# Patient Record
Sex: Female | Born: 1975 | Race: Black or African American | Hispanic: No | Marital: Single | Smoking: Never smoker
Health system: Southern US, Community
[De-identification: ages and names within clinical notes are randomized; demographics above are authoritative.]

## PROBLEM LIST (undated history)

## (undated) DIAGNOSIS — E785 Hyperlipidemia, unspecified: Secondary | ICD-10-CM

## (undated) DIAGNOSIS — F32A Depression, unspecified: Secondary | ICD-10-CM

## (undated) DIAGNOSIS — F329 Major depressive disorder, single episode, unspecified: Secondary | ICD-10-CM

## (undated) DIAGNOSIS — F419 Anxiety disorder, unspecified: Secondary | ICD-10-CM

## (undated) DIAGNOSIS — I10 Essential (primary) hypertension: Secondary | ICD-10-CM

## (undated) DIAGNOSIS — E119 Type 2 diabetes mellitus without complications: Secondary | ICD-10-CM

## (undated) HISTORY — DX: Essential (primary) hypertension: I10

## (undated) HISTORY — DX: Hyperlipidemia, unspecified: E78.5

## (undated) HISTORY — DX: Type 2 diabetes mellitus without complications: E11.9

## (undated) HISTORY — PX: OTHER SURGICAL HISTORY: SHX169

## (undated) HISTORY — DX: Major depressive disorder, single episode, unspecified: F32.9

## (undated) HISTORY — PX: TUBAL LIGATION: SHX77

## (undated) HISTORY — DX: Depression, unspecified: F32.A

## (undated) HISTORY — DX: Anxiety disorder, unspecified: F41.9

---

## 2009-04-21 ENCOUNTER — Emergency Department (HOSPITAL_COMMUNITY): Admission: EM | Admit: 2009-04-21 | Discharge: 2009-04-21 | Payer: Self-pay | Admitting: Emergency Medicine

## 2013-12-25 ENCOUNTER — Ambulatory Visit: Payer: Self-pay | Admitting: Family Medicine

## 2014-01-01 ENCOUNTER — Encounter: Payer: Self-pay | Admitting: Family Medicine

## 2014-01-01 ENCOUNTER — Ambulatory Visit (INDEPENDENT_AMBULATORY_CARE_PROVIDER_SITE_OTHER): Payer: 59 | Admitting: Family Medicine

## 2014-01-01 VITALS — BP 178/82 | HR 79 | Temp 98.2°F | Ht 63.0 in | Wt 222.5 lb

## 2014-01-01 DIAGNOSIS — E785 Hyperlipidemia, unspecified: Secondary | ICD-10-CM

## 2014-01-01 DIAGNOSIS — R945 Abnormal results of liver function studies: Secondary | ICD-10-CM

## 2014-01-01 DIAGNOSIS — Z136 Encounter for screening for cardiovascular disorders: Secondary | ICD-10-CM

## 2014-01-01 DIAGNOSIS — E119 Type 2 diabetes mellitus without complications: Secondary | ICD-10-CM

## 2014-01-01 DIAGNOSIS — I10 Essential (primary) hypertension: Secondary | ICD-10-CM

## 2014-01-01 DIAGNOSIS — R81 Glycosuria: Secondary | ICD-10-CM

## 2014-01-01 DIAGNOSIS — R7989 Other specified abnormal findings of blood chemistry: Secondary | ICD-10-CM

## 2014-01-01 LAB — COMPREHENSIVE METABOLIC PANEL
ALT: 104 U/L — AB (ref 0–35)
AST: 50 U/L — ABNORMAL HIGH (ref 0–37)
Albumin: 3.7 g/dL (ref 3.5–5.2)
Alkaline Phosphatase: 78 U/L (ref 39–117)
BILIRUBIN TOTAL: 0.4 mg/dL (ref 0.3–1.2)
BUN: 9 mg/dL (ref 6–23)
CALCIUM: 9.2 mg/dL (ref 8.4–10.5)
CO2: 26 mEq/L (ref 19–32)
Chloride: 100 mEq/L (ref 96–112)
Creatinine, Ser: 0.6 mg/dL (ref 0.4–1.2)
GFR: 141.03 mL/min (ref >60.00)
Glucose, Bld: 265 mg/dL — ABNORMAL HIGH (ref 70–99)
POTASSIUM: 3.9 meq/L (ref 3.5–5.1)
SODIUM: 136 meq/L (ref 135–145)
Total Protein: 7.6 g/dL (ref 6.0–8.3)

## 2014-01-01 LAB — LIPID PANEL
Cholesterol: 220 mg/dL — ABNORMAL HIGH (ref 0–200)
HDL: 57 mg/dL (ref >39.00)
LDL Cholesterol: 136 mg/dL — ABNORMAL HIGH (ref 0–99)
Total CHOL/HDL Ratio: 4
Triglycerides: 135 mg/dL (ref 0.0–149.0)
VLDL: 27 mg/dL (ref 0.0–40.0)

## 2014-01-01 LAB — HEMOGLOBIN A1C: HEMOGLOBIN A1C: 12.9 % — AB (ref 4.6–6.5)

## 2014-01-01 MED ORDER — LISINOPRIL-HYDROCHLOROTHIAZIDE 20-12.5 MG PO TABS: 1.0000 | ORAL_TABLET | Freq: Every day | ORAL | Status: DC

## 2014-01-01 NOTE — Assessment & Plan Note (Signed)
New.  Probable diabetic.  Go ahead and refer to diabetic nutritionist. Check a1c today before starting treatment as we may need to start with insulin if very high. The patient indicates understanding of these issues and agrees with the plan.

## 2014-01-01 NOTE — Progress Notes (Signed)
   Subjective:   Patient ID: Brittany [name], female    DOB: 03/17/1976, 38 y.o.   MRN: [id number]  Brittany [name] is a pleasant 38 y.o. year old female who presents to clinic today with Establish Care, Hypertension and glucose in urine  on 01/01/2014  HPI:  Went to UC on [address] 3 weeks for HA and dizziness.  BP was elevated and had glucosuria.  They told her to establish care with a PCP.  Strong FH of DM and HTN.  Today she does not have a HA.  She is not dizzy.  Overall feels ok today. Has had some increased urination but states that she drinks a lot of water.  Trying to work on diet- has already lost 8 pounds in 3 weeks.  Patient Active Problem List   Diagnosis Date Noted  . Glucosuria 01/01/2014  . HTN (hypertension) 01/01/2014   Past Medical History  Diagnosis Date  . Depression   . Hypertension UNSURE   Past Surgical History  Procedure Laterality Date  . Abdominal hysterectomy      2002   History  Substance Use Topics  . Smoking status: Never Smoker   . Smokeless tobacco: Never Used  . Alcohol Use: No   Family History  Problem Relation Age of Onset  . Diabetes Mother   . Hyperlipidemia Mother   . Diabetes Father   . Asthma Son   . Diabetes Paternal Grandmother   . Diabetes Paternal Grandfather   . Kidney disease Paternal Grandfather    No Known Allergies No current outpatient prescriptions on file prior to visit.   No current facility-administered medications on file prior to visit.   The PMH, PSH, Social History, Family History, Medications, and allergies have been reviewed in [facility], and have been updated if relevant.   Review of Systems See HPI No CP  No SOB No visual changes    Objective:    BP 178/82  Pulse 79  Temp(Src) 98.2 F (36.8 C) (Oral)  Ht 5\' 3"  (1.6 m)  Wt 222 lb 8 oz (100.925 kg)  BMI 39.42 kg/m2  SpO2 96%  LMP 12/07/2013   Physical Exam  Gen:  Obese, pleasant, NAD HEENT: MMM Resp:  CTA bilaterally CVS:   RRR Ext:  No edema      Assessment & Plan:   Glucosuria - Plan: Hemoglobin A1c  HTN (hypertension) - Plan: Comprehensive metabolic panel  Screening for ischemic heart disease - Plan: Lipid panel  Diabetes - Plan: Ambulatory referral to diabetic education Return in about 1 week (around 01/08/2014) for blood pressure recheck., medication follow up..Marland Kitchen

## 2014-01-01 NOTE — Assessment & Plan Note (Addendum)
Initially, BP was 201/100.  I rechecked it prior to giving a dose of clonidine and it had come down to 178/82.  Clonidine 0.1 mg given to her.  BP improved to 148/82.  Start lisinopril 20- HCTZ 12.5 mg daily. Check BMET. Follow up in 1 weeks.

## 2014-01-01 NOTE — Progress Notes (Signed)
Pre visit review using our clinic review tool, if applicable. No additional management support is needed unless otherwise documented below in the visit note. 

## 2014-01-01 NOTE — Patient Instructions (Signed)
Great to meet you. We are starting lisinopril- HCTZ- 1 tablet daily.   Come see in 1 week.  I will call you with your lab results.

## 2014-01-02 ENCOUNTER — Telehealth: Payer: Self-pay | Admitting: Family Medicine

## 2014-01-02 ENCOUNTER — Encounter: Payer: Self-pay | Admitting: Family Medicine

## 2014-01-02 DIAGNOSIS — R7989 Other specified abnormal findings of blood chemistry: Secondary | ICD-10-CM | POA: Insufficient documentation

## 2014-01-02 DIAGNOSIS — R945 Abnormal results of liver function studies: Secondary | ICD-10-CM

## 2014-01-02 DIAGNOSIS — E785 Hyperlipidemia, unspecified: Secondary | ICD-10-CM

## 2014-01-02 HISTORY — DX: Hyperlipidemia, unspecified: E78.5

## 2014-01-02 MED ORDER — ONETOUCH BASIC SYSTEM W/DEVICE KIT
PACK | Status: DC
Start: 2014-01-02 — End: 2014-01-05

## 2014-01-02 MED ORDER — METFORMIN HCL 500 MG PO TABS: 500.0000 mg | ORAL_TABLET | Freq: Two times a day (BID) | ORAL | Status: DC

## 2014-01-02 MED ORDER — SIMVASTATIN 10 MG PO TABS: 10.0000 mg | ORAL_TABLET | Freq: Every day | ORAL | Status: DC

## 2014-01-02 NOTE — Addendum Note (Signed)
Addended by: Dianne DunARON, Yilia Sacca M on: 01/02/2014 07:01 AM   Modules accepted: Orders

## 2014-01-02 NOTE — Telephone Encounter (Signed)
Relevant patient education assigned to patient using Emmi. ° °

## 2014-01-05 ENCOUNTER — Ambulatory Visit (INDEPENDENT_AMBULATORY_CARE_PROVIDER_SITE_OTHER): Payer: 59 | Admitting: Family Medicine

## 2014-01-05 ENCOUNTER — Encounter: Payer: Self-pay | Admitting: Family Medicine

## 2014-01-05 VITALS — BP 136/72 | HR 105 | Temp 97.4°F | Wt 218.5 lb

## 2014-01-05 DIAGNOSIS — R197 Diarrhea, unspecified: Secondary | ICD-10-CM

## 2014-01-05 DIAGNOSIS — R112 Nausea with vomiting, unspecified: Secondary | ICD-10-CM | POA: Insufficient documentation

## 2014-01-05 MED ORDER — ONDANSETRON HCL 4 MG PO TABS: 4.0000 mg | ORAL_TABLET | Freq: Three times a day (TID) | ORAL | Status: DC | PRN

## 2014-01-05 MED ORDER — ONETOUCH BASIC SYSTEM W/DEVICE KIT: PACK | Status: DC

## 2014-01-05 NOTE — Patient Instructions (Addendum)
Viral Gastroenteritis °Viral gastroenteritis is also known as stomach flu. This condition affects the stomach and intestinal tract. It can cause sudden diarrhea and vomiting. The illness typically lasts 3 to 8 days. Most people develop an immune response that eventually gets rid of the virus. While this natural response develops, the virus can make you quite ill. °CAUSES  °Many different viruses can cause gastroenteritis, such as rotavirus or noroviruses. You can catch one of these viruses by consuming contaminated food or water. You may also catch a virus by sharing utensils or other personal items with an infected person or by touching a contaminated surface. °SYMPTOMS  °The most common symptoms are diarrhea and vomiting. These problems can cause a severe loss of body fluids (dehydration) and a body salt (electrolyte) imbalance. Other symptoms may include: °· Fever. °· Headache. °· Fatigue. °· Abdominal pain. °DIAGNOSIS  °Your caregiver can usually diagnose viral gastroenteritis based on your symptoms and a physical exam. A stool sample may also be taken to test for the presence of viruses or other infections. °TREATMENT  °This illness typically goes away on its own. Treatments are aimed at rehydration. The most serious cases of viral gastroenteritis involve vomiting so severely that you are not able to keep fluids down. In these cases, fluids must be given through an intravenous line (IV). °HOME CARE INSTRUCTIONS  °· Drink enough fluids to keep your urine clear or pale yellow. Drink small amounts of fluids frequently and increase the amounts as tolerated. °· Ask your caregiver for specific rehydration instructions. °· Avoid: °¨ Foods high in sugar. °¨ Alcohol. °¨ Carbonated drinks. °¨ Tobacco. °¨ Juice. °¨ Caffeine drinks. °¨ Extremely hot or cold fluids. °¨ Fatty, greasy foods. °¨ Too much intake of anything at one time. °¨ Dairy products until 24 to 48 hours after diarrhea stops. °· You may consume probiotics.  Probiotics are active cultures of beneficial bacteria. They may lessen the amount and number of diarrheal stools in adults. Probiotics can be found in yogurt with active cultures and in supplements. °· Wash your hands well to avoid spreading the virus. °· Only take over-the-counter or prescription medicines for pain, discomfort, or fever as directed by your caregiver. Do not give aspirin to children. Antidiarrheal medicines are not recommended. °· Ask your caregiver if you should continue to take your regular prescribed and over-the-counter medicines. °· Keep all follow-up appointments as directed by your caregiver. °SEEK IMMEDIATE MEDICAL CARE IF:  °· You are unable to keep fluids down. °· You do not urinate at least once every 6 to 8 hours. °· You develop shortness of breath. °· You notice blood in your stool or vomit. This may look like coffee grounds. °· You have abdominal pain that increases or is concentrated in one small area (localized). °· You have persistent vomiting or diarrhea. °· You have a fever. °· The patient is a child younger than 3 months, and he or she has a fever. °· The patient is a child older than 3 months, and he or she has a fever and persistent symptoms. °· The patient is a child older than 3 months, and he or she has a fever and symptoms suddenly get worse. °· The patient is a baby, and he or she has no tears when crying. °MAKE SURE YOU:  °· Understand these instructions. °· Will watch your condition. °· Will get help right away if you are not doing well or get worse. °Document Released: 09/07/2005 Document Revised: 11/30/2011 Document Reviewed: 06/24/2011 °  ExitCare Patient Information 2014 [employer] [place].  Please stop taking your Metformin this weekend. Follow up with me on Wednesday, sooner if you feel worse.

## 2014-01-05 NOTE — Assessment & Plan Note (Signed)
Seems more likely infectious gastroenteritis. Advised hydration.  Go ahead and continue to hold Metformin since she is dehydrated.  She has follow up with me on Wednesday.  Ok to restart Metformin on Monday if symptoms resolved. The patient indicates understanding of these issues and agrees with the plan.

## 2014-01-05 NOTE — Progress Notes (Signed)
   Subjective:   Patient ID: Brittany [name], female    DOB: 10/30/1975, 38 y.o.   MRN: [id number]  Brittany [name] is a pleasant 38 y.o. year old female who presents to clinic today with Nausea and Diarrhea  on 01/05/2014  HPI: Started Metformin 500 mg twice daily yesterday.   Started feeling nauseated, no vomiting yet, and diarrhea yesterday afternoon.  Did not take Metformin today.   Feels worse today.  Has had 5 loose BMs today. Coworker has similar symptoms.   No blood or mucous in stool.  Has not checked FSBS yet.  Patient Active Problem List   Diagnosis Date Noted  . Nausea, vomiting, and diarrhea 01/05/2014  . Diabetes mellitus, new onset 01/02/2014  . HLD (hyperlipidemia) 01/02/2014  . Elevated liver function tests 01/02/2014  . Glucosuria 01/01/2014  . HTN (hypertension) 01/01/2014   Past Medical History  Diagnosis Date  . Depression   . Hypertension UNSURE  . HLD (hyperlipidemia) 01/02/2014   Past Surgical History  Procedure Laterality Date  . Abdominal hysterectomy      2002   History  Substance Use Topics  . Smoking status: Never Smoker   . Smokeless tobacco: Never Used  . Alcohol Use: No   Family History  Problem Relation Age of Onset  . Diabetes Mother   . Hyperlipidemia Mother   . Diabetes Father   . Asthma Son   . Diabetes Paternal Grandmother   . Diabetes Paternal Grandfather   . Kidney disease Paternal Grandfather    No Known Allergies Current Outpatient Prescriptions on File Prior to Visit  Medication Sig Dispense Refill  . lisinopril-hydrochlorothiazide (ZESTORETIC) 20-12.5 MG per tablet Take 1 tablet by mouth daily.  30 tablet  0  . metFORMIN (GLUCOPHAGE) 500 MG tablet Take 1 tablet (500 mg total) by mouth 2 (two) times daily with a meal.  180 tablet  3  . simvastatin (ZOCOR) 10 MG tablet Take 1 tablet (10 mg total) by mouth at bedtime.  90 tablet  3   No current facility-administered medications on file prior to visit.   The PMH, PSH,  Social History, Family History, Medications, and allergies have been reviewed in [facility], and have been updated if relevant.  Review of Systems     Objective:    BP 136/72  Pulse 105  Temp(Src) 97.4 F (36.3 C) (Oral)  Wt 218 lb 8 oz (99.111 kg)  SpO2 96%  LMP 12/07/2013   Physical Exam Gen:  Alert, pleasant, NAD  Abd:  Soft, NT, pos BS     Assessment & Plan:   Nausea, vomiting, and diarrhea No Follow-up on file.

## 2014-01-05 NOTE — Progress Notes (Signed)
Pre visit review using our clinic review tool, if applicable. No additional management support is needed unless otherwise documented below in the visit note. 

## 2014-01-08 ENCOUNTER — Telehealth: Payer: Self-pay | Admitting: Family Medicine

## 2014-01-08 DIAGNOSIS — Z0279 Encounter for issue of other medical certificate: Secondary | ICD-10-CM

## 2014-01-08 MED ORDER — GLUCOSE BLOOD VI STRP: ORAL_STRIP | Status: DC

## 2014-01-08 MED ORDER — ONETOUCH ULTRASOFT LANCETS MISC: Status: DC

## 2014-01-08 NOTE — Telephone Encounter (Signed)
Pt dropped off FMLA forms for Dr. Dayton MartesAron to complete. Papers are in orange folder. Please return to [place] when complete.

## 2014-01-08 NOTE — Telephone Encounter (Signed)
Spoke to pt and informed her Rx has been sent to requested pharmacy 

## 2014-01-08 NOTE — Telephone Encounter (Signed)
Form completed and signed; [facility]

## 2014-01-08 NOTE — Telephone Encounter (Signed)
Ok to send as requested- check FSBS three times weekly.

## 2014-01-08 NOTE — Telephone Encounter (Signed)
Pt notified that forms were faxed and a copy is ready for her up front.

## 2014-01-08 NOTE — Telephone Encounter (Signed)
Pt left v/m; pt was seen 01/05/14, pt is feeling better from stomach bug.pt also request diabetic test strips and lancets to [employer] [facility] [place]. Pt received one touch basic meter on 01/05/14.

## 2014-01-10 ENCOUNTER — Telehealth: Payer: Self-pay | Admitting: Family Medicine

## 2014-01-10 ENCOUNTER — Ambulatory Visit (INDEPENDENT_AMBULATORY_CARE_PROVIDER_SITE_OTHER): Payer: 59 | Admitting: Family Medicine

## 2014-01-10 ENCOUNTER — Encounter: Payer: Self-pay | Admitting: Family Medicine

## 2014-01-10 VITALS — BP 136/88 | HR 58 | Temp 97.7°F | Wt 218.8 lb

## 2014-01-10 DIAGNOSIS — E119 Type 2 diabetes mellitus without complications: Secondary | ICD-10-CM

## 2014-01-10 DIAGNOSIS — R112 Nausea with vomiting, unspecified: Secondary | ICD-10-CM

## 2014-01-10 DIAGNOSIS — R197 Diarrhea, unspecified: Secondary | ICD-10-CM

## 2014-01-10 LAB — BASIC METABOLIC PANEL
BUN: 14 mg/dL (ref 6–23)
CO2: 30 mEq/L (ref 19–32)
Calcium: 8.9 mg/dL (ref 8.4–10.5)
Chloride: 98 mEq/L (ref 96–112)
Creatinine, Ser: 0.7 mg/dL (ref 0.4–1.2)
GFR: 120.3 mL/min (ref >60.00)
GLUCOSE: 294 mg/dL — AB (ref 70–99)
POTASSIUM: 3.8 meq/L (ref 3.5–5.1)
Sodium: 135 mEq/L (ref 135–145)

## 2014-01-10 LAB — MICROALBUMIN / CREATININE URINE RATIO
Creatinine,U: 147.9 mg/dL
Microalb Creat Ratio: 10.5 mg/g (ref 0.0–30.0)
Microalb, Ur: 15.6 mg/dL — ABNORMAL HIGH (ref 0.0–1.9)

## 2014-01-10 NOTE — Progress Notes (Signed)
Pre visit review using our clinic review tool, if applicable. No additional management support is needed unless otherwise documented below in the visit note. 

## 2014-01-10 NOTE — Telephone Encounter (Signed)
Patient Information:  Caller Name: Brittany [name]  Phone: [phone]  Patient: Brittany [name], Brittany [name]  Gender: Female  DOB: 12/02/1975  Age: 38 Years  PCP: Brittany [name], [facility](Family Practice)  Pregnant: No  Office Follow Up:  Does the office need to follow up with this patient?: No  Instructions For The Office: N/A  RN Note:  Patient states she was seen in the office 01/10/14 for a follow up visit from 01/05/14 for diarrhea. Patient states she has not had diarrhea since 01/06/14. Patient states she felt lightheaded, dizzy and shakey at approx. [id number] 01/10/14. Patient states she has Diabetes and had not eaten. Patient states she drank orange juice and ate a snack at approx. [id number] 01/10/14 without improvement of sx. Patient states she checked her blood glucose at 0930 01/10/14 and blood sugar was 380. Patient also states her blood glucose was 370 at 0730 01/10/14. Patient denies nausea or vomiting. RN reviewed Epic Electronic Health Record -Urine Microalbumin pending.  RN spoke with Brittany [name], in office, for above information to be given to      Dr. Dayton MartesAron. Brittany [name] states, per order of Dr. Dayton MartesAron: Advise patient to take an extra dose of Metformin today (01/10/14). Advise patient to return call if she is not feeling better. Patient to be seen in office 01/11/14 if no improvement. Patient informed of above. Patient advised to return call if no improvement or if sx increase. Patient advised not to drive. Patient advised to increase water intake. Call back parameters reviewed. Patient verbalizes understanding.  Symptoms  Reason For Call & Symptoms: Lightheaded, shakey  Reviewed Health History In EMR: Yes  Reviewed Medications In EMR: Yes  Reviewed Allergies In EMR: Yes  Reviewed Surgeries / Procedures: Yes  Date of Onset of Symptoms: 01/10/2014  Treatments Tried: Orange juice and snack  Treatments Tried Worked: No OB / GYN:  LMP: 01/08/2014  Guideline(s) Used:  Diabetes - High Blood Sugar  Disposition Per  Guideline:   Discuss with PCP and Callback by Nurse within 1 Hour  Reason For Disposition Reached:   Blood glucose > 300 mg/dl (65.716.5 mmol/l) AND two or more times in a row  Advice Given:  Treatment - Liquids  Drink at least one glass (8 oz or 240 ml) of water per hour for the next 4 hours. (Reason: adequate hydration will reduce hyperglycemia).  Generally, you should try to drink 6-8 glasses of water each day.  Call Back If:  Blood glucose more than 300 mg/dL (84.616.5 mmol/l), 2 or more times in a row.  Urine ketones become moderate or large  Vomiting lasting more than 4 hours or unable to drink any liquids.  Rapid breathing occurs  You become worse.  Patient Will Follow Care Advice:  YES

## 2014-01-10 NOTE — Progress Notes (Signed)
Subjective:   Patient ID: Brittany [name], female    DOB: Jan 29, 1976, 38 y.o.   MRN: [id number]  Brittany [name] is a pleasant 38 y.o. year old female who presents to clinic today with Follow-up and Diabetes  on 01/10/2014  HPI: New onset DM.  Lab Results  Component Value Date   HGBA1C 12.9* 01/01/2014    Started Metformin 500 mg twice daily 01/04/2014.  Started feeling nauseated, no vomiting yet, and diarrhea the next day.   Saw her on 4/17 for these symptoms.  ? Gastroenteritis (co worker had similar symptoms) vs side effects from metformin.  Advised to hold her Metformin for a couple of days and restart it Monday.  Feels better today. Nausea and diarrhea have resolved.  FSBS remain elevated- was 280 after dinner last night.   Lowest it has been is 215.  Patient Active Problem List   Diagnosis Date Noted  . Nausea, vomiting, and diarrhea 01/05/2014  . Diabetes mellitus, new onset 01/02/2014  . HLD (hyperlipidemia) 01/02/2014  . Elevated liver function tests 01/02/2014  . Glucosuria 01/01/2014  . HTN (hypertension) 01/01/2014   Past Medical History  Diagnosis Date  . Depression   . Hypertension UNSURE  . HLD (hyperlipidemia) 01/02/2014   Past Surgical History  Procedure Laterality Date  . Abdominal hysterectomy      2002   History  Substance Use Topics  . Smoking status: Never Smoker   . Smokeless tobacco: Never Used  . Alcohol Use: No   Family History  Problem Relation Age of Onset  . Diabetes Mother   . Hyperlipidemia Mother   . Diabetes Father   . Asthma Son   . Diabetes Paternal Grandmother   . Diabetes Paternal Grandfather   . Kidney disease Paternal Grandfather    No Known Allergies Current Outpatient Prescriptions on File Prior to Visit  Medication Sig Dispense Refill  . Blood Glucose Monitoring Suppl ([place]) W/DEVICE KIT Use as directed by diabetic nutritionist and MD  1 each  0  . glucose blood (ONE TOUCH ULTRA TEST) test strip  Use as instructed  100 each  12  . Lancets (ONETOUCH ULTRASOFT) lancets Use as instructed  100 each  12  . lisinopril-hydrochlorothiazide (ZESTORETIC) 20-12.5 MG per tablet Take 1 tablet by mouth daily.  30 tablet  0  . metFORMIN (GLUCOPHAGE) 500 MG tablet Take 1 tablet (500 mg total) by mouth 2 (two) times daily with a meal.  180 tablet  3  . ondansetron (ZOFRAN) 4 MG tablet Take 1 tablet (4 mg total) by mouth every 8 (eight) hours as needed for nausea or vomiting.  20 tablet  0  . simvastatin (ZOCOR) 10 MG tablet Take 1 tablet (10 mg total) by mouth at bedtime.  90 tablet  3   No current facility-administered medications on file prior to visit.   The PMH, PSH, Social History, Family History, Medications, and allergies have been reviewed in [facility], and have been updated if relevant.  Review of Systems    See HPI No abdominal pain Denies any episodes of hypoglycemia No tingling in extremities Objective:    BP 136/88  Pulse 58  Temp(Src) 97.7 F (36.5 C) (Oral)  Wt 218 lb 12 oz (99.224 kg)  SpO2 98%  LMP 01/08/2014   Physical Exam Gen:  Alert, pleasant, NAD Abd:  Soft, NT, pos BS Ext:  Diabetic foot exam: Normal inspection No skin breakdown No calluses  Normal DP pulses Normal sensation to light touch  and monofilament Nails normal      Assessment & Plan:   Diabetes mellitus, new onset - Plan: [facility] DIABETES FOOT EXAM, Basic metabolic panel, Microalbumin / creatinine urine ratio  Nausea, vomiting, and diarrhea No Follow-up on file.   

## 2014-01-10 NOTE — Assessment & Plan Note (Signed)
Resolved. ?viral gastroenteritis.

## 2014-01-10 NOTE — Assessment & Plan Note (Signed)
FSBS remain elevated but just restarted Metformin 2 days ago. Has not yet started diabetic teaching. Continue current dose of Metformin for at least another week. Continue checking FSBS 2-3 times daily and she will call me with her glucose readings next week.  We will likely increase dose of Metformin at that time. The patient indicates understanding of these issues and agrees with the plan. Orders Placed This Encounter  Procedures  . Basic metabolic panel  . Microalbumin / creatinine urine ratio  . [facility] DIABETES FOOT EXAM

## 2014-01-10 NOTE — Telephone Encounter (Signed)
Advised to increase metformin dosage today, per Dr Dayton MartesAron

## 2014-01-10 NOTE — Patient Instructions (Signed)
Great to see you. Please keep checking your blood sugars. Call me next week with your readings.  Let's continue current dose of metformin for now.

## 2014-01-15 ENCOUNTER — Telehealth: Payer: Self-pay

## 2014-01-15 NOTE — Telephone Encounter (Signed)
Relevant patient education assigned to patient using Emmi. ° °

## 2014-01-16 ENCOUNTER — Telehealth: Payer: Self-pay

## 2014-01-16 NOTE — Telephone Encounter (Signed)
Pt called back; Metformin started nausea and stomach cramps since 01/10/14. Pt stopped med after 01/10/14 visit as instructed. Pt restarted Metformin and nausea and stomach cramps came back. On 01/15/14 pt had N&V and stomach cramps ; no fever, diarrhea and no UTI symptoms. Pt said after taking Metformin the N&V and stomach cramps get worse. Pt last vomited 1 pm. Pt said only med pt takes for diabetes is metformin; pt takes metformin 500 mg twice a day at meal time. Pts average FBS runs in 200s. Dr Dayton MartesAron out of office and pt request cb. Pt will call back or go to UC if symptoms worsen prior to cb.

## 2014-01-16 NOTE — Telephone Encounter (Signed)
Metformin can cause these symptoms when started. Would suggest one last trial of metformin with very slow taper up - start at 1/2 tablet nightly for 1 week then increase to 1 tablet nightly for 1 week and call us with update. If doesn't desire to do this, will need to try another diabetes med and I will send this note to PCP as well.

## 2014-01-16 NOTE — Telephone Encounter (Signed)
Pt left v/m; Metformin causing pt to be extremely nauseated and stomach cramps. Also pt has questions about form recently filled out. Left v/m for pt to cb.

## 2014-01-16 NOTE — Telephone Encounter (Signed)
Message left advising patient. Advised to call with update or if she doesn't want to continue Metformin and wants a different med.

## 2014-01-16 NOTE — Telephone Encounter (Signed)
Pt picked up FMLA; when pt spoke with HR dept; pt is covered for 5 appts, FMLA did not cover pt having to leave work on 01/12/14 and 01/15/14 due to ? Reaction to metformin with N&V and stomach cramping. Pt wants to know if FMLA can be amended so pt will have more coverage for being out of work? Pt request cb.

## 2014-01-17 ENCOUNTER — Encounter: Payer: Self-pay | Admitting: *Deleted

## 2014-01-17 NOTE — Telephone Encounter (Signed)
Yes ok to change with dates she is requesting.

## 2014-01-17 NOTE — Telephone Encounter (Signed)
Spoke to pt and advised her to bring paperwork back to office for review

## 2014-01-18 NOTE — Telephone Encounter (Signed)
Pt left v/m; pt said the last page of form needs to be checked yes; pts employer does not accept writtten excuse. Pt request cb.

## 2014-01-19 NOTE — Telephone Encounter (Signed)
Spoke to pt and informed her to bring new paperwork to the office BLANK. She should not fill out information on the form that is to be completed by the dr office as she previously did.

## 2014-01-30 ENCOUNTER — Other Ambulatory Visit: Payer: Self-pay | Admitting: *Deleted

## 2014-01-30 MED ORDER — LISINOPRIL-HYDROCHLOROTHIAZIDE 20-12.5 MG PO TABS: 1.0000 | ORAL_TABLET | Freq: Every day | ORAL | Status: DC

## 2014-03-06 ENCOUNTER — Encounter: Payer: 59 | Attending: Family Medicine

## 2014-03-06 VITALS — Ht 62.0 in | Wt 218.6 lb

## 2014-03-06 DIAGNOSIS — Z713 Dietary counseling and surveillance: Secondary | ICD-10-CM | POA: Insufficient documentation

## 2014-03-06 DIAGNOSIS — E119 Type 2 diabetes mellitus without complications: Secondary | ICD-10-CM | POA: Diagnosis not present

## 2014-03-13 DIAGNOSIS — E119 Type 2 diabetes mellitus without complications: Secondary | ICD-10-CM

## 2014-03-14 NOTE — Progress Notes (Signed)
Patient was seen on 03/06/14 for the first of a series of three diabetes self-management courses at [facility].  Current HbA1c: 12.9%  The following learning objectives were met by the patient during this class:  Describe diabetes  State some common risk factors for diabetes  Defines the role of glucose and insulin  Identifies type of diabetes and pathophysiology  Describe the relationship between diabetes and cardiovascular risk  State the members of the Healthcare Team  States the rationale for glucose monitoring  State when to test glucose  State their individual Target Range  State the importance of logging glucose readings  Describe how to interpret glucose readings  Identifies A1C target  Explain the correlation between A1c and eAG values  State symptoms and treatment of high blood glucose  State symptoms and treatment of low blood glucose  Explain proper technique for glucose testing  Identifies proper sharps disposal  Handouts given during class include:  Living Well with Diabetes book  Carb Counting and Meal Planning book  Meal Plan Card  Carbohydrate guide  Meal planning worksheet  Low Sodium Flavoring Tips  The diabetes portion plate  V8A to eAG Conversion Chart  Diabetes Medications  Diabetes Recommended Care Schedule  Support Group  Diabetes Success Plan  Core Class Satisfaction Survey  Follow-Up Plan:  Attend core 2

## 2014-03-19 NOTE — Progress Notes (Signed)

## 2014-03-20 ENCOUNTER — Ambulatory Visit: Payer: 59

## 2014-04-30 ENCOUNTER — Ambulatory Visit: Payer: 59 | Admitting: Family Medicine

## 2014-05-01 ENCOUNTER — Ambulatory Visit (INDEPENDENT_AMBULATORY_CARE_PROVIDER_SITE_OTHER): Payer: 59 | Admitting: Family Medicine

## 2014-05-01 ENCOUNTER — Encounter: Payer: Self-pay | Admitting: Family Medicine

## 2014-05-01 VITALS — BP 129/80 | HR 84 | Ht 63.0 in | Wt 216.0 lb

## 2014-05-01 DIAGNOSIS — E1165 Type 2 diabetes mellitus with hyperglycemia: Secondary | ICD-10-CM | POA: Insufficient documentation

## 2014-05-01 DIAGNOSIS — R945 Abnormal results of liver function studies: Secondary | ICD-10-CM

## 2014-05-01 DIAGNOSIS — B373 Candidiasis of vulva and vagina: Secondary | ICD-10-CM

## 2014-05-01 DIAGNOSIS — E118 Type 2 diabetes mellitus with unspecified complications: Secondary | ICD-10-CM

## 2014-05-01 DIAGNOSIS — I10 Essential (primary) hypertension: Secondary | ICD-10-CM

## 2014-05-01 DIAGNOSIS — IMO0002 Reserved for concepts with insufficient information to code with codable children: Secondary | ICD-10-CM | POA: Insufficient documentation

## 2014-05-01 DIAGNOSIS — B3731 Acute candidiasis of vulva and vagina: Secondary | ICD-10-CM

## 2014-05-01 DIAGNOSIS — E119 Type 2 diabetes mellitus without complications: Secondary | ICD-10-CM

## 2014-05-01 DIAGNOSIS — R7989 Other specified abnormal findings of blood chemistry: Secondary | ICD-10-CM

## 2014-05-01 DIAGNOSIS — E785 Hyperlipidemia, unspecified: Secondary | ICD-10-CM

## 2014-05-01 MED ORDER — SITAGLIP PHOS-METFORMIN HCL ER 50-1000 MG PO TB24: 1.0000 | ORAL_TABLET | Freq: Two times a day (BID) | ORAL | Status: DC

## 2014-05-01 MED ORDER — FLUCONAZOLE 150 MG PO TABS: ORAL_TABLET | ORAL | Status: AC

## 2014-05-01 NOTE — Progress Notes (Signed)
CC: Brittany [name] is a 38 y.o. female is here for Establish Care   Very pleasant 38year-old here to establish care  Reports a history of type 2 diabetes that was diagnosed earlier this year. She has tried metformin at various dosing schedules however all regimens have caused intolerable diarrhea and abdominal pain. This resolved after stopping metformin. She has not been on any antihyperglycemic medication for matter of months now. Review of systems positive for polyuria polyphagia and polydipsia. Additionally positive for thin vaginal secretion which is itchy smells like bread which improves with over-the-counter Monistat only for matter of weeks and then returns. No outside blood sugars to report.  Earlier this year she was also diagnosed with hyperlipidemia and started on Zocor. She continues to take this on a daily basis without side effects. Denies myalgias or right upper quadrant pain. Cholesterol has not been checked since this was initially started.  On blood work back in the spring she was found to have moderately elevated liver enzymes she tells me that she's never been aware of this in the past prior to this blood draw. She denies recent or remote jaundice or scleral icterus  She was also diagnosed with hypertension back in the spring has been taking lisinopril/hydrochlorothiazide on a daily basis with no known intolerance of side effects. She takes her blood pressure a few times a week and blood pressures have consistently been below 140/90  Review of Systems - General ROS: negative for - chills, fever, night sweats, weight gain or weight loss Ophthalmic ROS: negative for - decreased vision Psychological ROS: negative for - anxiety or depression ENT ROS: negative for - hearing change, nasal congestion, tinnitus or allergies Hematological and Lymphatic ROS: negative for - bleeding problems, bruising or swollen lymph nodes Breast ROS: negative Respiratory ROS: no cough, shortness of  breath, or wheezing Cardiovascular ROS: no chest pain or dyspnea on exertion Gastrointestinal ROS: no abdominal pain, change in bowel habits, or black or bloody stools Genito-Urinary ROS: negative for -genital ulcers, incontinence or abnormal bleeding from genitals Musculoskeletal ROS: negative for - joint pain or muscle pain Neurological ROS: negative for -  memory loss Dermatological ROS: negative for lumps, mole changes, rash and skin lesion changes  Past Medical History  Diagnosis Date  . Depression   . Hypertension UNSURE  . HLD (hyperlipidemia) 01/02/2014  . Diabetes mellitus without complication     Past Surgical History  Procedure Laterality Date  . Tubal ligation     Family History  Problem Relation Age of Onset  . Diabetes Mother   . Hyperlipidemia Mother   . Diabetes Father   . Asthma Son   . Diabetes Paternal Grandmother   . Diabetes Paternal Grandfather   . Kidney disease Paternal Grandfather     History   Social History  . Marital Status: Single    Spouse Name: N/A    Number of Children: N/A  . Years of Education: N/A   Occupational History  . Not on file.   Social History Main Topics  . Smoking status: Never Smoker   . Smokeless tobacco: Never Used  . Alcohol Use: No  . Drug Use: No  . Sexual Activity: Yes    Birth Control/ Protection: None, Surgical   Other Topics Concern  . Not on file   Social History Narrative  . No narrative on file     Objective: BP 129/80  Pulse 84  Ht 5\' 3"  (1.6 m)  Wt 216 lb (97.977 kg)  BMI 38.27 kg/m2  General: Alert and Oriented, No Acute Distress HEENT: Pupils equal, round, reactive to light. Conjunctivae clear.  Moist membranes pharynx unremarkable Lungs: Clear to auscultation bilaterally, no wheezing/ronchi/rales.  Comfortable work of breathing. Good air movement. Cardiac: Regular rate and rhythm. Normal S1/S2.  No murmurs, rubs, nor gallops.   Abdomen: Obese and soft Extremities: No peripheral edema.   Strong peripheral pulses.  Mental Status: No depression, anxiety, nor agitation. Skin: Warm and dry.  Assessment & Plan: Brittany [name] was seen today for establish care.  Diagnoses and associated orders for this visit:  Type 2 diabetes mellitus without complication - Lipid panel - COMPLETE METABOLIC PANEL WITH GFR - Hemoglobin A1c - SitaGLIPtin-MetFORMIN HCl (JANUMET XR) 50-1000 MG TB24; Take 1 tablet by mouth 2 (two) times daily with a meal. For blood sugar control.  HLD (hyperlipidemia) - Lipid panel  Essential hypertension - COMPLETE METABOLIC PANEL WITH GFR  Elevated liver function tests - COMPLETE METABOLIC PANEL WITH GFR - US Abdomen Complete; Future  Vagina, candidiasis - fluconazole (DIFLUCAN) 150 MG tablet; Take one tab, may take second tab if no improvement after 72 hours.    Type 2 diabetes: Clinically uncontrolled checking baseline A1c and starting Janumet XR. She was provided with 14 days worth of samples and have asked her to call me if tolerated for a formal prescription.  Hyperlipidemia: Due for recheck of lipid panel, discussed with her that if we can get her blood sugar under better control we could consider stopping simvastatin based on current ACC guidelines of her not being 38 years old as yet as long as her 10 year risk remains below 7.5%. For now continue simvastatin, checking liver enzymes Elevated liver function tests: Ultrasound abdomen ordered Start Diflucan for vaginal candidiasis that should improve with better blood sugar control Essential hypertension: Controlled continue lisinopril and hydrochlorothiazide   Return in about 4 weeks (around 05/29/2014) for Diabetic F/U.

## 2014-05-01 NOTE — Patient Instructions (Addendum)
Call in one to two weeks if you're tolerating Janumet samples so I can call in a formal Rx, hold on the the savings card I gave you today. Someone will call you in the next few days about scheduling a ultrasound of the abdomen.

## 2014-05-14 ENCOUNTER — Telehealth: Payer: Self-pay

## 2014-05-14 NOTE — Telephone Encounter (Signed)
Brittany [name] [facility] left v/m requesting refill Lisinopril HCTZ; spoke with Brittany [name] and advised PCP changed to [name] DO [phone]. Brittany [name] will contact for refill request.

## 2014-05-16 ENCOUNTER — Encounter: Payer: Self-pay | Admitting: Family Medicine

## 2014-05-16 ENCOUNTER — Other Ambulatory Visit: Payer: Self-pay | Admitting: *Deleted

## 2014-05-16 ENCOUNTER — Ambulatory Visit (INDEPENDENT_AMBULATORY_CARE_PROVIDER_SITE_OTHER): Payer: 59

## 2014-05-16 DIAGNOSIS — R7989 Other specified abnormal findings of blood chemistry: Secondary | ICD-10-CM

## 2014-05-16 DIAGNOSIS — R945 Abnormal results of liver function studies: Principal | ICD-10-CM

## 2014-05-16 DIAGNOSIS — K760 Fatty (change of) liver, not elsewhere classified: Secondary | ICD-10-CM | POA: Insufficient documentation

## 2014-05-16 MED ORDER — LISINOPRIL-HYDROCHLOROTHIAZIDE 20-12.5 MG PO TABS: 1.0000 | ORAL_TABLET | Freq: Every day | ORAL | Status: DC

## 2014-05-16 MED ORDER — SIMVASTATIN 10 MG PO TABS: 10.0000 mg | ORAL_TABLET | Freq: Every day | ORAL | Status: DC

## 2014-05-16 NOTE — Telephone Encounter (Signed)
Pt requests a refill on lisinopril-HCTZ and simvastatin. I see in the note that you wanted to her to continue simvastatin but I'm fowarding this this refill request to you since you have not filled these medications for her yet.

## 2014-05-16 NOTE — Telephone Encounter (Signed)
Both Rxs approved and sent.

## 2014-05-17 ENCOUNTER — Telehealth: Payer: Self-pay | Admitting: Family Medicine

## 2014-05-17 DIAGNOSIS — E119 Type 2 diabetes mellitus without complications: Secondary | ICD-10-CM

## 2014-05-17 LAB — COMPLETE METABOLIC PANEL WITH GFR
ALK PHOS: 64 U/L (ref 39–117)
ALT: 98 U/L — ABNORMAL HIGH (ref 0–35)
AST: 52 U/L — ABNORMAL HIGH (ref 0–37)
Albumin: 4 g/dL (ref 3.5–5.2)
BUN: 13 mg/dL (ref 6–23)
CHLORIDE: 99 meq/L (ref 96–112)
CO2: 31 mEq/L (ref 19–32)
CREATININE: 0.66 mg/dL (ref 0.50–1.10)
Calcium: 9.2 mg/dL (ref 8.4–10.5)
GFR, Est African American: >89 mL/min
GFR, Est Non African American: >89 mL/min
Glucose, Bld: 276 mg/dL — ABNORMAL HIGH (ref 70–99)
Potassium: 4 mEq/L (ref 3.5–5.3)
Sodium: 138 mEq/L (ref 135–145)
Total Bilirubin: 0.4 mg/dL (ref 0.2–1.2)
Total Protein: 6.8 g/dL (ref 6.0–8.3)

## 2014-05-17 LAB — HEMOGLOBIN A1C
HEMOGLOBIN A1C: 12.8 % — AB (ref <5.7)
MEAN PLASMA GLUCOSE: 321 mg/dL — AB (ref <117)

## 2014-05-17 LAB — LIPID PANEL
CHOLESTEROL: 186 mg/dL (ref 0–200)
HDL: 55 mg/dL (ref >39)
LDL Cholesterol: 109 mg/dL — ABNORMAL HIGH (ref 0–99)
TRIGLYCERIDES: 109 mg/dL (ref <150)
Total CHOL/HDL Ratio: 3.4 Ratio
VLDL: 22 mg/dL (ref 0–40)

## 2014-05-17 MED ORDER — SITAGLIP PHOS-METFORMIN HCL ER 50-1000 MG PO TB24: 1.0000 | ORAL_TABLET | Freq: Two times a day (BID) | ORAL | Status: DC

## 2014-05-17 NOTE — Telephone Encounter (Signed)
Brittany [name], Will you please let patient know that her A1c remains elevated at 12 and liver enzymes are elevated but unchanged from four months ago.  I'd recommend continuing on Janumet XR which [facility] to help with both of these findings.  Cholesterol has improved therefore continue simvastatin, if her blood sugar improves and her A1c gets below 7 theres a possibility simvastatin can be stopped.  F/U in September.

## 2014-05-17 NOTE — Telephone Encounter (Signed)
Message left on vm 

## 2014-05-29 ENCOUNTER — Ambulatory Visit: Payer: 59 | Admitting: Family Medicine

## 2014-06-11 ENCOUNTER — Ambulatory Visit: Payer: 59 | Admitting: Family Medicine

## 2014-06-11 DIAGNOSIS — Z0289 Encounter for other administrative examinations: Secondary | ICD-10-CM

## 2014-07-09 ENCOUNTER — Ambulatory Visit: Payer: Self-pay | Admitting: Family Medicine

## 2014-07-26 ENCOUNTER — Ambulatory Visit: Payer: Self-pay | Admitting: Family Medicine

## 2014-07-31 ENCOUNTER — Ambulatory Visit: Payer: Self-pay | Admitting: Family Medicine

## 2014-08-08 ENCOUNTER — Encounter: Payer: Self-pay | Admitting: Family Medicine

## 2014-08-08 ENCOUNTER — Ambulatory Visit (INDEPENDENT_AMBULATORY_CARE_PROVIDER_SITE_OTHER): Payer: 59 | Admitting: Family Medicine

## 2014-08-08 VITALS — BP 125/85 | HR 88 | Wt 213.0 lb

## 2014-08-08 DIAGNOSIS — E119 Type 2 diabetes mellitus without complications: Secondary | ICD-10-CM

## 2014-08-08 DIAGNOSIS — I10 Essential (primary) hypertension: Secondary | ICD-10-CM

## 2014-08-08 LAB — POCT GLYCOSYLATED HEMOGLOBIN (HGB A1C): Hemoglobin A1C: 12.2

## 2014-08-08 MED ORDER — EMPAGLIFLOZIN-LINAGLIPTIN 25-5 MG PO TABS: 1.0000 | ORAL_TABLET | Freq: Every day | ORAL | Status: DC

## 2014-08-08 NOTE — Progress Notes (Signed)
CC: Brittany BanningLakisha [name] is a 38 y.o. female is here for Diabetes and Hypertension   Subjective: HPI:  Her wedding is set for next October  Follow-up type 2 diabetes: For the first week taking Janumet XR she denies any side effects. After one week she began to have diarrhea on a daily basis. She stopped the medication and diarrhea resolved. About a week later she restarted the medication and diarrhea came on immediately. She no longer has diarrhea since she stopped this medication well over 2 months ago. Blood sugars at home have ranged from slightly below 100 -190. She has not seen any blood sugars above 200. She reports recurrent vaginal yeast infections that do respond temporarily to over-the-counter antifungals. There has been no polyuria polyphagia polydipsia nor vision loss. She is exercising by walking on a daily basis.  Follow-up essential hypertension: Continues on the lisinopril-hydrochlorothiazide. She checks her blood pressure frequently throughout the week all of which have been in the normotensive range. She denies chest pain shortness of breath orthopnea nor peripheral edema. Denies motor or sensory disturbances   Review Of Systems Outlined In HPI  Past Medical History  Diagnosis Date  . Depression   . Hypertension UNSURE  . HLD (hyperlipidemia) 01/02/2014  . Diabetes mellitus without complication     Past Surgical History  Procedure Laterality Date  . Tubal ligation     Family History  Problem Relation Age of Onset  . Diabetes Mother   . Hyperlipidemia Mother   . Diabetes Father   . Asthma Son   . Diabetes Paternal Grandmother   . Diabetes Paternal Grandfather   . Kidney disease Paternal Grandfather     History   Social History  . Marital Status: Single    Spouse Name: N/A    Number of Children: N/A  . Years of Education: N/A   Occupational History  . Not on file.   Social History Main Topics  . Smoking status: Never Smoker   . Smokeless tobacco: Never  Used  . Alcohol Use: No  . Drug Use: No  . Sexual Activity: Yes    Birth Control/ Protection: None, Surgical   Other Topics Concern  . Not on file   Social History Narrative     Objective: BP 125/85 mmHg  Pulse 88  Wt 213 lb (96.616 kg)  General: Alert and Oriented, No Acute Distress HEENT: Pupils equal, round, reactive to light. Conjunctivae clear.  Moist mucous membranes pharynx unremarkable Lungs:clearing comfortable work of breathing Cardiac: Regular rate and rhythm.  Extremities: No peripheral edema.  Strong peripheral pulses.  Mental Status: No depression, anxiety, nor agitation. Skin: Warm and dry.  Assessment & Plan: Brittany [name] was seen today for diabetes and hypertension.  Diagnoses and associated orders for this visit:  Type 2 diabetes mellitus without complication - POCT HgB A1C - Empagliflozin-Linagliptin (GLYXAMBI) 25-5 MG TABS; Take 1 tablet by mouth daily. Will use savings voucher.  Essential hypertension     type 2 diabetes: A1c a little over 12 today, uncontrolled starting Glyxambi and provided with a savings voucher. I prepared her that she may feel some nauseousness the first week that she begin this medication and that I predict it will resolve within 1 week. Essential hypertension: Controlled continue lisinopril-hydrochlorothiazide  Return in about 3 months (around 11/08/2014) for Diabetes.

## 2014-09-24 ENCOUNTER — Telehealth: Payer: Self-pay | Admitting: Family Medicine

## 2014-09-24 MED ORDER — FLUCONAZOLE 150 MG PO TABS: ORAL_TABLET | ORAL | Status: AC

## 2014-09-24 NOTE — Telephone Encounter (Signed)
Refill req 

## 2014-10-17 ENCOUNTER — Other Ambulatory Visit: Payer: Self-pay | Admitting: *Deleted

## 2014-10-17 DIAGNOSIS — E119 Type 2 diabetes mellitus without complications: Secondary | ICD-10-CM

## 2014-10-17 MED ORDER — EMPAGLIFLOZIN-LINAGLIPTIN 25-5 MG PO TABS: 1.0000 | ORAL_TABLET | Freq: Every day | ORAL | Status: DC

## 2014-11-09 ENCOUNTER — Encounter: Payer: Self-pay | Admitting: Family Medicine

## 2014-11-09 ENCOUNTER — Ambulatory Visit (INDEPENDENT_AMBULATORY_CARE_PROVIDER_SITE_OTHER): Payer: 59 | Admitting: Family Medicine

## 2014-11-09 VITALS — BP 121/74 | HR 69 | Ht 63.0 in | Wt 216.0 lb

## 2014-11-09 DIAGNOSIS — I889 Nonspecific lymphadenitis, unspecified: Secondary | ICD-10-CM

## 2014-11-09 DIAGNOSIS — I10 Essential (primary) hypertension: Secondary | ICD-10-CM

## 2014-11-09 DIAGNOSIS — E119 Type 2 diabetes mellitus without complications: Secondary | ICD-10-CM

## 2014-11-09 LAB — POCT GLYCOSYLATED HEMOGLOBIN (HGB A1C): Hemoglobin A1C: 10.9

## 2014-11-09 MED ORDER — CEPHALEXIN 500 MG PO CAPS: 500.0000 mg | ORAL_CAPSULE | Freq: Three times a day (TID) | ORAL | Status: DC

## 2014-11-09 MED ORDER — GLIPIZIDE 5 MG PO TABS: 5.0000 mg | ORAL_TABLET | Freq: Two times a day (BID) | ORAL | Status: DC

## 2014-11-09 MED ORDER — METOPROLOL SUCCINATE ER 50 MG PO TB24: 50.0000 mg | ORAL_TABLET | Freq: Every day | ORAL | Status: DC

## 2014-11-09 NOTE — Progress Notes (Signed)
CC: Brittany [name] is a 39 y.o. female is here for Follow-up   Subjective: HPI:  Follow essential hypertension: Continues on lisinopril-hydrochlorothiazide. No outside blood pressures to report. She tells me that she is actively trying to get pregnant. No chest pain shortness of breath orthopnea nor peripheral edema  Follow-up type 2 diabetes: Taking Glyxambi daily with occasional blood sugars that have been checked yesterday was as low as 90 but she has seen high as high as low 200s. No polyuria or polyphagia or polydipsia. No poorly healing wounds. Trying to watch what she eats but admits room for improvement no motor or sensory disturbances.  She complains of pain in the left neck just behind the ear. There is some mild swelling that she noticed on Wednesday. It's worse with looking only to the right. Also worse with touching this swelling. She feels overall in her regular state of health other than this discomfort. She denies ear pain, hearing loss, scalp concerns, sore throat or dental pain. No fevers or chills   Review Of Systems Outlined In HPI  Past Medical History  Diagnosis Date  . Depression   . Hypertension UNSURE  . HLD (hyperlipidemia) 01/02/2014  . Diabetes mellitus without complication     Past Surgical History  Procedure Laterality Date  . Tubal ligation     Family History  Problem Relation Age of Onset  . Diabetes Mother   . Hyperlipidemia Mother   . Diabetes Father   . Asthma Son   . Diabetes Paternal Grandmother   . Diabetes Paternal Grandfather   . Kidney disease Paternal Grandfather     History   Social History  . Marital Status: Single    Spouse Name: N/A  . Number of Children: N/A  . Years of Education: N/A   Occupational History  . Not on file.   Social History Main Topics  . Smoking status: Never Smoker   . Smokeless tobacco: Never Used  . Alcohol Use: No  . Drug Use: No  . Sexual Activity: Yes    Birth Control/ Protection: None,  Surgical   Other Topics Concern  . Not on file   Social History Narrative     Objective: BP 121/74 mmHg  Pulse 69  Ht  (1.6 m)  Wt 216 lb (97.977 kg)  BMI 38.27 kg/m2  General: Alert and Oriented, No Acute Distress HEENT: Pupils equal, round, reactive to light. Conjunctivae clear.  External ears unremarkable, canals clear with intact TMs with appropriate landmarks.  Middle ear appears open without effusion. Pink inferior turbinates.  Moist mucous membranes, pharynx without inflammation nor lesions. Quarter centimeter spongy mobile mass behind the left ear tender to palpation. No other palpable abnormalities in the neck. No overlying skin changes at this discomfort Lungs: Clear to auscultation bilaterally, no wheezing/ronchi/rales.  Comfortable work of breathing. Good air movement. Cardiac: Regular rate and rhythm. Normal S1/S2.  No murmurs, rubs, nor gallops.   Extremities: No peripheral edema.  Strong peripheral pulses.  Mental Status: No depression, anxiety, nor agitation. Skin: Warm and dry.  Assessment & Plan: Brittany [name] was seen today for follow-up.  Diagnoses and all orders for this visit:  Essential hypertension Orders: -     metoprolol succinate (TOPROL-XL) 50 MG 24 hr tablet; Take 1 tablet (50 mg total) by mouth daily. Take with or immediately following a meal.  Type 2 diabetes mellitus without complication Orders: -     POCT HgB A1C  Lymphadenitis Orders: -     cephALEXin (  KEFLEX) 500 MG capsule; Take 1 capsule (500 mg total) by mouth 3 (three) times daily.  Other orders -     glipiZIDE (GLUCOTROL) 5 MG tablet; Take 1 tablet (5 mg total) by mouth 2 (two) times daily before a meal.   Type 2 diabetes: A1c of 10.6 today, continue glyxambi adding glipizide. I've asked her to let me know as soon as possible if she becomes pregnant and will need to stop these medications and switch her to insulin. Lymphadenitis: Start Keflex. Signs and symptoms requring  emergent/urgent reevaluation were discussed with the patient. Essential hypertension: Controlled I've encouraged her to stop lisinopril immediately and switch this over to metoprolol, discussed the risk of renal complications with her fetus if she is taking lisinopril and pregnant. Stop simvastatin immediately since she is trying to get pregnant. Discussed that we can restart this if she either abandoned her pregnancy efforts or if she gets pregnant and then delivers.   Return in about 3 months (around 02/07/2015).

## 2014-11-09 NOTE — Patient Instructions (Signed)
Stop simvastain and linsopirl-hydrochlorothiazide, this will be replaced with metoprolol.  Stop the glyxambi if you find out you become pregnant.

## 2014-12-06 ENCOUNTER — Encounter: Payer: Self-pay | Admitting: Family Medicine

## 2014-12-06 ENCOUNTER — Ambulatory Visit (INDEPENDENT_AMBULATORY_CARE_PROVIDER_SITE_OTHER): Payer: 59 | Admitting: Family Medicine

## 2014-12-06 VITALS — BP 150/92 | HR 64 | Wt 219.0 lb

## 2014-12-06 DIAGNOSIS — F329 Major depressive disorder, single episode, unspecified: Secondary | ICD-10-CM

## 2014-12-06 DIAGNOSIS — F32A Depression, unspecified: Secondary | ICD-10-CM

## 2014-12-06 MED ORDER — SERTRALINE HCL 100 MG PO TABS: 100.0000 mg | ORAL_TABLET | Freq: Every day | ORAL | Status: DC

## 2014-12-06 NOTE — Progress Notes (Signed)
CC: Brittany BanningLakisha [name] is a 39 y.o. female is here for discuss mental health   Subjective: HPI:  Patient reports that since September 2015 she has had feelings of lack of interest in life, loss of interest in hobbies, has become more socially withdrawn, has felt more stressed out due to pressures of her fianc wanting to start a family and job responsibility is at work. Symptoms seem to be worse the more responsibility she is faced with at work. She was recently told that her productivity seems to be dwindling and that her physician may be in jeopardy due to this. She feels like this is what pushed her over the threshold for her to seek out care. She denies thoughts 1 harm herself or others but she does report a suicide attempt in 2009 due to similar symptoms above but which were much more severe. Currently her symptoms aren't present on daily basis moderate in severity and nothing particular seems to help.  She tells me she was hospitalized for a few days somewhere around [place] 2009 and started on Abilify and some other medication. The medications made her feel numb and disoriented so she stopped them by herself and never pursued follow-up for psychiatric care.  She tells me she was diagnosed with bipolar disorder however there has been no recent remote history of mania or any other mental disturbance beyond depressive symptoms.   Review Of Systems Outlined In HPI  Past Medical History  Diagnosis Date  . Depression   . Hypertension UNSURE  . HLD (hyperlipidemia) 01/02/2014  . Diabetes mellitus without complication     Past Surgical History  Procedure Laterality Date  . Tubal ligation     Family History  Problem Relation Age of Onset  . Diabetes Mother   . Hyperlipidemia Mother   . Diabetes Father   . Asthma Son   . Diabetes Paternal Grandmother   . Diabetes Paternal Grandfather   . Kidney disease Paternal Grandfather     History   Social History  . Marital Status: Single   Spouse Name: N/A  . Number of Children: N/A  . Years of Education: N/A   Occupational History  . Not on file.   Social History Main Topics  . Smoking status: Never Smoker   . Smokeless tobacco: Never Used  . Alcohol Use: No  . Drug Use: No  . Sexual Activity: Yes    Birth Control/ Protection: None, Surgical   Other Topics Concern  . Not on file   Social History Narrative     Objective: BP 150/92 mmHg  Pulse 64  Wt 219 lb (99.338 kg)  Vital signs reviewed. General: Alert and Oriented, No Acute Distress HEENT: Pupils equal, round, reactive to light. Conjunctivae clear.  External ears unremarkable.  Moist mucous membranes. Lungs: Clear and comfortable work of breathing, speaking in full sentences without accessory muscle use. Cardiac: Regular rate and rhythm.  Neuro: CN II-XII grossly intact, gait normal. Extremities: No peripheral edema.  Strong peripheral pulses.  Mental Status: Moderately depressed, no anxiety or agitation. Logical thought process. Tearful during the entire exam Skin: Warm and dry. Assessment & Plan: Brittany BealLakisha was seen today for discuss mental health.  Diagnoses and all orders for this visit:  Depression Orders: -     sertraline (ZOLOFT) 100 MG tablet; Take 1 tablet (100 mg total) by mouth daily.   Depression: Discussed that all psychiatric medications carry some risk of pregnancy/fetal complications. She still trying to actively get pregnant I discussed with  her that this is a personal decision but it doesn't sound like the optimal time to get pregnant with her worsening psychiatric condition. If she should get pregnant I asked her to let me know soon as possible, my literature review would suggest that Zoloft is the least likely to cause harm and should still provide some benefit for depression. She is going to look into counseling is offered through her job. She will take the next 2 weeks off to pursue counseling and also get adjusted to Zoloft before  going back to the stress of work.  40 minutes spent face-to-face during visit today of which at least 50% was counseling or coordinating care regarding: 1. Depression      Return in about 4 weeks (around 01/03/2015) for Mood.

## 2014-12-21 ENCOUNTER — Encounter: Payer: Self-pay | Admitting: Family Medicine

## 2014-12-21 ENCOUNTER — Ambulatory Visit (INDEPENDENT_AMBULATORY_CARE_PROVIDER_SITE_OTHER): Payer: 59 | Admitting: Family Medicine

## 2014-12-21 VITALS — BP 140/89 | HR 78 | Wt 221.0 lb

## 2014-12-21 DIAGNOSIS — F329 Major depressive disorder, single episode, unspecified: Secondary | ICD-10-CM | POA: Diagnosis not present

## 2014-12-21 DIAGNOSIS — F32A Depression, unspecified: Secondary | ICD-10-CM

## 2014-12-21 MED ORDER — VILAZODONE HCL 10 & 20 MG PO KIT: 1.0000 | PACK | Freq: Every day | ORAL | Status: DC

## 2014-12-21 NOTE — Progress Notes (Signed)
CC: Brittany [name] is a 39 y.o. female is here for Medication Management   Subjective: HPI:  2 days after she started taking Zoloft she began to have a intolerable headache and palpitations. She stopped taking his medications and symptoms resolved after a day or 2. She restarted Zoloft and had a return of her symptoms within 1 day. She then stopped the medication and symptoms have subsided and are absent ever since. She feels like her depression is getting worse. She believes that her children are now distancing themselves from her wanting to spend more time with her biological father because of her depression which makes her even have a poorer outlook on life. Nothing else seems to makes this better or worse. She reports a sensation of not wanting to wake up because of how depressed she feels in the morning, lack of motivation to get out of the bed, decreased appetite and increased need for sleep. Symptoms overall are severe in severity. She denies any thoughts of wanting to harm herself or others. She seen a counselor at least once a week however she feels like she is allowed to vent during these visits but she gets no feedback on how to manage her depression and feelings. She does not feel stable enough to go back to work. Denies any other mental disturbance such as paranoia and anxiety or paranoia.   Review Of Systems Outlined In HPI  Past Medical History  Diagnosis Date  . Depression   . Hypertension UNSURE  . HLD (hyperlipidemia) 01/02/2014  . Diabetes mellitus without complication     Past Surgical History  Procedure Laterality Date  . Tubal ligation     Family History  Problem Relation Age of Onset  . Diabetes Mother   . Hyperlipidemia Mother   . Diabetes Father   . Asthma Son   . Diabetes Paternal Grandmother   . Diabetes Paternal Grandfather   . Kidney disease Paternal Grandfather     History   Social History  . Marital Status: Single    Spouse Name: N/A  . Number of  Children: N/A  . Years of Education: N/A   Occupational History  . Not on file.   Social History Main Topics  . Smoking status: Never Smoker   . Smokeless tobacco: Never Used  . Alcohol Use: No  . Drug Use: No  . Sexual Activity: Yes    Birth Control/ Protection: None, Surgical   Other Topics Concern  . Not on file   Social History Narrative     Objective: BP 140/89 mmHg  Pulse 78  Wt 221 lb (100.245 kg)  SpO2 95%  Vital signs reviewed. General: Alert and Oriented, No Acute Distress HEENT: Pupils equal, round, reactive to light. Conjunctivae clear.  External ears unremarkable.  Moist mucous membranes. Lungs: Clear and comfortable work of breathing, speaking in full sentences without accessory muscle use. Cardiac: Regular rate and rhythm.  Neuro: CN II-XII grossly intact, gait normal. Extremities: No peripheral edema.  Strong peripheral pulses.  Mental Status: moderate to severe depression with no anxiety or agitation. Logical thought process. Tearful during the entire exam Skin: Warm and dry.  Assessment & Plan: Brittany [name] was seen today for medication management.  Diagnoses and all orders for this visit:  Depression Orders: -     Ambulatory referral to Psychiatry  Other orders -     Vilazodone HCl (VIIBRYD STARTER PACK) 10 & 20 MG KIT; Take 1 tablet by mouth daily.   Depression: Uncontrolled chronic  condition, discussed referral to psychiatry given severity of symptoms in the meantime start viibryd. If she has a drastic improvement with this medication she can cancel her psychiatry referral appointment otherwise follow-up with psychiatry and me within 2 weeks.  40 minutes spent face-to-face during visit today of which at least 50% was counseling or coordinating care regarding: 1. Depression      Return in about 2 weeks (around 01/04/2015) for mood.

## 2014-12-27 ENCOUNTER — Encounter: Payer: Self-pay | Admitting: Family Medicine

## 2014-12-27 DIAGNOSIS — F329 Major depressive disorder, single episode, unspecified: Secondary | ICD-10-CM | POA: Insufficient documentation

## 2014-12-27 DIAGNOSIS — F32A Depression, unspecified: Secondary | ICD-10-CM | POA: Insufficient documentation

## 2015-01-03 ENCOUNTER — Ambulatory Visit (HOSPITAL_COMMUNITY): Payer: 59 | Admitting: Psychiatry

## 2015-01-03 ENCOUNTER — Ambulatory Visit (INDEPENDENT_AMBULATORY_CARE_PROVIDER_SITE_OTHER): Payer: 59 | Admitting: Family Medicine

## 2015-01-03 ENCOUNTER — Encounter: Payer: Self-pay | Admitting: Family Medicine

## 2015-01-03 VITALS — BP 144/93 | HR 78 | Wt 219.0 lb

## 2015-01-03 DIAGNOSIS — F329 Major depressive disorder, single episode, unspecified: Secondary | ICD-10-CM

## 2015-01-03 DIAGNOSIS — F32A Depression, unspecified: Secondary | ICD-10-CM

## 2015-01-03 MED ORDER — VILAZODONE HCL 40 MG PO TABS: 40.0000 mg | ORAL_TABLET | Freq: Every day | ORAL | Status: DC

## 2015-01-03 MED ORDER — TRAZODONE HCL 100 MG PO TABS: 50.0000 mg | ORAL_TABLET | Freq: Every evening | ORAL | Status: DC | PRN

## 2015-01-03 NOTE — Progress Notes (Signed)
CC: Brittany BanningLakisha [name] is a 39 y.o. female is here for f/u mood   Subjective: HPI:   Follow-up depression:  Continues on viibryd  20 mg daily with no known side effects. No known side effects at 10 mg formulation.  Last week she was able to  Begin looking forward to  Something for the first time in the last few months. This was her son's birthday, unfortunately he did not want to spend it with her and she tells me that this devastated her and she feels like she is back to her  Normal depressed self. She has not left the house since I saw her last except for coming to this appointment. She tells me that she has difficulty falling and staying asleep despite feeling fatigued all day long.  She tells me she has no motivation to get out of the house  She spends most of her time in bed  Despite not being able to sleep. She tells me she doesn't have anything look forward to and all she thinks about is overall negativity.  Symptoms are worse when her fianc or children do not seem to understand why she is depressed and not getting better. Symptoms are severe in severity with no thoughts of wanting to harm herself or others. No  Anxiety nor auditory or visual hallucinations.   Review Of Systems Outlined In HPI  Past Medical History  Diagnosis Date  . Depression   . Hypertension UNSURE  . HLD (hyperlipidemia) 01/02/2014  . Diabetes mellitus without complication     Past Surgical History  Procedure Laterality Date  . Tubal ligation     Family History  Problem Relation Age of Onset  . Diabetes Mother   . Hyperlipidemia Mother   . Diabetes Father   . Asthma Son   . Diabetes Paternal Grandmother   . Diabetes Paternal Grandfather   . Kidney disease Paternal Grandfather     History   Social History  . Marital Status: Single    Spouse Name: N/A  . Number of Children: N/A  . Years of Education: N/A   Occupational History  . Not on file.   Social History Main Topics  . Smoking status: Never  Smoker   . Smokeless tobacco: Never Used  . Alcohol Use: No  . Drug Use: No  . Sexual Activity: Yes    Birth Control/ Protection: None, Surgical   Other Topics Concern  . Not on file   Social History Narrative     Objective: BP 144/93 mmHg  Pulse 78  Wt 219 lb (99.338 kg)  Vital signs reviewed. General: Alert and Oriented, No Acute Distress HEENT: Pupils equal, round, reactive to light. Conjunctivae clear.  External ears unremarkable.  Moist mucous membranes. Lungs: Clear and comfortable work of breathing, speaking in full sentences without accessory muscle use. Cardiac: Regular rate and rhythm.  Neuro: CN II-XII grossly intact, gait normal. Extremities: No peripheral edema.  Strong peripheral pulses.  Mental Status: moderate to severe depression without anxiety or agitation. Logical though process.crying throughout the encounter Skin: Warm and dry.   Assessment & Plan: Brittany [name] was seen today for f/u mood.  Diagnoses and all orders for this visit:  Depression  Other orders -     Vilazodone HCl (VIIBRYD) 40 MG TABS; Take 1 tablet (40 mg total) by mouth daily. -     traZODone (DESYREL) 100 MG tablet; Take 0.5-1 tablets (50-100 mg total) by mouth at bedtime as needed for sleep.   Depression:  Uncontrolled chronic condition increasing viibryd to maximum dose. She is requesting something to help with sleep, hopefully trazodone will provide both sleep and some depression benefits.discussed with the severity of her case warrants psychiatric care with a specialist she was not contacted by referral yet site asked her to go downstairs today to physically establish with our behavioral health colleagues but that I will do as much as possible between now and when she can have her first appointment to help with her depression.  40 minutes spent face-to-face during visit today of which at least 50% was counseling or coordinating care regarding: 1. Depression      Return in about 4  weeks (around 01/31/2015).

## 2015-01-04 ENCOUNTER — Encounter (HOSPITAL_COMMUNITY): Payer: Self-pay | Admitting: Licensed Clinical Social Worker

## 2015-01-04 ENCOUNTER — Ambulatory Visit (INDEPENDENT_AMBULATORY_CARE_PROVIDER_SITE_OTHER): Payer: PRIVATE HEALTH INSURANCE | Admitting: Licensed Clinical Social Worker

## 2015-01-04 DIAGNOSIS — F4310 Post-traumatic stress disorder, unspecified: Secondary | ICD-10-CM

## 2015-01-04 DIAGNOSIS — F3189 Other bipolar disorder: Secondary | ICD-10-CM | POA: Diagnosis not present

## 2015-01-04 DIAGNOSIS — F314 Bipolar disorder, current episode depressed, severe, without psychotic features: Secondary | ICD-10-CM
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
## 2015-01-08 ENCOUNTER — Encounter (HOSPITAL_COMMUNITY): Payer: Self-pay | Admitting: Psychiatry

## 2015-01-08 ENCOUNTER — Ambulatory Visit (INDEPENDENT_AMBULATORY_CARE_PROVIDER_SITE_OTHER): Payer: PRIVATE HEALTH INSURANCE | Admitting: Psychiatry

## 2015-01-08 ENCOUNTER — Telehealth (HOSPITAL_COMMUNITY): Payer: Self-pay | Admitting: *Deleted

## 2015-01-08 VITALS — BP 130/88 | HR 80 | Ht 63.0 in | Wt 219.0 lb

## 2015-01-08 DIAGNOSIS — F4310 Post-traumatic stress disorder, unspecified: Secondary | ICD-10-CM

## 2015-01-08 DIAGNOSIS — F332 Major depressive disorder, recurrent severe without psychotic features: Secondary | ICD-10-CM | POA: Diagnosis not present

## 2015-01-08 MED ORDER — LAMOTRIGINE 25 MG PO TABS: 25.0000 mg | ORAL_TABLET | Freq: Every day | ORAL | Status: DC

## 2015-01-08 MED ORDER — LAMOTRIGINE 25 MG PO TABS: ORAL_TABLET | ORAL | Status: DC

## 2015-01-08 NOTE — Telephone Encounter (Signed)
Generic lamictal written instead of brand name.
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
## 2015-01-08 NOTE — Telephone Encounter (Signed)
Pt states she can not afford Lamictal. Please call to advise for a new medication.

## 2015-01-10 ENCOUNTER — Ambulatory Visit (INDEPENDENT_AMBULATORY_CARE_PROVIDER_SITE_OTHER): Payer: PRIVATE HEALTH INSURANCE | Admitting: Licensed Clinical Social Worker

## 2015-01-10 ENCOUNTER — Telehealth: Payer: Self-pay | Admitting: Family Medicine

## 2015-01-10 DIAGNOSIS — F319 Bipolar disorder, unspecified: Secondary | ICD-10-CM | POA: Diagnosis not present

## 2015-01-10 DIAGNOSIS — F314 Bipolar disorder, current episode depressed, severe, without psychotic features: Secondary | ICD-10-CM

## 2015-01-10 DIAGNOSIS — F4310 Post-traumatic stress disorder, unspecified: Secondary | ICD-10-CM

## 2015-01-10 NOTE — Psych (Signed)
   THERAPIST PROGRESS NOTE  Session Time: 9:00am-10:00am  Participation Level: Active  Behavioral Response: Fairly GroomedDrowsyDepressed  Type of Therapy: Individual Therapy  Treatment Goals addressed: Coping  Interventions: Supportive, Treatment Planning  Suicidal/Homicidal: vague SI without intent or plan, denies HI  Therapist Interventions:  Gathered information about significant events and changes in mood and functioning since last seen.  Discussed how she might communicate what is going on with her and her needs at this time with her kids.  Informed patient about a Mental Health Intensive Outpatient Program (MHIOP) offered through [facility].  Recommended she participate.  Suggested it could be a good way to gain extra support.  Called to make a referral.      Summary: Learned that fiance has been untruthful about some things.  Indicated feeling as though he is not being particularly supportive.  Noted that children have been distancing themselves from her.  The lack of support has contributed to a worsening of symptoms.  Hasn't been eating or sleeping much.   Indicated she is open to the ideas for communicating with her children. Expressed interest in the MHIOP.  Learned that she can start next Tuesday.    Plan: Will resume therapy after MHIOP.  Diagnosis: Bipolar Disorder                         PTSD    Darrin LuisSolomon, Jaimie Redditt A, LCSW 01/10/2015

## 2015-01-10 NOTE — Telephone Encounter (Signed)
Sue LushAndrea, The behavioral health office is setting this patinet up with IOP.  Her leave of absence form has been addended and can you please re-fax it.

## 2015-01-10 NOTE — Telephone Encounter (Signed)
Form refaxed and will hold at my desk for a  Few days

## 2015-01-23 ENCOUNTER — Encounter (HOSPITAL_COMMUNITY): Payer: Self-pay | Admitting: Psychiatry

## 2015-01-23 ENCOUNTER — Other Ambulatory Visit (HOSPITAL_COMMUNITY): Payer: PRIVATE HEALTH INSURANCE | Attending: Psychiatry | Admitting: Psychiatry

## 2015-01-23 ENCOUNTER — Telehealth: Payer: Self-pay | Admitting: Family Medicine

## 2015-01-23 DIAGNOSIS — F332 Major depressive disorder, recurrent severe without psychotic features: Secondary | ICD-10-CM | POA: Diagnosis not present

## 2015-01-23 DIAGNOSIS — F419 Anxiety disorder, unspecified: Secondary | ICD-10-CM | POA: Diagnosis not present

## 2015-01-23 DIAGNOSIS — F4310 Post-traumatic stress disorder, unspecified: Secondary | ICD-10-CM

## 2015-01-23 DIAGNOSIS — I10 Essential (primary) hypertension: Secondary | ICD-10-CM | POA: Insufficient documentation

## 2015-01-23 DIAGNOSIS — Z62810 Personal history of physical and sexual abuse in childhood: Secondary | ICD-10-CM | POA: Insufficient documentation

## 2015-01-23 DIAGNOSIS — E119 Type 2 diabetes mellitus without complications: Secondary | ICD-10-CM | POA: Insufficient documentation

## 2015-01-23 DIAGNOSIS — F314 Bipolar disorder, current episode depressed, severe, without psychotic features: Secondary | ICD-10-CM

## 2015-01-23 NOTE — Addendum Note (Signed)
Addended by: Sheralyn BoatmanLARK, MARGUERITE P on: 01/23/2015 02:01 PM   Modules accepted: Medications
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
## 2015-01-23 NOTE — Telephone Encounter (Signed)
Pt was not seen in our office on 01/09/15. Faxed letter that Hommel provided but I did call and let them know that she was not seen in the office then and records request needs to be sent to their office. The rep agreed and agreed that records need to be sent there if they are managing her medications for her condition that is keeping her out of work

## 2015-01-23 NOTE — Progress Notes (Signed)
    Daily Group Progress Note  Program: IOP  Group Time: 9:00-10:30  Participation Level: Active  Behavioral Response: Appropriate  Type of Therapy:  Group Therapy  Summary of Progress: Pt. Met with case manager and psychiatrist.      Group Time: 10:30-12:00  Participation Level:  Active  Behavioral Response: Appropriate  Type of Therapy: Psycho-education Group  Summary of Progress: Pt. Participated in discussion about addictive behaviors and replacement with healthy coping behaviors.   Nancie Neas, LPC

## 2015-01-23 NOTE — Telephone Encounter (Signed)
Sue Lushndrea, Can you please send office notes from 01/09/15 until present along with a letter attached to the update request in your inbox?

## 2015-01-24 ENCOUNTER — Other Ambulatory Visit (HOSPITAL_COMMUNITY): Payer: PRIVATE HEALTH INSURANCE | Admitting: Psychiatry

## 2015-01-24 ENCOUNTER — Ambulatory Visit (HOSPITAL_COMMUNITY): Payer: Self-pay | Admitting: Psychiatry

## 2015-01-24 ENCOUNTER — Ambulatory Visit (HOSPITAL_COMMUNITY): Payer: 59 | Admitting: Licensed Clinical Social Worker

## 2015-01-24 DIAGNOSIS — F314 Bipolar disorder, current episode depressed, severe, without psychotic features: Secondary | ICD-10-CM

## 2015-01-24 DIAGNOSIS — F4310 Post-traumatic stress disorder, unspecified: Secondary | ICD-10-CM

## 2015-01-24 DIAGNOSIS — F332 Major depressive disorder, recurrent severe without psychotic features: Secondary | ICD-10-CM | POA: Diagnosis not present

## 2015-01-25 ENCOUNTER — Telehealth (HOSPITAL_COMMUNITY): Payer: Self-pay | Admitting: Psychiatry

## 2015-01-25 ENCOUNTER — Other Ambulatory Visit (HOSPITAL_COMMUNITY): Payer: PRIVATE HEALTH INSURANCE

## 2015-01-25 NOTE — Progress Notes (Signed)
    Daily Group Progress Note  Program: IOP  Group Time: 9:00-10:30  Participation Level: Active  Behavioral Response: Appropriate  Type of Therapy:  Group Therapy  Summary of Progress: Pt. Presented as talkative, makes appropriate eye contact. Pt. 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 Pt. Considering other employment options but feels stuck due to the pay and benefits and fear that she will not be able to afford to leave or find a comparable job in pay.     Group Time: 10:30-12:00  Participation Level:  Active  Behavioral Response: Appropriate  Type of Therapy: Psycho-education Group  Summary of Progress: Pt. Participated in discussion about use of meditation as emotion regulation and emotional processing tool.   Shaune PollackBrown, Xzavian Semmel B, LPC

## 2015-01-28 ENCOUNTER — Other Ambulatory Visit (HOSPITAL_COMMUNITY): Payer: PRIVATE HEALTH INSURANCE | Admitting: Psychiatry

## 2015-01-28 ENCOUNTER — Ambulatory Visit (HOSPITAL_COMMUNITY): Payer: Self-pay | Admitting: Psychiatry

## 2015-01-28 DIAGNOSIS — F4310 Post-traumatic stress disorder, unspecified: Secondary | ICD-10-CM

## 2015-01-28 DIAGNOSIS — F332 Major depressive disorder, recurrent severe without psychotic features: Secondary | ICD-10-CM | POA: Diagnosis not present

## 2015-01-28 DIAGNOSIS — F314 Bipolar disorder, current episode depressed, severe, without psychotic features: Secondary | ICD-10-CM

## 2015-01-28 NOTE — Progress Notes (Signed)
    Daily Group Progress Note  Program: IOP  Group Time: 9:00-10:30  Participation Level: Active  Behavioral Response: Appropriate  Type of Therapy:  Group Therapy  Summary of Progress: Pt. Presented as quiet, appeared pre-occupied by her cell phone. Pt. Discussed history of depression diagnosis and challenge of accepting her diagnosis. Pt. Discussed that relationships with her 4214 and 39 year old sons is challenging because of depression her sons are spending more time with their father and she misses them.     Group Time: 10:30-12:00  Participation Level:  Active  Behavioral Response: Appropriate  Type of Therapy: Psycho-education Group  Summary of Progress:  Pt. Participated in discussion about countering perfectionism with self-compassion (i.e., mindfulness, acknowledging common humanity, and words of kindness directed towards self).  Shaune PollackBrown, Jennifer B, LPC

## 2015-01-29 ENCOUNTER — Other Ambulatory Visit (HOSPITAL_COMMUNITY): Payer: PRIVATE HEALTH INSURANCE

## 2015-01-30 ENCOUNTER — Other Ambulatory Visit (HOSPITAL_COMMUNITY): Payer: PRIVATE HEALTH INSURANCE | Admitting: Psychiatry

## 2015-01-30 DIAGNOSIS — F314 Bipolar disorder, current episode depressed, severe, without psychotic features: Secondary | ICD-10-CM

## 2015-01-30 DIAGNOSIS — F4310 Post-traumatic stress disorder, unspecified: Secondary | ICD-10-CM

## 2015-01-30 DIAGNOSIS — F332 Major depressive disorder, recurrent severe without psychotic features: Secondary | ICD-10-CM | POA: Diagnosis not present

## 2015-01-30 NOTE — Progress Notes (Signed)
    Daily Group Progress Note  Program: IOP  Group Time: 9:00-10:30  Participation Level: Active  Behavioral Response: Appropriate  Type of Therapy:  Group Therapy  Summary of Progress: Pt. Presented as quiet, but alert and attentive. Pt. Discussed history of dysfunctional family, feeling abandoned by biological mother and taking on the role of parent as a young age. Pt. Reported continuing to cope with anger and resentment regarding lost childhood.      Group Time: 10:30-12:00  Participation Level:  Active  Behavioral Response: Appropriate  Type of Therapy: Psycho-education Group  Summary of Progress: Pt. Watched Principal FinancialBrene Marvella Jenning videos on blame, boundaries, and vulnerability. Pt. Participated in discussion about developing healthy relationship boundaries.   Shaune PollackBrown, Cassidie Veiga B, LPC

## 2015-01-31 ENCOUNTER — Ambulatory Visit (HOSPITAL_COMMUNITY): Payer: 59 | Admitting: Licensed Clinical Social Worker

## 2015-01-31 ENCOUNTER — Other Ambulatory Visit (HOSPITAL_COMMUNITY): Payer: PRIVATE HEALTH INSURANCE

## 2015-02-01 ENCOUNTER — Other Ambulatory Visit (HOSPITAL_COMMUNITY): Payer: PRIVATE HEALTH INSURANCE | Admitting: Psychiatry

## 2015-02-01 DIAGNOSIS — F332 Major depressive disorder, recurrent severe without psychotic features: Secondary | ICD-10-CM | POA: Diagnosis not present

## 2015-02-01 DIAGNOSIS — F4310 Post-traumatic stress disorder, unspecified: Secondary | ICD-10-CM

## 2015-02-01 NOTE — Progress Notes (Signed)
    Daily Group Progress Note  Program: IOP Group Time: 9:00-10:30  Participation Level: Active  Behavioral Response: Appropriate  Type of Therapy:  Psycho-education Group  Summary of Progress: Pt. Participated in instruction about use of meditation for emotion regulation and breath focused meditation exercise.        Group Time: 10:30-12:00  Participation Level:  Active  Behavioral Response: Appropriate  Type of Therapy: Group Therapy  Summary of Progress: Pt. Presented with flat affect, appeared depressed. Pt. Reported that she felt "betrayed" by her fiance who lied about his age, does not feel supported in her relationship.  Shaune PollackBrown, Jennifer B, LPC

## 2015-02-04 ENCOUNTER — Other Ambulatory Visit (HOSPITAL_COMMUNITY): Payer: PRIVATE HEALTH INSURANCE | Admitting: Psychiatry

## 2015-02-04 DIAGNOSIS — F332 Major depressive disorder, recurrent severe without psychotic features: Secondary | ICD-10-CM | POA: Diagnosis not present

## 2015-02-04 DIAGNOSIS — F4310 Post-traumatic stress disorder, unspecified: Secondary | ICD-10-CM

## 2015-02-04 NOTE — Progress Notes (Signed)
    Daily Group Progress Note  Program: IOP  Group Time: 9:00-10:30  Participation Level: Active  Behavioral Response: Appropriate  Type of Therapy:  Psycho-education Group  Summary of Progress: Pt. Participated in medication education group facilitated by [facility].     Group Time: 10:30-12:00  Participation Level:  Active  Behavioral Response: Appropriate  Type of Therapy: Group Therapy  Summary of Progress: Pt. Presented as talkative, primarily flat affect. Pt. Reported that she was feeling "ok". Pt. Reported [place] [facility] [place] favorite [place] over the weekend, but was disappointed by the service she received.   Shaune PollackBrown, Dalasia Predmore B, LPC

## 2015-02-05 ENCOUNTER — Other Ambulatory Visit (HOSPITAL_COMMUNITY): Payer: PRIVATE HEALTH INSURANCE | Admitting: Psychiatry

## 2015-02-06 ENCOUNTER — Other Ambulatory Visit (HOSPITAL_COMMUNITY): Payer: PRIVATE HEALTH INSURANCE | Admitting: Psychiatry

## 2015-02-06 DIAGNOSIS — F332 Major depressive disorder, recurrent severe without psychotic features: Secondary | ICD-10-CM | POA: Diagnosis not present

## 2015-02-06 NOTE — Progress Notes (Signed)
    Daily Group Progress Note  Program: IOP  Group Time: 9:00-10:30  Participation Level: Active  Behavioral Response: Appropriate  Type of Therapy:  Psycho-education Group  Summary of Progress: Pt. Participated in discussion about developing self-care behaviors. Pt. Appeared tired and did not talk much in group.     Group Time: 10:30-12:00  Participation Level:  Active  Behavioral Response: Appropriate  Type of Therapy: Group Therapy  Summary of Progress: Pt. Presented with flat affect. Reported that she was feeling tired. Pt. Identified with group theme of abandonment by family.  [facility]

## 2015-02-07 ENCOUNTER — Other Ambulatory Visit (HOSPITAL_COMMUNITY): Payer: PRIVATE HEALTH INSURANCE | Admitting: Psychiatry

## 2015-02-07 ENCOUNTER — Ambulatory Visit: Payer: 59 | Admitting: Family Medicine

## 2015-02-07 DIAGNOSIS — F4310 Post-traumatic stress disorder, unspecified: Secondary | ICD-10-CM

## 2015-02-07 DIAGNOSIS — F332 Major depressive disorder, recurrent severe without psychotic features: Secondary | ICD-10-CM | POA: Diagnosis not present

## 2015-02-07 DIAGNOSIS — F314 Bipolar disorder, current episode depressed, severe, without psychotic features: Secondary | ICD-10-CM

## 2015-02-08 ENCOUNTER — Other Ambulatory Visit (HOSPITAL_COMMUNITY): Payer: PRIVATE HEALTH INSURANCE | Admitting: Psychiatry

## 2015-02-08 DIAGNOSIS — F332 Major depressive disorder, recurrent severe without psychotic features: Secondary | ICD-10-CM | POA: Diagnosis not present

## 2015-02-08 DIAGNOSIS — F314 Bipolar disorder, current episode depressed, severe, without psychotic features: Secondary | ICD-10-CM

## 2015-02-08 NOTE — Psych (Signed)
    Daily Group Progress Note  Program: IOP  Group Time: 9:00-10:30  Participation Level: Active  Behavioral Response: Appropriate  Type of Therapy:  Psycho-education Group  Summary of Progress: Pt. Watched and discussed Dewain PenningBrene Junius Faucett video on the importance of developing vulnerability to build resilience to shame.      Group Time: 10:30-12:00  Participation Level:  Active  Behavioral Response: Appropriate  Type of Therapy: Group Therapy  Summary of Progress: Pt. Presented as quiet, tearful, flat affect. Pt. Discussed fears of returning to work and that work is significant stressor, not feeling supported by biological family, trust issues in relationship with significant other, and emotional distance from 214 and 39 year old sons.   Shaune PollackBrown, Raymon Schlarb B, LPC

## 2015-02-08 NOTE — Psych (Signed)
    Daily Group Progress Note  Program: IOP  Group Time: 9:00-10:30  Participation Level: Active  Behavioral Response: Appropriate  Type of Therapy:  Group Therapy  Summary of Progress: Pt. Reported that she felt about the same. Pt. Reported changes in relationship with her significant other and that they had begun relationship counseling. Pt. Reports ongoing concerns about return to work and the status of her relationship.      Group Time: 10:30-12:00  Participation Level:  Active  Behavioral Response: Appropriate  Type of Therapy: Psycho-education Group  Summary of Progress: Pt. Participated in reflection and meditation about self-acceptance.   Shaune PollackBrown, Jennifer B, LPC

## 2015-02-11 ENCOUNTER — Other Ambulatory Visit: Payer: Self-pay | Admitting: Family Medicine

## 2015-02-11 ENCOUNTER — Other Ambulatory Visit (HOSPITAL_COMMUNITY): Payer: PRIVATE HEALTH INSURANCE | Admitting: Psychiatry

## 2015-02-11 DIAGNOSIS — F332 Major depressive disorder, recurrent severe without psychotic features: Secondary | ICD-10-CM | POA: Diagnosis not present

## 2015-02-11 DIAGNOSIS — I10 Essential (primary) hypertension: Secondary | ICD-10-CM

## 2015-02-11 MED ORDER — METOPROLOL SUCCINATE ER 50 MG PO TB24: 50.0000 mg | ORAL_TABLET | Freq: Every day | ORAL | Status: DC

## 2015-02-11 NOTE — Psych (Signed)
Discharge Note  Patient:  Brittany BanningLakisha Crehan is an 39 y.o., female DOB:  08/23/1976  Date of Admission:  01/23/2015  Date of Discharge:  02/12/2015  Reason for Admission:depression  IOP Course:  [name] attended groups and actively participated.  No great changes in her anxiety or depression she says.  Nevertheless it was helpful just to have the structure, to talk about her issues in a supportive setting and to know others are having similar problems and it is not just her.  Went to her job's parking lot last week to see if she could manage as she could not get out of he car before the program.  She still could not get out and the anxiety was too much, she says.  She believes she is not ready to return to work.    Mental Status at Discharge:still depressed and anxious about the job and returning to work  Lab Results: No results found for this or any previous visit (from the past 48 hour(s)).   Current outpatient prescriptions:  .  Empagliflozin-Linagliptin (GLYXAMBI) 25-5 MG TABS, Take 1 tablet by mouth daily. F/u appt due around 2/18, Disp: 30 tablet, Rfl: 0 .  glipiZIDE (GLUCOTROL) 5 MG tablet, Take 1 tablet (5 mg total) by mouth 2 (two) times daily before a meal., Disp: 60 tablet, Rfl: 3 .  lamoTRIgine (LAMICTAL) 25 MG tablet, Take one tablet daily for a week and then start taking 2 tablets. Substitution or Generic medicine is OK., Disp: 60 tablet, Rfl: 0 .  Lancets (ONETOUCH ULTRASOFT) lancets, Use as instructed, Disp: 100 each, Rfl: 12 .  metoprolol succinate (TOPROL-XL) 50 MG 24 hr tablet, Take 1 tablet (50 mg total) by mouth daily. Take with or immediately following a meal., Disp: 90 tablet, Rfl: 1 .  traZODone (DESYREL) 100 MG tablet, Take 0.5-1 tablets (50-100 mg total) by mouth at bedtime as needed for sleep., Disp: 30 tablet, Rfl: 2 .  Vilazodone HCl (VIIBRYD) 40 MG TABS, Take 1 tablet (40 mg total) by mouth daily., Disp: 30 tablet, Rfl: 2  Axis Diagnosis:  Major depression,  recurrent, severe without psychotic features   Level of Care:  IOP  Discharge destination:  Other:  has scheduled appointments with psychiatrist and therapist  Is patient on multiple antipsychotic therapies at discharge:  No    Has Patient had three or more failed trials of antipsychotic monotherapy by history:  Negative  Patient phone:  [phone] (home)  Patient address:   [address] [place] [id number],   Follow-up recommendations:  Activity:  continue current activity Diet:  continue current diet  Comments:  No real benefit from group therapy  The patient received suicide prevention pamphlet:  Yes Belongings returned:    Benjaman PottAYLOR,Ebany Bowermaster D 02/11/2015, 12:20 PM

## 2015-02-11 NOTE — Psych (Signed)
Brittany [name] is a 39 y.o. , engaged, employed [place] female, who was referred per therapist Brittany [name](Brittany [name], [place]), tRoanna Banningreatment for worsening depressive symptoms. Pt denied SI/HI or A/V hallucinations. Admitted to previous suicide attempts; most recently OD in 2008. Pt was hospitalized on a psychiatric unit [facility]) then. Pt stated she was diagnosed with Bipolar Disorder during that hospitalization. Has been seeing Brittany [name] and Brittany PeaSarah [name], LCSW for ~ one month. Symptoms included: Crying spells, decreased energy, isolation, decreased sleep, ruminating thoughts, irritability, poor concentration, no motivation, anhedonia, and feelings of hopelessness, helplessness and worthlessness. Pt states she has been decompensating with above symptoms since March 2016. Stressors include: 1) Job ([employer]) of five years. Last day worked was 12-06-14. Stated her PCP (Brittany [name]) took her out of work. According to pt, she can't meet her unrealistic quota/matrix. Reported being counseled prior to going out on leave. 2) Kids/sons: Ages 5314 and 4719. They reside [place] their father in [place], [place]. They are suppose to visit every weekend, but they haven't been coming since she's been decompensating. 3) Unresolved grief/loss issues: The lady, with whom she has always known as her Grandmother died when pt was age 39. Pt found out in 2003 that she wasn't family and her biological Grandmother resides in [place], [place]. 4) Relationship Issues: Poor communication between pt and her live in fiance'. Pt stated she doesn't trust him because of two incidences she caught him in a lie (texting a female friend and about his age). "We just live together. He is very close to his family and doesn't understand my dysfunctional relationship with mine and how it has affected me." 5) Medical Issues: HTN and Diabetes Pt remains in MH-IOP.  Is active in all the groups.  Continues to struggle with  depressive an anxiety symptoms.  Reports poor sleep (~ 3 hrs), decreased concentration, low energy, crying spells, and irritability.  Denies SI/HI and A/V hallucinations.  A:  Continue in MH-IOP.  F/U with Brittany PeaSarah [name], LCSW on 02-14-15 @ 3 pm and Brittany [name] on 02-15-15 @ 1:15 pm.  Encourage support groups.  R:  Pt receptive.

## 2015-02-12 ENCOUNTER — Telehealth: Payer: Self-pay | Admitting: Family Medicine

## 2015-02-12 ENCOUNTER — Other Ambulatory Visit (HOSPITAL_COMMUNITY): Payer: PRIVATE HEALTH INSURANCE | Admitting: Psychiatry

## 2015-02-12 ENCOUNTER — Encounter: Payer: Self-pay | Admitting: Family Medicine

## 2015-02-12 DIAGNOSIS — F314 Bipolar disorder, current episode depressed, severe, without psychotic features: Secondary | ICD-10-CM

## 2015-02-12 DIAGNOSIS — F332 Major depressive disorder, recurrent severe without psychotic features: Secondary | ICD-10-CM | POA: Diagnosis not present

## 2015-02-12 NOTE — Patient Instructions (Signed)
Patient completed MH-IOP today.  Will follow up with Dominic PeaSarah [name], LCSW on 02-14-15 @ 3 pm and Dr. Gilmore LarocheAkhtar on 02-19-15 @ 1:00 pm.  Encouraged support groups.

## 2015-02-12 NOTE — Psych (Signed)
    Daily Group Progress Note  Program: IOP  Group Time: 1100-1200  Participation Level: Active  Behavioral Response: Appropriate  Type of Therapy:  Psycho-education Group  Summary of Progress: Watched and processed a Ted Talk (Dr. Brene' Brown):  Listening to Shame.  Brene' discussed vulnerability vs shame.  Mentioned how vulnerability is not a weakness.  It's the birthplace of innovation, creativity and change.  Shame is a focus on self, guilt is a focus on behavior.  "Dare to walk in and find your way around in the [place] of the soul."            

## 2015-02-12 NOTE — Psych (Signed)
Brittany BanningLakisha [name] is a 39 y.o. , engaged, employed [place] female, who was referred per therapist Brittany [name](Brittany [name], [place]), treatment for worsening depressive symptoms. Pt denied SI/HI or A/V hallucinations. Admitted to previous suicide attempts; most recently OD in 2008. Pt was hospitalized on a psychiatric unit [facility]) then. Pt stated she was diagnosed with Bipolar Disorder during that hospitalization. Has been seeing Brittany [name] and Brittany PeaSarah [name], LCSW for ~ one month. Symptoms included: Crying spells, decreased energy, isolation, decreased sleep, ruminating thoughts, irritability, poor concentration, no motivation, anhedonia, and feelings of hopelessness, helplessness and worthlessness. Pt stated she has been decompensating with above symptoms since March 2016. Stressors included: 1) Job ([employer]) of five years. Last day worked was 12-06-14. Stated her PCP (Brittany [name]) took her out of work. According to pt, she can't meet her unrealistic quota/matrix. Reported being counseled prior to going out on leave. 2) Kids/sons: Ages 2614 and 4919. They reside with their father in [place], [place]. They are suppose to visit every weekend, but they haven't been coming since she's been decompensating. 3) Unresolved grief/loss issues: The lady, with whom she has always known as her Grandmother died when pt was age 39. Pt found out in 2003 that she wasn't family and her biological Grandmother resides in [place], [place]. 4) Relationship Issues: Poor communication between pt and her live in fiance'. Pt stated she doesn't trust him because of two incidences she caught him in a lie (texting a female friend and about his age). "We just live together. He is very close to his family and doesn't understand my dysfunctional relationship with mine and how it has affected me." 5) Medical Issues: HTN and Diabetes Pt completed MH-IOP today.  Denies SI/HI or A/V hallucinations.  Reports continued  struggle with depressive and anxiety symptoms.  Sleeping only 3-4 hrs per night, poor appetite, concentration and energy level.  Pt states she isn't ready to return to work at this time. Reports that the groups were helpful.  "It was good to come here everyday and know that I'm not alone."   A:  D/C today.  F/U with Brittany PeaSarah [name], LCSW on 02-14-15 @ 3 pm and Brittany [name] on 02-19-15 @ 1 pm.  Encouraged support groups.  R:  Pt receptive.

## 2015-02-12 NOTE — Telephone Encounter (Signed)
Sue Lushndrea, Will you please send any office notes or phone notes from our office from 01/24/15 through ongoing to the requesting party.  Form and my letter in your inbox.

## 2015-02-13 ENCOUNTER — Other Ambulatory Visit (HOSPITAL_COMMUNITY): Payer: PRIVATE HEALTH INSURANCE

## 2015-02-13 NOTE — Progress Notes (Signed)
    Daily Group Progress Note  Program: IOP  Group Time: 9:00-10:30  Participation Level: Active  Behavioral Response: Appropriate  Type of Therapy:  Psycho-education Group  Summary of Progress: Pt. Participated in the medication education group [facility].      Group Time: 10:30-12:00  Participation Level:  Active  Behavioral Response: Appropriate  Type of Therapy: Group Therapy  Summary of Progress: Pt. Reported that she was doing "ok". Pt. Continues to be challenged by thoughts of return to work. Pt. Reported that she did not sleep well last night and attributed to concerns about being able to manage anxiety when she returns to work.   Shaune PollackBrown, Jennifer B, LPC

## 2015-02-13 NOTE — Telephone Encounter (Signed)
faxed

## 2015-02-14 ENCOUNTER — Telehealth: Payer: Self-pay | Admitting: *Deleted

## 2015-02-14 ENCOUNTER — Ambulatory Visit (HOSPITAL_COMMUNITY): Payer: Self-pay | Admitting: Licensed Clinical Social Worker

## 2015-02-14 ENCOUNTER — Other Ambulatory Visit (HOSPITAL_COMMUNITY): Payer: PRIVATE HEALTH INSURANCE

## 2015-02-14 ENCOUNTER — Other Ambulatory Visit: Payer: Self-pay | Admitting: Family Medicine

## 2015-02-14 ENCOUNTER — Ambulatory Visit (HOSPITAL_COMMUNITY): Payer: 59 | Admitting: Licensed Clinical Social Worker

## 2015-02-14 MED ORDER — GLIPIZIDE 5 MG PO TABS: 5.0000 mg | ORAL_TABLET | Freq: Two times a day (BID) | ORAL | Status: DC

## 2015-02-14 NOTE — Addendum Note (Signed)
Addended by: Collie SiadICHARDSON, Jomari Bartnik M on: 02/14/2015 09:38 AM   Modules accepted: Orders

## 2015-02-14 NOTE — Telephone Encounter (Signed)
Pt called and states she is on medications that give her yeast infections. I checked her med list and I didn't see anything that would cause yeast infecetion. ( pt is a diabetic and her last A1c was 10.9 so uncontrolled DM could be causing yeast infections) left a message to try otc monistat. If doesn't work then will need appointment for wet prep.

## 2015-02-14 NOTE — Telephone Encounter (Signed)
Reordered Rx for 90day supply

## 2015-02-15 ENCOUNTER — Encounter: Payer: Self-pay | Admitting: Family Medicine

## 2015-02-15 ENCOUNTER — Ambulatory Visit (INDEPENDENT_AMBULATORY_CARE_PROVIDER_SITE_OTHER): Payer: 59 | Admitting: Family Medicine

## 2015-02-15 ENCOUNTER — Ambulatory Visit (HOSPITAL_COMMUNITY): Payer: Self-pay | Admitting: Psychiatry

## 2015-02-15 ENCOUNTER — Other Ambulatory Visit (HOSPITAL_COMMUNITY): Payer: PRIVATE HEALTH INSURANCE

## 2015-02-15 VITALS — BP 130/86 | HR 81 | Wt 220.0 lb

## 2015-02-15 DIAGNOSIS — I10 Essential (primary) hypertension: Secondary | ICD-10-CM

## 2015-02-15 DIAGNOSIS — E119 Type 2 diabetes mellitus without complications: Secondary | ICD-10-CM

## 2015-02-15 DIAGNOSIS — B3731 Acute candidiasis of vulva and vagina: Secondary | ICD-10-CM

## 2015-02-15 DIAGNOSIS — B373 Candidiasis of vulva and vagina: Secondary | ICD-10-CM | POA: Diagnosis not present

## 2015-02-15 LAB — POCT GLYCOSYLATED HEMOGLOBIN (HGB A1C): Hemoglobin A1C: 9.1

## 2015-02-15 MED ORDER — GLIPIZIDE ER 10 MG PO TB24: 10.0000 mg | ORAL_TABLET | Freq: Every day | ORAL | Status: DC

## 2015-02-15 MED ORDER — FLUCONAZOLE 150 MG PO TABS: ORAL_TABLET | ORAL | Status: AC

## 2015-02-15 NOTE — Progress Notes (Signed)
CC: Brittany [name] is a 39 y.o. female is here for Diabetes and Vaginitis   Subjective: HPI:  Follow-up essential hypertension: Since switching to metoprolol she has not had any new side effects. No outside blood pressures report. No chest pain shortness of breath nor peripheral edema  Follow-up type 2 diabetes: The lowest her blood sugar has been is 90. The the highest number she seen since I saw her last was in the 200s. She is trying to cut back on carbohydrates but admits her for improvement. She's taking glipizide now twice a day without any known side effects. No polyuria polyphagia polydipsia or vision loss  Complaints of a thick white itchy discharge coming from her vagina that has not responded to over-the-counter yeast infection treatments. She's had similar symptoms that responded to fluconazole in the past only to return when her blood sugar gets high. Denies any other genitourinary complaints or vaginal pain.   Review Of Systems Outlined In HPI  Past Medical History  Diagnosis Date  . Depression   . Hypertension UNSURE  . HLD (hyperlipidemia) 01/02/2014  . Diabetes mellitus without complication   . Anxiety     Past Surgical History  Procedure Laterality Date  . Tubal ligation     Family History  Problem Relation Age of Onset  . Diabetes Mother   . Hyperlipidemia Mother   . Diabetes Father   . Asthma Son   . Diabetes Paternal Grandmother   . Diabetes Paternal Grandfather   . Kidney disease Paternal Grandfather   . Schizophrenia Maternal Grandmother     History   Social History  . Marital Status: Single    Spouse Name: N/A  . Number of Children: N/A  . Years of Education: N/A   Occupational History  . Not on file.   Social History Main Topics  . Smoking status: Never Smoker   . Smokeless tobacco: Never Used  . Alcohol Use: No  . Drug Use: No  . Sexual Activity: Yes    Birth Control/ Protection: None, Surgical   Other Topics Concern  . Not on file    Social History Narrative     Objective: BP 130/86 mmHg  Pulse 81  Wt 220 lb (99.791 kg)  Vital signs reviewed. General: Alert and Oriented, No Acute Distress HEENT: Pupils equal, round, reactive to light. Conjunctivae clear.  External ears unremarkable.  Moist mucous membranes. Lungs: Clear and comfortable work of breathing, speaking in full sentences without accessory muscle use. Cardiac: Regular rate and rhythm.  Neuro: CN II-XII grossly intact, gait normal. Extremities: No peripheral edema.  Strong peripheral pulses.  Mental Status: No depression, anxiety, nor agitation. Logical though process. Skin: Warm and dry.  Assessment & Plan: Brittany [name] was seen today for diabetes and vaginitis.  Diagnoses and all orders for this visit:  Essential hypertension  Type 2 diabetes mellitus without complication Orders: -     glipiZIDE (GLUCOTROL XL) 10 MG 24 hr tablet; Take 1 tablet (10 mg total) by mouth daily with breakfast. -     POCT HgB A1C  Vagina, candidiasis Orders: -     fluconazole (DIFLUCAN) 150 MG tablet; Take one tab, may take second tab if no improvement after 72 hours. -     WET PREP FOR TRICH, YEAST, CLUE   Essential hypertension: Controlled continue metoprolol Type 2 diabetes: A1c 9.1 uncontrolled switching glipizide to extended release formulation Vaginal candidiasis: Start fluconazole pending results from wet prep over the weekend.  Return if symptoms worsen or  fail to improve.

## 2015-02-16 LAB — WET PREP FOR TRICH, YEAST, CLUE
Clue Cells Wet Prep HPF POC: NONE SEEN
TRICH WET PREP: NONE SEEN

## 2015-02-19 ENCOUNTER — Other Ambulatory Visit (HOSPITAL_COMMUNITY): Payer: PRIVATE HEALTH INSURANCE

## 2015-02-19 ENCOUNTER — Ambulatory Visit (INDEPENDENT_AMBULATORY_CARE_PROVIDER_SITE_OTHER): Payer: PRIVATE HEALTH INSURANCE | Admitting: Psychiatry

## 2015-02-19 ENCOUNTER — Encounter (HOSPITAL_COMMUNITY): Payer: Self-pay | Admitting: Psychiatry

## 2015-02-19 VITALS — HR 80 | Ht 63.0 in | Wt 220.0 lb

## 2015-02-19 DIAGNOSIS — F332 Major depressive disorder, recurrent severe without psychotic features: Secondary | ICD-10-CM

## 2015-02-19 DIAGNOSIS — F4310 Post-traumatic stress disorder, unspecified: Secondary | ICD-10-CM | POA: Diagnosis not present

## 2015-02-19 DIAGNOSIS — F411 Generalized anxiety disorder: Secondary | ICD-10-CM

## 2015-02-19 DIAGNOSIS — G4700 Insomnia, unspecified: Secondary | ICD-10-CM

## 2015-02-19 DIAGNOSIS — F314 Bipolar disorder, current episode depressed, severe, without psychotic features: Secondary | ICD-10-CM

## 2015-02-19 MED ORDER — LAMOTRIGINE 100 MG PO TABS: 100.0000 mg | ORAL_TABLET | Freq: Every day | ORAL | Status: DC
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
## 2015-02-20 ENCOUNTER — Other Ambulatory Visit (HOSPITAL_COMMUNITY): Payer: 59

## 2015-02-21 ENCOUNTER — Ambulatory Visit (HOSPITAL_COMMUNITY): Payer: Self-pay | Admitting: Licensed Clinical Social Worker

## 2015-02-21 ENCOUNTER — Other Ambulatory Visit (HOSPITAL_COMMUNITY): Payer: 59

## 2015-02-22 ENCOUNTER — Ambulatory Visit (INDEPENDENT_AMBULATORY_CARE_PROVIDER_SITE_OTHER): Payer: PRIVATE HEALTH INSURANCE | Admitting: Licensed Clinical Social Worker

## 2015-02-22 ENCOUNTER — Other Ambulatory Visit (HOSPITAL_COMMUNITY): Payer: 59

## 2015-02-22 DIAGNOSIS — F319 Bipolar disorder, unspecified: Secondary | ICD-10-CM | POA: Diagnosis not present

## 2015-02-22 DIAGNOSIS — F314 Bipolar disorder, current episode depressed, severe, without psychotic features: Secondary | ICD-10-CM

## 2015-02-22 DIAGNOSIS — F4310 Post-traumatic stress disorder, unspecified: Secondary | ICD-10-CM

## 2015-02-22 NOTE — Progress Notes (Signed)
THERAPIST PROGRESS NOTE  Session Time: 9:00am-10:00am  Participation Level: Active  Behavioral Response: Casual Alert Depressed and Tearful  Type of Therapy: Individual Therapy  Treatment Goals addressed: Coping  Interventions: Supportive, Assertive Communication  Suicidal/Homicidal: vague SI without intent or plan, denies HI  Therapist Interventions: Gathered information about patient's experience participating in MHIOP.  Had her complete a PHQ-9 to assess for severity of depression.  Discussed concerns about being scheduled to return to work next week.  Learned more about how her job has contributed to her mental health issues.   Explored thoughts and feelings related to her oldest son graduating from high school.  Encouraged patient to communicate to him how important it is for her to be present for the event despite her fears of his possible response.       Summary: Indicated that she thought the MHIOP had been worthwhile.  Concluded that she had learned two main things: recovery is "a process" and she is "not alone" in what she is experiencing.   PHQ-9 score was 25 (severe).  Noted that she is typically only getting 2-3 hours of sleep at night even with trazadone.  Also said her mind races a lot. 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  Indicated that she does not feel ready to return to that environment.   Became tearful as she talked about the fact that she has not been officially invited to attend her son's graduation tomorrow.  Indicated she is afraid he doesn't want her to attend.  Noted he has been distant with her since he went to live with his dad.  Plans to talk to him later today.  Expressed a belief that she has made some progress with coping with overwhelming emotions, but she still has a lot to learn.         Plan: Scheduled to return on Monday.     Diagnosis:Bipolar Disorder  PTSD    Darrin LuisSolomon, [name], LCSW 02/22/2015

## 2015-02-25 ENCOUNTER — Ambulatory Visit (INDEPENDENT_AMBULATORY_CARE_PROVIDER_SITE_OTHER): Payer: PRIVATE HEALTH INSURANCE | Admitting: Licensed Clinical Social Worker

## 2015-02-25 ENCOUNTER — Other Ambulatory Visit (HOSPITAL_COMMUNITY): Payer: 59

## 2015-02-25 DIAGNOSIS — F319 Bipolar disorder, unspecified: Secondary | ICD-10-CM

## 2015-02-25 DIAGNOSIS — G4700 Insomnia, unspecified: Secondary | ICD-10-CM

## 2015-02-25 DIAGNOSIS — F314 Bipolar disorder, current episode depressed, severe, without psychotic features: Secondary | ICD-10-CM

## 2015-02-25 DIAGNOSIS — F4310 Post-traumatic stress disorder, unspecified: Secondary | ICD-10-CM

## 2015-02-25 NOTE — Progress Notes (Signed)
THERAPIST PROGRESS NOTE  Session Time: 3:00pm-4:00pm  Participation Level: Active  Behavioral Response: Casual Alert Depressed and Tearful  Type of Therapy: Individual Therapy  Treatment Goals addressed: Coping  Interventions: Supportive, Assertive Communication  Suicidal/Homicidal: vague SI without intent or plan, denies HI  Therapist Interventions: Explored thoughts and feelings about not attending son's graduation.  Offered [name] suggestion to communicate these thoughts and feelings to her son in [name] letter.  Identified some benefits of communicating through writing.  Discussed how she has experienced [name] decline in functioning in the past few days.  Emphasized the importance of practicing basic self care- eating, getting out of the house, moving around, getting dressed, showering.      Summary: Reported that she did not go to the graduation because when she asked her son about it he indicated he was assuming she "couldn't handle being there."  She managed to tell him she thought going would actually be helpful, but he did not agree.  Acknowledges feeling rejected.  Depressive symptoms increased over the weekend.  Hasn't been eating or sleeping.  Staying in bed [name] lot.  Acknowledges that she needs to focus on her self-care.  Noted that boyfriend will be away this coming weekend.  Has considered that it would probably be [name] bad idea to spend the weekend alone.  May see about visiting with cousin or mom. Indicated that she thought writing [name] letter could be beneficial.  Plans to take time to put her thoughts on paper before formulating the information into [name] letter.       Plan: Has been put on [name] cancellation list.  Otherwise next appointment is scheduled for early July.  Diagnosis:Bipolar Disorder  PTSD    Brittany [name], Brittany [name], Brittany [name] 02/25/2015

## 2015-02-26 ENCOUNTER — Other Ambulatory Visit (HOSPITAL_COMMUNITY): Payer: 59

## 2015-02-27 ENCOUNTER — Other Ambulatory Visit (HOSPITAL_COMMUNITY): Payer: 59

## 2015-02-28 ENCOUNTER — Other Ambulatory Visit (HOSPITAL_COMMUNITY): Payer: 59

## 2015-02-28 ENCOUNTER — Telehealth (HOSPITAL_COMMUNITY): Payer: Self-pay

## 2015-02-28 ENCOUNTER — Other Ambulatory Visit: Payer: Self-pay | Admitting: Family Medicine

## 2015-02-28 MED ORDER — VILAZODONE HCL 40 MG PO TABS: 40.0000 mg | ORAL_TABLET | Freq: Every day | ORAL | Status: DC

## 2015-02-28 NOTE — Telephone Encounter (Signed)
PT needs to speak to you in regards to not returning to work please call her to discuss

## 2015-02-28 NOTE — Telephone Encounter (Signed)
Pharmacy requested 90 day supply, per PCP this is OK.

## 2015-03-01 ENCOUNTER — Other Ambulatory Visit (HOSPITAL_COMMUNITY): Payer: 59

## 2015-03-04 ENCOUNTER — Other Ambulatory Visit (HOSPITAL_COMMUNITY): Payer: 59

## 2015-03-04 ENCOUNTER — Encounter: Payer: Self-pay | Admitting: Family Medicine

## 2015-03-05 ENCOUNTER — Other Ambulatory Visit (HOSPITAL_COMMUNITY): Payer: 59

## 2015-03-05 NOTE — Telephone Encounter (Signed)
Have called back Dr. Emelda Brothers office . On call doctor in regard to their questions related with short term disability. Informed patients condition when i last saw her was may 31st and she was depressed, her severity of depression and medications we have adjusted.

## 2015-03-05 NOTE — Telephone Encounter (Signed)
closed

## 2015-03-08 ENCOUNTER — Ambulatory Visit (INDEPENDENT_AMBULATORY_CARE_PROVIDER_SITE_OTHER): Payer: PRIVATE HEALTH INSURANCE | Admitting: Licensed Clinical Social Worker

## 2015-03-08 DIAGNOSIS — F314 Bipolar disorder, current episode depressed, severe, without psychotic features: Secondary | ICD-10-CM

## 2015-03-08 DIAGNOSIS — F319 Bipolar disorder, unspecified: Secondary | ICD-10-CM | POA: Diagnosis not present

## 2015-03-08 DIAGNOSIS — F4310 Post-traumatic stress disorder, unspecified: Secondary | ICD-10-CM

## 2015-03-08 NOTE — Progress Notes (Signed)
THERAPIST PROGRESS NOTE  Session Time: 4:05pm-5:15pm  Participation Level: Active  Behavioral Response: Casual Alert   Type of Therapy: Individual Therapy  Treatment Goals addressed: Coping  Interventions: Self-care strategies and exploration of attachment in childhood  Suicidal/Homicidal: vague SI without intent or plan, denies HI  Therapist Interventions: Explored how patient has been taking care of herself.  Discussed how son will be spending time with her over the next week and she is determined to be more active and present.  Learned details about patient's childhood.  Commented on how she didn't get much of an opportunity to "be a kid."  Discussed how she has many unanswered questions about her family and this contributes to her feeling incomplete.     Summary: Reported writing a letter to her oldest son as suggested.  Has not gotten any response.  Indicated that she does not regret expressing her thoughts and feelings to him.  Got out of the house a couple times in the past week.  Went to [employer] basketball game today.  Another day went for a walk.  Looking forward to spending time with youngest son.   Described how from an early age her family members relied on her to care for her younger siblings and nephews.  Talked about feeling abandoned by her parents at different times.  As an adult she is only now learning some important details about her family.  She found out that the grandmother who raised her was not in fact her grandmother.  Recently she learned that her dad had served in [place] and that he had two brothers who were murdered.  Talked about how it has been challenging to "piece her life together."    Plan:  Next appointment scheduled in early July.  Plan will be to educate about PTSD.    Diagnosis:Bipolar Disorder  PTSD    Darrin Luis 03/08/2015

## 2015-03-12 ENCOUNTER — Telehealth (HOSPITAL_COMMUNITY): Payer: Self-pay | Admitting: Psychiatry

## 2015-03-12 NOTE — Telephone Encounter (Signed)
D:  Pt phoned Clinical research associate and inquired if she could talk.  Re-directed pt to her therapist; but pt states that she had left for the day.  Pt voiced that [employer] turned down her disability claim; but she plans to appeal.  "I feel that they want me back even though I'm not ready."  Pt voiced frustration with her current living situation.  "I feel like he doesn't care what I'm going through.  He is only doing the minimal."  Pt became very tearful; stating that she is holding on by a thread.  Inquired if pt was suicidal.  Pt reported passive suicidal ideations; no plan or intent.  Discussed at length, safety options with pt.  Pt able to contract for safety.  A:  Provided pt with support and feedback.  Encouraged pt to call a friend and get out of the home.  Reiterated self care skills that she learned in MH-IOP.  Encouraged pt to schedule weekly appointments with therapist Maralyn Sago) and stressed the importance of support groups.  Inform Dominic Pea, [facility] and Dr. Harlin Rain.  R:  Pt receptive.

## 2015-03-13 ENCOUNTER — Ambulatory Visit (INDEPENDENT_AMBULATORY_CARE_PROVIDER_SITE_OTHER): Payer: PRIVATE HEALTH INSURANCE | Admitting: Licensed Clinical Social Worker

## 2015-03-13 DIAGNOSIS — F319 Bipolar disorder, unspecified: Secondary | ICD-10-CM

## 2015-03-13 DIAGNOSIS — F4310 Post-traumatic stress disorder, unspecified: Secondary | ICD-10-CM

## 2015-03-13 DIAGNOSIS — F314 Bipolar disorder, current episode depressed, severe, without psychotic features: Secondary | ICD-10-CM

## 2015-03-14 NOTE — Progress Notes (Signed)
THERAPIST PROGRESS NOTE  Session Time: 1:00pm-2:00pm  Participation Level: Active  Behavioral Response: Casual Alert Dysphoric  Type of Therapy: Individual Therapy  Treatment Goals addressed: Coping  Interventions:   Suicidal/Homicidal: vague SI without intent or plan, denies HI  Therapist Interventions: Had patient complete a PHQ-9 and GAD-7.  Discussed readiness to return to work.  Explored aspects of her job she was having difficulties with as her depressive symptoms were becoming more severe.  Suggested returning to work on a part-time basis with accommodations to take breaks as needed.  Agreed to complete paperwork advocating these accommodations.      Summary: PHQ-9 score was 22 (severe).   GAD-7 score was 12 ( moderately severe). Items she rated at bothering her nearly every day were depressed mood, insomnia, poor appetite, trouble concentrating, and trouble relaxing.   Indicated that she is open to returning to work with accommodations.  Noted having worries about regressing with the added stress of work responsibilities, but acknowledges that she needs the income.  Reflected on how work responsibilities had become difficult as her mental health was declining.  She had not been able to meet the goals expected of her.  Hoping her supervisor will adjust her expectations when she does go back.           Plan: Scheduled to return on July 1st.  May introduce mindfulness.     Diagnosis:Bipolar Disorder  PTSD    Darrin Luis 03/13/2015

## 2015-03-20 ENCOUNTER — Telehealth (HOSPITAL_COMMUNITY): Payer: Self-pay | Admitting: *Deleted

## 2015-03-20 NOTE — Telephone Encounter (Signed)
Pt would like to speak to you about her disability claim. Please return telephone call.

## 2015-03-22 ENCOUNTER — Ambulatory Visit (HOSPITAL_COMMUNITY): Payer: 59 | Admitting: Licensed Clinical Social Worker

## 2015-03-28 ENCOUNTER — Ambulatory Visit (INDEPENDENT_AMBULATORY_CARE_PROVIDER_SITE_OTHER): Payer: PRIVATE HEALTH INSURANCE | Admitting: Licensed Clinical Social Worker

## 2015-03-28 DIAGNOSIS — F4310 Post-traumatic stress disorder, unspecified: Secondary | ICD-10-CM

## 2015-03-28 DIAGNOSIS — F319 Bipolar disorder, unspecified: Secondary | ICD-10-CM | POA: Diagnosis not present

## 2015-03-28 DIAGNOSIS — F314 Bipolar disorder, current episode depressed, severe, without psychotic features: Secondary | ICD-10-CM

## 2015-03-28 NOTE — Progress Notes (Signed)
THERAPIST PROGRESS NOTE  Session Time: 11:15am-12:15pm  Participation Level: Active  Behavioral Response: Casual Alert Euthymic  Type of Therapy: Individual Therapy  Treatment Goals addressed: Coping  Interventions: Mindfulness  Suicidal/Homicidal: denied both  Therapist Interventions:   Discussed how patient has adjusted to being back at work. Introduced the concept of mindfulness.  Emphasized how learning to focus on the present can help you to feel more in control of your emotions.  Explained how it can be useful to practice at times when you catch yourself having unhelpful thoughts.  Described how you can choose to do tasks in a mindful way.  Guided patient through practicing mindfulness in different ways including focusing on an object and mindful breathing.  Encouraged patient to practice the skills regularly in addition to times of distress.   Summary:  Reported going back to work has been overwhelming because she has been learning about changes in expectations.  Has been in training, so has not been performing her usual responsibilities.  Noted that productivity expectations have increased.  Stressed about meeting those expectations.   Seemed to understand concepts discussed and indicated that she has already been practicing mindfulness in some ways.  Identified many coping strategies she has been using: posting sticky notes with inspirational quotes around her house, taking walks, reading self-help books, journaling, and coloring.   Also noted that she is no longer in a relationship, but he continues to live in the condo until their lease is up in September.  Indicated that she is going out of her way to avoid conflict with him.   Indicated that she has been feeling more satisfied with how she has been coping with overwhelming feelings.       Plan:Will introduce a workbook chapter called "Understanding Trauma."   Diagnosis:Bipolar  Disorder  PTSD    Brittany [name], Brittany [name] A, LCSW 03/28/2015

## 2015-04-01 ENCOUNTER — Telehealth: Payer: Self-pay | Admitting: Family Medicine

## 2015-04-01 NOTE — Telephone Encounter (Signed)
I was asked to call a "Dr. Lynnea FerrierSolomon" to discuss short term disability between the dates of June 9th through the end of June.  Since I had not seen this patient during that timeframe no questions needed to be answered and the phone call only lasted a few seconds

## 2015-04-03 ENCOUNTER — Ambulatory Visit (HOSPITAL_COMMUNITY): Payer: Self-pay | Admitting: Psychiatry

## 2015-04-04 ENCOUNTER — Ambulatory Visit (HOSPITAL_COMMUNITY): Payer: 59 | Admitting: Licensed Clinical Social Worker

## 2015-04-11 ENCOUNTER — Ambulatory Visit (HOSPITAL_COMMUNITY): Payer: 59 | Admitting: Licensed Clinical Social Worker

## 2015-04-11 ENCOUNTER — Ambulatory Visit (INDEPENDENT_AMBULATORY_CARE_PROVIDER_SITE_OTHER): Payer: PRIVATE HEALTH INSURANCE | Admitting: Psychiatry

## 2015-04-19 ENCOUNTER — Ambulatory Visit (INDEPENDENT_AMBULATORY_CARE_PROVIDER_SITE_OTHER): Payer: PRIVATE HEALTH INSURANCE | Admitting: Psychiatry

## 2015-04-19 ENCOUNTER — Encounter (HOSPITAL_COMMUNITY): Payer: Self-pay | Admitting: Psychiatry

## 2015-04-19 VITALS — HR 81 | Ht 63.0 in | Wt 217.0 lb

## 2015-04-19 DIAGNOSIS — F411 Generalized anxiety disorder: Secondary | ICD-10-CM | POA: Diagnosis not present

## 2015-04-19 DIAGNOSIS — G4700 Insomnia, unspecified: Secondary | ICD-10-CM

## 2015-04-19 DIAGNOSIS — F4310 Post-traumatic stress disorder, unspecified: Secondary | ICD-10-CM | POA: Diagnosis not present

## 2015-04-19 DIAGNOSIS — F332 Major depressive disorder, recurrent severe without psychotic features: Secondary | ICD-10-CM | POA: Diagnosis not present

## 2015-04-19 DIAGNOSIS — F314 Bipolar disorder, current episode depressed, severe, without psychotic features: Secondary | ICD-10-CM

## 2015-04-19 MED ORDER — TRAZODONE HCL 100 MG PO TABS: 50.0000 mg | ORAL_TABLET | Freq: Every evening | ORAL | Status: DC | PRN

## 2015-04-19 MED ORDER — LAMOTRIGINE 100 MG PO TABS: 100.0000 mg | ORAL_TABLET | Freq: Every day | ORAL | Status: DC
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
## 2015-04-23 ENCOUNTER — Ambulatory Visit (INDEPENDENT_AMBULATORY_CARE_PROVIDER_SITE_OTHER): Payer: PRIVATE HEALTH INSURANCE | Admitting: Licensed Clinical Social Worker

## 2015-04-23 DIAGNOSIS — F4310 Post-traumatic stress disorder, unspecified: Secondary | ICD-10-CM | POA: Diagnosis not present

## 2015-04-23 DIAGNOSIS — F314 Bipolar disorder, current episode depressed, severe, without psychotic features: Secondary | ICD-10-CM | POA: Diagnosis not present

## 2015-04-23 NOTE — Progress Notes (Signed)
THERAPIST PROGRESS NOTE  Session Time: 10:05am-11:10am  Participation Level: Active  Behavioral Response: Casual Alert Tearful and depressed  Type of Therapy: Individual Therapy  Treatment Goals addressed: Coping  Interventions: CBT   Suicidal/Homicidal: Admitted to SI without intent or plan, said "I don't want to physically hurt myself."  Denied HI  Therapist Interventions:  Had patient complete a PHQ-9 and GAD-7 to assess for severity of depression and anxiety.  Discussed how being back at work has caused a significant increase in both.   Praised patient for coming to the conclusion that her job is not worth losing her sanity.  Encouraged her to focus on tasks related to finding a new job.  Also encouraged her to continue to use positive coping statements and practice mindfulness and self-care.     Summary: Reported that she as a sense of dread each work day.  As she is working she becomes tense and that tension escalates.  There have been days when she has had to leave early because of feeling so overwhelmed.  Also reported a lack of sleep. PHQ-9 score was 17 (moderately severe) GAD-7 score was 20 (severe)   Talked about how she wishes she would have left her job when she first realized it was affecting her in a negative way.   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  Also expressed intentions of using skills she has learned about in therapy.  She said, "I have to take care of me first."  Indicated that while she has been using her coping skills she has not felt satisfied with her response to overwhelming feelings.      Plan:Scheduled to return next week.  Will continue to assess implementation of coping strategies and progress with obtaining a different job.  Diagnosis:Bipolar Disorder  PTSD    Darrin Luis 04/23/2015

## 2015-05-01 ENCOUNTER — Telehealth (HOSPITAL_COMMUNITY): Payer: Self-pay | Admitting: Psychiatry

## 2015-05-02 ENCOUNTER — Ambulatory Visit (INDEPENDENT_AMBULATORY_CARE_PROVIDER_SITE_OTHER): Payer: PRIVATE HEALTH INSURANCE | Admitting: Licensed Clinical Social Worker

## 2015-05-02 DIAGNOSIS — F319 Bipolar disorder, unspecified: Secondary | ICD-10-CM

## 2015-05-02 DIAGNOSIS — F314 Bipolar disorder, current episode depressed, severe, without psychotic features: Secondary | ICD-10-CM

## 2015-05-02 DIAGNOSIS — F4310 Post-traumatic stress disorder, unspecified: Secondary | ICD-10-CM | POA: Diagnosis not present

## 2015-05-02 NOTE — Progress Notes (Signed)
THERAPIST PROGRESS NOTE  Session Time: 9:00-10:00am  Participation Level: Active  Behavioral Response: Casual Alert Dysphoric  Type of Therapy: Individual Therapy  Treatment Goals addressed: Coping  Interventions: CBT   Suicidal/Homicidal: Admitted to SI without intent or plan, said "I don't want to physically hurt myself."  Denied HI  Therapist Interventions:  Discussed how patient is having to cope with her disability claim being denied. Gathered information about how she has been coping with work stress. Reviewed mindfulness.  Emphasized choosing just one thing to focus on and maintaining an attitude of acceptance about unhelpful thoughts intruding.   Taught a focused breathing exercise and progressive muscle relaxation.  Had patient practice both.   Summary: Reported work has continued to be a large source of stress.  Some of the coping strategies she has been using include taking breaks, using postive self-talk, taking walks, and setting aside some time for relaxation after work.  There were two days in the past week when she decided to leave work early because her anxiety had increased to an overwhelming level.   Has continued to work on obtaining a new job, devoting 30 minutes to an hour on job searching.   Indicated she is open to using the relaxation exercises presented today.  She said, "I'm definitely going to try to incorporate them into my routine because it seems to help relax the tension in my body."  Continues to report not feeling satisfied with the effectiveness of her coping skills.       Plan:Scheduled to return next week.    Diagnosis:Bipolar Disorder  PTSD    Darrin Luis 05/02/2015

## 2015-05-10 ENCOUNTER — Ambulatory Visit (HOSPITAL_COMMUNITY): Payer: Self-pay | Admitting: Licensed Clinical Social Worker

## 2015-05-13 ENCOUNTER — Other Ambulatory Visit: Payer: Self-pay | Admitting: Family Medicine

## 2015-05-16 ENCOUNTER — Ambulatory Visit (HOSPITAL_COMMUNITY): Payer: Self-pay | Admitting: Licensed Clinical Social Worker

## 2015-05-24 ENCOUNTER — Ambulatory Visit (HOSPITAL_COMMUNITY): Payer: Self-pay | Admitting: Licensed Clinical Social Worker

## 2015-06-05 ENCOUNTER — Ambulatory Visit (HOSPITAL_COMMUNITY): Payer: Self-pay | Admitting: Licensed Clinical Social Worker

## 2015-06-12 ENCOUNTER — Ambulatory Visit (HOSPITAL_COMMUNITY): Payer: Self-pay | Admitting: Licensed Clinical Social Worker

## 2015-06-18 ENCOUNTER — Telehealth (HOSPITAL_COMMUNITY): Payer: Self-pay

## 2015-06-18 NOTE — Telephone Encounter (Signed)
Therapist returned phone call but there was no answer.  Left a voicemail requesting patient call back.

## 2015-06-19 ENCOUNTER — Ambulatory Visit (HOSPITAL_COMMUNITY): Payer: Self-pay | Admitting: Licensed Clinical Social Worker

## 2015-06-20 ENCOUNTER — Ambulatory Visit (HOSPITAL_COMMUNITY): Payer: Self-pay | Admitting: Psychiatry

## 2015-07-03 ENCOUNTER — Telehealth (HOSPITAL_COMMUNITY): Payer: Self-pay | Admitting: *Deleted

## 2015-07-03 NOTE — Telephone Encounter (Signed)
Pt called to make an appt. Pt states that she would like for you to give her a call. Pt states that it is important that she talks with you.  Pt is at lunch and will be at lunch until 2pm. Thanks  Pt number is # [phone]

## 2015-07-17 ENCOUNTER — Ambulatory Visit (INDEPENDENT_AMBULATORY_CARE_PROVIDER_SITE_OTHER): Payer: PRIVATE HEALTH INSURANCE | Admitting: Licensed Clinical Social Worker

## 2015-07-17 DIAGNOSIS — F4310 Post-traumatic stress disorder, unspecified: Secondary | ICD-10-CM | POA: Diagnosis not present

## 2015-07-17 DIAGNOSIS — F314 Bipolar disorder, current episode depressed, severe, without psychotic features: Secondary | ICD-10-CM | POA: Diagnosis not present

## 2015-07-17 NOTE — Progress Notes (Signed)
THERAPIST PROGRESS NOTE  Session Time: 1:00pm-2:05pm  Participation Level: Active  Behavioral Response: Casual Alert Dysphoric  Type of Therapy: Individual Therapy  Treatment Goals addressed: Coping  Interventions: Assessment and solution focused  Suicidal/Homicidal: Admitted to SI without intent or plan, Denied HI  Therapist Interventions:  Had patient fill out a PHQ-9 and GAD-7 to assess for severity of depression and anxiety.  Gathered information about a worsening of her mood and functioning since last seen in mid-August.   Discussed the need for work accommodations.  Recommended working part time for the next two months.    Summary: PHQ-9 score was 26 (severe).  Endorsed all symptoms.  Reported that she hasn't been sleeping (only 2-3 hours at night) and only eating enough to endure taking her medications.  Noted that it is hard for her to get out of bed in the morning.  Described her body as tense.  Described having a lot of trouble concentrating and sometimes zoning out.   GAD-7 score was 21.  Endorsed all symptoms.  Indicated that worries are pretty constant and repeat often.   Has had to leave work early a few times because she "feels on the verge of a break down."  Has had to take breaks thoughtout the work day to "get herself together."   Agreed that part time would be more manageable.       Plan:  Recommended weekly therapy appointments.  Will need to update treatment plan next time.   Diagnosis:Bipolar Disorder, current episode depressed, severe  PTSD    Darrin LuisSolomon, Avion Kutzer A, LCSW 07/17/2015

## 2015-07-30 ENCOUNTER — Ambulatory Visit (INDEPENDENT_AMBULATORY_CARE_PROVIDER_SITE_OTHER): Payer: PRIVATE HEALTH INSURANCE | Admitting: Licensed Clinical Social Worker

## 2015-07-30 DIAGNOSIS — F314 Bipolar disorder, current episode depressed, severe, without psychotic features: Secondary | ICD-10-CM

## 2015-07-30 DIAGNOSIS — F4310 Post-traumatic stress disorder, unspecified: Secondary | ICD-10-CM
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
## 2015-08-07 ENCOUNTER — Ambulatory Visit (INDEPENDENT_AMBULATORY_CARE_PROVIDER_SITE_OTHER): Payer: PRIVATE HEALTH INSURANCE | Admitting: Licensed Clinical Social Worker

## 2015-08-07 DIAGNOSIS — F4310 Post-traumatic stress disorder, unspecified: Secondary | ICD-10-CM | POA: Diagnosis not present

## 2015-08-07 DIAGNOSIS — F314 Bipolar disorder, current episode depressed, severe, without psychotic features: Secondary | ICD-10-CM

## 2015-08-07 NOTE — Progress Notes (Signed)
THERAPIST PROGRESS NOTE  Session Time: 1:10pm-2:10pm  Participation Level: Active  Behavioral Response: Casual Alert Euthymic  Type of Therapy: Individual Therapy  Treatment Goals addressed: Coping  Interventions: Mindfulness  Suicidal/Homicidal: Denied both  Therapist Interventions:  Taught patient a focused breathing exercise.  Explained how it can help you to calm your body and your mind.  Had her listen to a recording so that she could practice the skill.  Encouraged her to set aside at least 10 each day to practice the exercise.  Emphasized that it is good to set aside time to practice and use the skill when you recognize that your body and mind are stressed.   Summary: Cooperative about practicing the breathing exercise.  Commented on how it did have a relaxing effect on her body.  Noted that she has been practicing different relaxation techniques such as listening to soothing sounds at bedtime and reviewing different meditation exercises she learned about in [facility].  Indicated she may look into getting some recordings that will help her to stay focused on the exercises.  Reported, "This week a think I feel a little better than I did at my last appointment."           Plan:   Scheduled to return in approximately 2 weeks.  Diagnosis:Bipolar Disorder, current episode depressed, severe  PTSD    Darrin LuisSolomon, [name], LCSW 08/07/2015

## 2015-08-12 ENCOUNTER — Ambulatory Visit (HOSPITAL_COMMUNITY): Payer: PRIVATE HEALTH INSURANCE | Admitting: Psychiatry

## 2015-08-19 ENCOUNTER — Ambulatory Visit (HOSPITAL_COMMUNITY): Payer: PRIVATE HEALTH INSURANCE | Admitting: Licensed Clinical Social Worker

## 2015-08-26 ENCOUNTER — Ambulatory Visit (HOSPITAL_COMMUNITY): Payer: Self-pay | Admitting: Licensed Clinical Social Worker

## 2015-09-06 ENCOUNTER — Ambulatory Visit (HOSPITAL_COMMUNITY): Payer: PRIVATE HEALTH INSURANCE | Admitting: Licensed Clinical Social Worker

## 2015-09-29 ENCOUNTER — Other Ambulatory Visit: Payer: Self-pay | Admitting: Family Medicine

## 2015-09-30 ENCOUNTER — Other Ambulatory Visit: Payer: Self-pay | Admitting: Family Medicine

## 2015-10-10 ENCOUNTER — Ambulatory Visit (INDEPENDENT_AMBULATORY_CARE_PROVIDER_SITE_OTHER): Payer: PRIVATE HEALTH INSURANCE | Admitting: Licensed Clinical Social Worker

## 2015-10-10 DIAGNOSIS — F3132 Bipolar disorder, current episode depressed, moderate: Secondary | ICD-10-CM

## 2015-10-10 DIAGNOSIS — F4310 Post-traumatic stress disorder, unspecified: Secondary | ICD-10-CM
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
## 2015-10-14 ENCOUNTER — Ambulatory Visit: Payer: Self-pay | Admitting: Family Medicine

## 2015-10-22 ENCOUNTER — Telehealth (HOSPITAL_COMMUNITY): Payer: Self-pay | Admitting: *Deleted

## 2015-10-22 NOTE — Telephone Encounter (Signed)
Pt would like to speak with you concerning paperwork completed at visit on 10/10/15. Pt states their are some adjustments that will need to be made at her next appt on 10/24/15.

## 2015-10-23 ENCOUNTER — Ambulatory Visit (HOSPITAL_COMMUNITY): Payer: PRIVATE HEALTH INSURANCE | Admitting: Licensed Clinical Social Worker

## 2015-10-24 ENCOUNTER — Ambulatory Visit (INDEPENDENT_AMBULATORY_CARE_PROVIDER_SITE_OTHER): Payer: PRIVATE HEALTH INSURANCE | Admitting: Licensed Clinical Social Worker

## 2015-10-24 DIAGNOSIS — F3132 Bipolar disorder, current episode depressed, moderate: Secondary | ICD-10-CM | POA: Diagnosis not present

## 2015-10-24 DIAGNOSIS — F4310 Post-traumatic stress disorder, unspecified: Secondary | ICD-10-CM | POA: Diagnosis not present

## 2015-10-24 NOTE — Progress Notes (Signed)
THERAPIST PROGRESS NOTE  Session Time: 4:05pm-5:10pm  Participation Level: Active  Behavioral Response: Casual Alert Euthymic  Type of Therapy: Individual Therapy  Treatment Goals addressed: Coping  Interventions: Treatment planning  Suicidal/Homicidal: Denied both  Therapist Interventions: Discussed how patient has been taking time to think about goals for the future and take steps towards achieving those goals. Intrduced patient to a treatment method developed specifically for individuals who have a history of childhood trauma and experience PTSD symptoms.  Described the two phases of treatment.  The first phase involves skill building and the second phase involves processing traumatic events.       Summary: She is looking for a new job.  Had one interview so far.  Said she thought it went well.  Probably won't hear anything til the end of the month.  Also considering going back to school to study nursing.  Would pick where she left off at [employer].  Reported recently taking time to create a Vision Board which included goals and hopes for the future.   Indicated she is receptive to participating in the treatment method described.  Acknowledged that she needs to learn coping skills handling distressing feelings.  Indicated she is open to processing events from her past.   Reported mood has been pretty good.  Still frustrated with her job, but has not had to leave early.  Has been able to cope with frustration when it shows up.     Plan: Scheduled to return in approximately 2 weeks.  Will focusing on building emotional awareness.     Diagnosis: PTSD                                        Bipolar Disorder    Darrin Luis 10/24/2015

## 2015-10-30 ENCOUNTER — Ambulatory Visit (INDEPENDENT_AMBULATORY_CARE_PROVIDER_SITE_OTHER): Payer: 59 | Admitting: Family Medicine

## 2015-10-30 ENCOUNTER — Encounter: Payer: Self-pay | Admitting: Family Medicine

## 2015-10-30 VITALS — BP 165/93 | HR 84 | Wt 223.0 lb

## 2015-10-30 DIAGNOSIS — Z0000 Encounter for general adult medical examination without abnormal findings: Secondary | ICD-10-CM

## 2015-10-30 DIAGNOSIS — Z23 Encounter for immunization: Secondary | ICD-10-CM

## 2015-10-30 DIAGNOSIS — E119 Type 2 diabetes mellitus without complications: Secondary | ICD-10-CM | POA: Diagnosis not present

## 2015-10-30 DIAGNOSIS — Z1239 Encounter for other screening for malignant neoplasm of breast: Secondary | ICD-10-CM | POA: Diagnosis not present

## 2015-10-30 DIAGNOSIS — I10 Essential (primary) hypertension: Secondary | ICD-10-CM

## 2015-10-30 MED ORDER — METOPROLOL SUCCINATE ER 50 MG PO TB24: 50.0000 mg | ORAL_TABLET | Freq: Every day | ORAL | Status: DC

## 2015-10-30 MED ORDER — PNEUMOCOCCAL VAC POLYVALENT 25 MCG/0.5ML IJ INJ
0.5000 mL | INJECTION | Freq: Once | INTRAMUSCULAR | Status: DC
  Administered 2015-10-30: 0.5 mL via INTRAMUSCULAR

## 2015-10-30 MED ORDER — PNEUMOCOCCAL VAC POLYVALENT 25 MCG/0.5ML IJ INJ: 0.5000 mL | INJECTION | Freq: Once | INTRAMUSCULAR | Status: DC

## 2015-10-30 NOTE — Addendum Note (Signed)
Addended by: Thom Chimes on: 10/30/2015 02:25 PM   Modules accepted: Orders

## 2015-10-30 NOTE — Progress Notes (Signed)
CC: Brittany [name] is a 40 y.o. female is here for Annual Exam and Vaginitis   Subjective: HPI:  Colonoscopy: no current indication Papsmear: unfortunately we did not have time today to perform this, she is overdue, she had a number of requests regarding her essential hypertension and uncontrolled blood sugar that From this procedure.medication Mammogram:  Now due, referral placed.  Influenza Vaccine:  Pneumovax: received P-23 today Td/Tdap: declined Zoster: (Start 40 yo)  Needs alternative to [facility] or new coupoun  Requesting complete physical exam, she's been seeing her blood sugars fluctuate from high 100s through high 200. When blood sugars are around 200 or higher she gets recurrent vaginal candidiasis. She is unable to afford monthly Glyxambi and is uncertain which glipizide to be taking.  She would also like a refill on metoprolol.  Compliance is questionable given that a 120 day supply was given back in May 2016. No outside blood pressures report.  Review of Systems - General ROS: negative for - chills, fever, night sweats, weight gain or weight loss Ophthalmic ROS: negative for - decreased vision Psychological ROS: negative for - anxiety or depression ENT ROS: negative for - hearing change, nasal congestion, tinnitus or allergies Hematological and Lymphatic ROS: negative for - bleeding problems, bruising or swollen lymph nodes Breast ROS: negative Respiratory ROS: no cough, shortness of breath, or wheezing Cardiovascular ROS: no chest pain or dyspnea on exertion Gastrointestinal ROS: no abdominal pain, change in bowel habits, or black or bloody stools Genito-Urinary ROS: negative for - , genital ulcers, incontinence or abnormal bleeding from genitals Musculoskeletal ROS: negative for - joint pain or muscle pain Neurological ROS: negative for - headaches or memory loss Dermatological ROS: negative for lumps, mole changes, rash and skin lesion changes  Past Medical  History  Diagnosis Date  . Depression   . Hypertension UNSURE  . HLD (hyperlipidemia) 01/02/2014  . Diabetes mellitus without complication (HCC)   . Anxiety     Past Surgical History  Procedure Laterality Date  . Tubal ligation     Family History  Problem Relation Age of Onset  . Diabetes Mother   . Hyperlipidemia Mother   . Diabetes Father   . Asthma Son   . Diabetes Paternal Grandmother   . Diabetes Paternal Grandfather   . Kidney disease Paternal Grandfather   . Schizophrenia Maternal Grandmother     Social History   Social History  . Marital Status: Single    Spouse Name: N/A  . Number of Children: N/A  . Years of Education: N/A   Occupational History  . Not on file.   Social History Main Topics  . Smoking status: Never Smoker   . Smokeless tobacco: Never Used  . Alcohol Use: No  . Drug Use: No  . Sexual Activity: Yes    Birth Control/ Protection: None, Surgical   Other Topics Concern  . Not on file   Social History Narrative     Objective: BP 165/93 mmHg  Pulse 84  Wt 223 lb (101.152 kg)  General: No Acute Distress HEENT: Atraumatic, normocephalic, conjunctivae normal without scleral icterus.  No nasal discharge, hearing grossly intact, TMs with good landmarks bilaterally with no middle ear abnormalities, posterior pharynx clear without oral lesions. Neck: Supple, trachea midline, no cervical nor supraclavicular adenopathy. Pulmonary: Clear to auscultation bilaterally without wheezing, rhonchi, nor rales. Cardiac: Regular rate and rhythm.  No murmurs, rubs, nor gallops. No peripheral edema.  2+ peripheral pulses bilaterally. Abdomen: Bowel sounds normal.  No masses.  Non-tender without rebound.  Negative Murphy's sign. MSK: Grossly intact, no signs of weakness.  Full strength throughout upper and lower extremities.  Full ROM in upper and lower extremities.  No midline spinal tenderness. Neuro: Gait unremarkable, CN II-XII grossly intact.  C5-C6 Reflex  2/4 Bilaterally, L4 Reflex 2/4 Bilaterally.  Cerebellar function intact. Skin: No rashes. Psych: Alert and oriented to person/place/time.  Thought process normal. No anxiety/depression.   Assessment & Plan: Brittany [name] was seen today for annual exam and vaginitis.  Diagnoses and all orders for this visit:  Annual physical exam -     Hemoglobin A1c -     COMPLETE METABOLIC PANEL WITH GFR -     CBC -     Lipid panel -     TSH  Screening for breast cancer -     MM DIGITAL SCREENING BILATERAL; Future  Essential hypertension -     metoprolol succinate (TOPROL-XL) 50 MG 24 hr tablet; Take 1 tablet (50 mg total) by mouth daily. Take with or immediately following a meal. Healthy lifestyle interventions including but not limited to regular exercise, a healthy low fat diet, moderation of salt intake, the dangers of tobacco/alcohol/recreational drug use, nutrition supplementation, and accident avoidance were discussed with the patient and a handout was provided for future reference.  Change in blood sugar medication will be determined based on current savings cards, degree of A1c elevation and kidney function.  Refills of metoprolol to help with compliance.  Return in about 3 months (around 01/27/2016) for Blood Sugar Follow Up.

## 2015-10-30 NOTE — Patient Instructions (Signed)
Dr. Dexton Zwilling's General Advice Following Your Complete Physical Exam  The Benefits of Regular Exercise: Unless you suffer from an uncontrolled cardiovascular condition, studies strongly suggest that regular exercise and physical activity will add to both the quality and length of your life.  The World Health Organization recommends 150 minutes of moderate intensity aerobic activity every week.  This is best split over 3-4 days a week, and can be as simple as a brisk walk for just over 35 minutes "most days of the week".  This type of exercise has been shown to lower LDL-Cholesterol, lower average blood sugars, lower blood pressure, lower cardiovascular disease risk, improve memory, and increase one's overall sense of wellbeing.  The addition of anaerobic (or "strength training") exercises offers additional benefits including but not limited to increased metabolism, prevention of osteoporosis, and improved overall cholesterol levels.  How Can I Strive For A Low-Fat Diet?: Current guidelines recommend that 25-35 percent of your daily energy (food) intake should come from fats.  One might ask how can this be achieved without having to dissect each meal on a daily basis?  Switch to skim or 1% milk instead of whole milk.  Focus on lean meats such as ground turkey, fresh fish, baked chicken, and lean cuts of beef as your source of dietary protein.  Limit saturated fat consumption to less than 10% of your daily caloric intake.  Limit trans fatty acid consumption primarily by limiting synthetic trans fats such as partially hydrogenated oils (Ex: fried fast foods).  Substitute olive or vegetable oil for solid fats where possible.  Moderation of Salt Intake: Provided you don't carry a diagnosis of congestive heart failure nor renal failure, I recommend a daily allowance of no more than 2300 mg of salt (sodium).  Keeping under this daily goal is associated with a decreased risk of cardiovascular events, creeping  above it can lead to elevated blood pressures and increases your risk of cardiovascular events.  Milligrams (mg) of salt is listed on all nutrition labels, and your daily intake can add up faster than you think.  Most canned and frozen dinners can pack in over half your daily salt allowance in one meal.    Lifestyle Health Risks: Certain lifestyle choices carry specific health risks.  As you may already know, tobacco use has been associated with increasing one's risk of cardiovascular disease, pulmonary disease, numerous cancers, among many other issues.  What you may not know is that there are medications and nicotine replacement strategies that can more than double your chances of successfully quitting.  I would be thrilled to help manage your quitting strategy if you currently use tobacco products.  When it comes to alcohol use, I've yet to find an "ideal" daily allowance.  Provided an individual does not have a medical condition that is exacerbated by alcohol consumption, general guidelines determine "safe drinking" as no more than two standard drinks for a man or no more than one standard drink for a female per day.  However, much debate still exists on whether any amount of alcohol consumption is technically "safe".  My general advice, keep alcohol consumption to a minimum for general health promotion.  If you or others believe that alcohol, tobacco, or recreational drug use is interfering with your life, I would be happy to provide confidential counseling regarding treatment options.  General "Over The Counter" Nutrition Advice: Postmenopausal women should aim for a daily calcium intake of 1200 mg, however a significant portion of this might already be   provided by diets including milk, yogurt, cheese, and other dairy products.  Vitamin D has been shown to help preserve bone density, prevent fatigue, and has even been shown to help reduce falls in the elderly.  Ensuring a daily intake of 800 Units of  Vitamin D is a good place to start to enjoy the above benefits, we can easily check your Vitamin D level to see if you'd potentially benefit from supplementation beyond 800 Units a day.  Folic Acid intake should be of particular concern to women of childbearing age.  Daily consumption of 400-800 mcg of Folic Acid is recommended to minimize the chance of spinal cord defects in a fetus should pregnancy occur.    For many adults, accidents still remain one of the most common culprits when it comes to cause of death.  Some of the simplest but most effective preventitive habits you can adopt include regular seatbelt use, proper helmet use, securing firearms, and regularly testing your smoke and carbon monoxide detectors.  Novi Calia B. Elsie Baynes DO [facility] [address], [address] [place], [place] [address] Phone: [phone]  

## 2015-11-07 ENCOUNTER — Ambulatory Visit (HOSPITAL_COMMUNITY): Payer: PRIVATE HEALTH INSURANCE | Admitting: Licensed Clinical Social Worker

## 2015-11-08 LAB — TSH: TSH: 1.5 m[IU]/L

## 2015-11-08 LAB — COMPLETE METABOLIC PANEL WITH GFR
ALT: 42 U/L — ABNORMAL HIGH (ref 6–29)
AST: 23 U/L (ref 10–30)
Albumin: 3.8 g/dL (ref 3.6–5.1)
Alkaline Phosphatase: 73 U/L (ref 33–115)
BILIRUBIN TOTAL: 0.2 mg/dL (ref 0.2–1.2)
BUN: 12 mg/dL (ref 7–25)
CO2: 25 mmol/L (ref 20–31)
Calcium: 8.8 mg/dL (ref 8.6–10.2)
Chloride: 99 mmol/L (ref 98–110)
Creat: 0.69 mg/dL (ref 0.50–1.10)
GFR, Est African American: >89 mL/min (ref >=60)
GLUCOSE: 324 mg/dL — AB (ref 65–99)
Potassium: 4.2 mmol/L (ref 3.5–5.3)
SODIUM: 135 mmol/L (ref 135–146)
TOTAL PROTEIN: 7 g/dL (ref 6.1–8.1)

## 2015-11-08 LAB — HEMOGLOBIN A1C
Hgb A1c MFr Bld: 11.9 % — ABNORMAL HIGH (ref <5.7)
MEAN PLASMA GLUCOSE: 295 mg/dL — AB (ref <117)

## 2015-11-08 LAB — CBC
HCT: 43 % (ref 36.0–46.0)
HEMOGLOBIN: 13.3 g/dL (ref 12.0–15.0)
MCH: 23.2 pg — AB (ref 26.0–34.0)
MCHC: 30.9 g/dL (ref 30.0–36.0)
MCV: 74.9 fL — AB (ref 78.0–100.0)
MPV: 11 fL (ref 8.6–12.4)
PLATELETS: 277 10*3/uL (ref 150–400)
RBC: 5.74 MIL/uL — AB (ref 3.87–5.11)
RDW: 15.1 % (ref 11.5–15.5)
WBC: 10.1 10*3/uL (ref 4.0–10.5)

## 2015-11-08 LAB — LIPID PANEL
CHOL/HDL RATIO: 4 ratio (ref <=5.0)
Cholesterol: 194 mg/dL (ref 125–200)
HDL: 49 mg/dL (ref >=46)
LDL Cholesterol: 100 mg/dL (ref <130)
Triglycerides: 226 mg/dL — ABNORMAL HIGH (ref <150)
VLDL: 45 mg/dL — AB (ref <30)

## 2015-11-11 ENCOUNTER — Telehealth: Payer: Self-pay | Admitting: Family Medicine

## 2015-11-11 MED ORDER — EMPAGLIFLOZIN 25 MG PO TABS: 25.0000 mg | ORAL_TABLET | Freq: Every day | ORAL | Status: DC

## 2015-11-11 NOTE — Telephone Encounter (Signed)
Will you please let patient know that her A1c was 11 with a goal of less than 7.  I'd recommend she start on a new sugar medication called Jardiance to replace the expensive Glyxambi.  I've [facility] but would recommend she pick up a savings card to help knock down the price.  (in your in box). Kidney function, thyroid function, liver function and blood cell counts were normal.  Also LDL cholesterol was normal. I"d recommend f/u in 3 months.

## 2015-11-12 NOTE — Telephone Encounter (Signed)
Awaiting call back.

## 2015-11-15 NOTE — Telephone Encounter (Signed)
Second attempt

## 2015-11-21 ENCOUNTER — Ambulatory Visit (HOSPITAL_COMMUNITY): Payer: Self-pay | Admitting: Licensed Clinical Social Worker

## 2015-11-26 ENCOUNTER — Ambulatory Visit (INDEPENDENT_AMBULATORY_CARE_PROVIDER_SITE_OTHER): Payer: PRIVATE HEALTH INSURANCE | Admitting: Licensed Clinical Social Worker

## 2015-11-26 DIAGNOSIS — F4310 Post-traumatic stress disorder, unspecified: Secondary | ICD-10-CM

## 2015-11-26 DIAGNOSIS — F3132 Bipolar disorder, current episode depressed, moderate: Secondary | ICD-10-CM | POA: Diagnosis not present

## 2015-11-26 NOTE — Progress Notes (Signed)
THERAPIST PROGRESS NOTE  Session Time: 11:05am-12:00pm  Participation Level: Active  Behavioral Response: Casual Alert Depressed and Tearful  Type of Therapy: Individual Therapy  Treatment Goals addressed: Coping  Interventions: solution focused  Suicidal/Homicidal: Denied both  Therapist Interventions: Explored potential reasons for an increase in depressive symptoms in the past month.  Shared a resource with patient called the [facility] in [place].  Described the variety of services they offer.  Discussed how pursuing extra support can help her keep moving in the right direction.        Summary:  Reported work has been "unbearable."  The expectations for goals has increased to an "impossible" level.  Noted several people have been fired for not meeting the goals in the past month.  Dreads going into work.  Has continued to take breaks and practice mindfulness.  This helps sometimes.  Continues to look for another job.  Has an interview this Thursday. Also reported running into her ex.  He suggested getting back together.  She expressed to him that she didn't want to work on their relationship.  Running into him has intensified feelings of loneliness.   Indicated she is receptive to looking into services with [facility].  Said, "I think it will definitely help me obtain my goals.  I need to refocus on finding another job and going back to school."  Also noted that she needs to pursue an outlet that is just for her and give her an opportunity to connect with others.   Mentioned several times how she "doesn't want to go backwards" in her recovery.  Indicated she feels she has already started to relapse.  Hasn't been sleeping well, has lost her appetite, and she has felt an increase in anxiety.         Plan:   Scheduled to return in approximately 2 weeks.  Will focus on building emotional awareness    Diagnosis: PTSD                                      Bipolar Disorder    Darrin LuisSolomon, Turner Baillie A, LCSW 11/26/2015

## 2015-12-04 ENCOUNTER — Ambulatory Visit (INDEPENDENT_AMBULATORY_CARE_PROVIDER_SITE_OTHER): Payer: 59

## 2015-12-04 ENCOUNTER — Ambulatory Visit: Payer: Self-pay

## 2015-12-04 DIAGNOSIS — Z1231 Encounter for screening mammogram for malignant neoplasm of breast: Secondary | ICD-10-CM | POA: Diagnosis not present

## 2015-12-04 DIAGNOSIS — Z1239 Encounter for other screening for malignant neoplasm of breast: Secondary | ICD-10-CM

## 2015-12-09 ENCOUNTER — Telehealth (HOSPITAL_COMMUNITY): Payer: Self-pay | Admitting: Licensed Clinical Social Worker

## 2015-12-09 NOTE — Telephone Encounter (Signed)
Therapist returned call.  Informed patient that paperwork was completed and faxed.  Patient is going to check to make sure they got the fax.

## 2015-12-10 ENCOUNTER — Telehealth (HOSPITAL_COMMUNITY): Payer: Self-pay | Admitting: *Deleted

## 2015-12-10 NOTE — Telephone Encounter (Signed)
Return telephone call to pt. Paperwork has been completed and faxed to [phone]. Confirmed received  12/09/15 @ 2:31pm.

## 2015-12-12 ENCOUNTER — Ambulatory Visit (HOSPITAL_COMMUNITY): Payer: Self-pay | Admitting: Licensed Clinical Social Worker

## 2015-12-16 ENCOUNTER — Ambulatory Visit (HOSPITAL_COMMUNITY): Payer: Self-pay | Admitting: Licensed Clinical Social Worker

## 2015-12-16 ENCOUNTER — Telehealth (HOSPITAL_COMMUNITY): Payer: Self-pay | Admitting: Licensed Clinical Social Worker

## 2015-12-17 NOTE — Telephone Encounter (Signed)
Therapist returned call but no answer.  Left a message requesting patient call back again.

## 2015-12-23 ENCOUNTER — Telehealth (HOSPITAL_COMMUNITY): Payer: Self-pay | Admitting: Licensed Clinical Social Worker

## 2015-12-23 ENCOUNTER — Ambulatory Visit (HOSPITAL_COMMUNITY): Payer: Self-pay | Admitting: Licensed Clinical Social Worker

## 2015-12-23 NOTE — Telephone Encounter (Signed)
Therapist called to find out why patient was not present for her therapy appointment today.  Reminded patient of appointment scheduled for 01/06/16.  Informed her that if she no shows for her next appointment she will not be allowed to scheduled any more therapy appointments with this therapist.  Asked patient to return call.

## 2016-01-06 ENCOUNTER — Ambulatory Visit (INDEPENDENT_AMBULATORY_CARE_PROVIDER_SITE_OTHER): Payer: PRIVATE HEALTH INSURANCE | Admitting: Licensed Clinical Social Worker

## 2016-01-06 DIAGNOSIS — F314 Bipolar disorder, current episode depressed, severe, without psychotic features: Secondary | ICD-10-CM | POA: Diagnosis not present

## 2016-01-06 DIAGNOSIS — F411 Generalized anxiety disorder: Secondary | ICD-10-CM | POA: Diagnosis not present

## 2016-01-06 NOTE — Progress Notes (Signed)
THERAPIST PROGRESS NOTE  Session Time: 11:00am-12:05pm  Participation Level: Active  Behavioral Response: Casual Alert Depressed, Anxious, and Tearful  Type of Therapy: Individual Therapy  Treatment Goals addressed: Coping  Interventions: solution focused  Suicidal/Homicidal: Admitted to vague SI without plan or intent, denied HI Admitted to having active suicidal thoughts about a month ago.    Therapist Interventions:  Explored a worsening of mood and functioning in the past month and how patient has been coping.  Had patient complete a PHQ-9 and GAD-7 to assess for severity of symptoms.  Agreed with patient's assessment that she cannot handle the stress associated with her work environment at this time.  Discussed option of applying for short term disability. Encouraged patient to reach out to supports (brother and niece) and be honest about how she is doing rather than isolate or claim to be doing fine when she is not.   Summary: Reported that she missed her last couple of appointments because she was out of town.  She spent two weeks with her younger brother from [place].  He insisted she visit when he learned that she was severely depressed and having suicidal ideation. Indicated she feels the primary source of stress continues to be her job.  Has been going to work despite an overwhelming sense of dread, but most days has not been able to tolerate the anxiety she experiences there and ends up leaving early.  Developed a habit of biting her fingernails and pulling out her hair.      Depression screen [facility] 2/9 01/06/2016 10/10/2015 07/17/2015 04/23/2015 03/13/2015  Decreased Interest 3 1 2 1 2   Down, Depressed, Hopeless 3 2 3 2 3   PHQ - 2 Score 6 3 5 3 5   Altered sleeping 2 2 3 3 3   Tired, decreased energy 2 2 3 1 2   Change in appetite 3 0 3 1 3   Feeling bad or failure about yourself  3 1 3 3 2   Trouble concentrating 3 2 3 2 3   Moving slowly or fidgety/restless 3 0 3 1 2   Suicidal  thoughts 1 0 3 3 2   PHQ-9 Score 23 10 26 17 22   Difficult doing work/chores Extremely dIfficult - Extremely dIfficult - -   GAD 7 : Generalized Anxiety Score 01/06/2016 10/10/2015  Nervous, Anxious, on Edge 3 2  Control/stop worrying 3 2  Worry too much - different things 3 1  Trouble relaxing 3 1  Restless 2 1  Easily annoyed or irritable 3 2  Afraid - awful might happen 2 1  Total GAD 7 Score 19 10  Anxiety Difficulty Extremely difficult Very difficult    Reports she is now eligible for FMLA.  Planning to take a leave of absence.  Hoping this will allow her to devote extra energy and effort towards finding a more suitable job.  Still interested in checking out services offered at the [facility] in [place].  May also look into a resource called [employer].        Plan:   Have recommended that patient schedule two more therapy appointments before this therapist goes out on maternity leave in early May.  Diagnosis:Bipolar Disorder, current episode depressed, severe                       GAD   Darrin LuisSolomon, Rees Santistevan A, LCSW 01/06/2016

## 2016-01-09 ENCOUNTER — Telehealth (HOSPITAL_COMMUNITY): Payer: Self-pay | Admitting: Licensed Clinical Social Worker

## 2016-01-13 NOTE — Telephone Encounter (Signed)
Wants to talk to sarah in regards to papers that Maralyn SagoSarah filled out

## 2016-01-16 ENCOUNTER — Ambulatory Visit (HOSPITAL_COMMUNITY): Payer: Self-pay | Admitting: Licensed Clinical Social Worker

## 2016-01-21 ENCOUNTER — Other Ambulatory Visit: Payer: Self-pay

## 2016-01-21 MED ORDER — EMPAGLIFLOZIN 25 MG PO TABS: 25.0000 mg | ORAL_TABLET | Freq: Every day | ORAL | Status: DC

## 2016-01-22 ENCOUNTER — Encounter (HOSPITAL_COMMUNITY): Payer: Self-pay | Admitting: Psychiatry

## 2016-01-22 ENCOUNTER — Other Ambulatory Visit (HOSPITAL_COMMUNITY): Payer: PRIVATE HEALTH INSURANCE | Attending: Psychiatry | Admitting: Psychiatry

## 2016-01-22 DIAGNOSIS — F332 Major depressive disorder, recurrent severe without psychotic features: Secondary | ICD-10-CM | POA: Insufficient documentation

## 2016-01-22 DIAGNOSIS — F331 Major depressive disorder, recurrent, moderate: Secondary | ICD-10-CM

## 2016-01-22 DIAGNOSIS — I10 Essential (primary) hypertension: Secondary | ICD-10-CM | POA: Diagnosis not present

## 2016-01-22 DIAGNOSIS — E119 Type 2 diabetes mellitus without complications: Secondary | ICD-10-CM | POA: Diagnosis not present

## 2016-01-22 MED ORDER — VILAZODONE HCL 20 MG PO TABS: 20.0000 mg | ORAL_TABLET | Freq: Two times a day (BID) | ORAL | Status: DC
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
## 2016-01-23 ENCOUNTER — Other Ambulatory Visit (HOSPITAL_COMMUNITY): Payer: PRIVATE HEALTH INSURANCE | Admitting: Psychiatry

## 2016-01-23 ENCOUNTER — Ambulatory Visit (HOSPITAL_COMMUNITY): Payer: Self-pay | Admitting: Licensed Clinical Social Worker

## 2016-01-23 DIAGNOSIS — F331 Major depressive disorder, recurrent, moderate: Secondary | ICD-10-CM

## 2016-01-23 DIAGNOSIS — F332 Major depressive disorder, recurrent severe without psychotic features: Secondary | ICD-10-CM | POA: Diagnosis not present

## 2016-01-23 NOTE — Progress Notes (Signed)
    Daily Group Progress Note  Program: IOP  Group Time: 9:00-10:30  Participation Level: Active  Behavioral Response: Appropriate  Type of Therapy:  Group Therapy  Summary of Progress: Pt. Met with psychiatrist and case manager.      Group Time: 10:30-12:00  Participation Level:  Active  Behavioral Response: Appropriate  Type of Therapy: Psycho-education Group  Summary of Progress: Pt. Introduced herself to the group, appeared with flat affect, depressed. Pt. Shared that she was managing work stress and that she felt shame for returning to IOP. Pt. Described herself as a "suppressor", that it is difficult for her to process her feelings until she reaches an emotional crises and becomes depressed.   Eloise Levels, Ph.D., [facility]

## 2016-01-24 ENCOUNTER — Other Ambulatory Visit (HOSPITAL_COMMUNITY): Payer: PRIVATE HEALTH INSURANCE | Admitting: Psychiatry

## 2016-01-24 DIAGNOSIS — F331 Major depressive disorder, recurrent, moderate: Secondary | ICD-10-CM

## 2016-01-24 DIAGNOSIS — F332 Major depressive disorder, recurrent severe without psychotic features: Secondary | ICD-10-CM | POA: Diagnosis not present

## 2016-01-27 ENCOUNTER — Other Ambulatory Visit (HOSPITAL_COMMUNITY): Payer: PRIVATE HEALTH INSURANCE | Admitting: Psychiatry

## 2016-01-27 ENCOUNTER — Encounter: Payer: Self-pay | Admitting: Family Medicine

## 2016-01-27 ENCOUNTER — Ambulatory Visit (INDEPENDENT_AMBULATORY_CARE_PROVIDER_SITE_OTHER): Payer: 59 | Admitting: Family Medicine

## 2016-01-27 ENCOUNTER — Telehealth (HOSPITAL_COMMUNITY): Payer: Self-pay | Admitting: Psychiatry

## 2016-01-27 VITALS — BP 133/85 | HR 85 | Wt 218.0 lb

## 2016-01-27 DIAGNOSIS — K111 Hypertrophy of salivary gland: Secondary | ICD-10-CM | POA: Diagnosis not present

## 2016-01-27 DIAGNOSIS — I10 Essential (primary) hypertension: Secondary | ICD-10-CM | POA: Diagnosis not present

## 2016-01-27 DIAGNOSIS — E119 Type 2 diabetes mellitus without complications: Secondary | ICD-10-CM | POA: Diagnosis not present

## 2016-01-27 DIAGNOSIS — R600 Localized edema: Secondary | ICD-10-CM

## 2016-01-27 DIAGNOSIS — R609 Edema, unspecified: Secondary | ICD-10-CM

## 2016-01-27 LAB — POCT GLYCOSYLATED HEMOGLOBIN (HGB A1C): HEMOGLOBIN A1C: 11.2

## 2016-01-27 MED ORDER — GLIPIZIDE ER 10 MG PO TB24: 10.0000 mg | ORAL_TABLET | Freq: Every day | ORAL | Status: DC

## 2016-01-27 MED ORDER — FLUCONAZOLE 150 MG PO TABS: ORAL_TABLET | ORAL | Status: AC

## 2016-01-27 MED ORDER — AMOXICILLIN-POT CLAVULANATE 500-125 MG PO TABS: ORAL_TABLET | ORAL | Status: AC

## 2016-01-27 NOTE — Progress Notes (Signed)
    Daily Group Progress Note  Program: IOP  Group Time: 9:00-12:00  Participation Level: Active  Behavioral Response: Appropriate  Type of Therapy:  Group Therapy/Psychoeducation  Summary of Progress:  Pt. Presented with primarily flat affect, but talkative and engaged in the group process. Pt. Discussed work stress in call center, not eating or sleeping well and general loss of routine that she attributes to depression. Watched and discussed Geneen Roth's "Food, God, Love" and discussed patterns of numbing with destructive behaviors, resisting tendency for shaming,and using moments of relapse to recognize deeper emotional needs such as sadness, anger and loneliness.       Shaune PollackBrown, Maebry Obrien B, LPC

## 2016-01-27 NOTE — Progress Notes (Signed)
CC: Brittany BanningLakisha [name] is a 40 y.o. female is here for Hyperglycemia   Subjective: HPI:  Follow-up essential hypertension: Taking metoprolol on a daily basis with no outside blood pressures report. Denies chest pain shortness of breath orthopnea nor peripheral edema.  Follow-up type 2 diabetes: She is taking jardiance a daily basis with no known side effects. No outside blood sugars to report. She's bothered by polyuria but without any other genitourinary complaints and no dysuria. She denies vision disturbance, polyphagia or polydipsia.  She's had some swelling in the left cheek for the past 2 days. It slightly red and tender to touch. Worse with opening and closing the jaw or when salivating. Denies fevers or chills or ear pain   Review Of Systems Outlined In HPI  Past Medical History  Diagnosis Date  . Depression   . Hypertension UNSURE  . HLD (hyperlipidemia) 01/02/2014  . Diabetes mellitus without complication (HCC)   . Anxiety     Past Surgical History  Procedure Laterality Date  . Tubal ligation     Family History  Problem Relation Age of Onset  . Diabetes Mother   . Hyperlipidemia Mother   . Diabetes Father   . Asthma Son   . Diabetes Paternal Grandmother   . Diabetes Paternal Grandfather   . Kidney disease Paternal Grandfather   . Schizophrenia Maternal Grandmother     Social History   Social History  . Marital Status: Single    Spouse Name: N/A  . Number of Children: N/A  . Years of Education: N/A   Occupational History  . Not on file.   Social History Main Topics  . Smoking status: Never Smoker   . Smokeless tobacco: Never Used  . Alcohol Use: No  . Drug Use: No  . Sexual Activity: Not Currently    Birth Control/ Protection: None, Surgical   Other Topics Concern  . Not on file   Social History Narrative     Objective: BP 133/85 mmHg  Pulse 85  Wt 218 lb (98.884 kg)  General: Alert and Oriented, No Acute Distress HEENT: Pupils equal, round,  reactive to light. Conjunctivae clear.  External ears unremarkable, canals clear with intact TMs with appropriate landmarks.  Middle ear appears open without effusion. Pink inferior turbinates.  Moist mucous membranes, pharynx without inflammation nor lesions.  With no palpable abnormality however trace swelling over the left parotid gland which is tender to touch Lungs: Clear to auscultation bilaterally, no wheezing/ronchi/rales.  Comfortable work of breathing. Good air movement. Cardiac: Regular rate and rhythm. Normal S1/S2.  No murmurs, rubs, nor gallops.   Extremities: No peripheral edema.  Strong peripheral pulses.  Mental Status: No depression, anxiety, nor agitation. Skin: Warm and dry.  Assessment & Plan: Brittany [name] was seen today for hyperglycemia.  Diagnoses and all orders for this visit:  Essential hypertension  Type 2 diabetes mellitus without complication, without long-term current use of insulin (HCC) -     POCT HgB A1C  Salivary gland swelling  Other orders -     fluconazole (DIFLUCAN) 150 MG tablet; Take one tab, may take second tab if no improvement after 72 hours. -     amoxicillin-clavulanate (AUGMENTIN) 500-125 MG tablet; Take one by mouth every 8 hours for ten total days. -     glipiZIDE (GLUCOTROL XL) 10 MG 24 hr tablet; Take 1 tablet (10 mg total) by mouth daily with breakfast.   Type 2 diabetes: Uncontrolled chronic condition with an A1c of 11.2,  adding  glipizide, she was not taking the 5 mg formulation prescribed at prior visit. Essential hypertension: Controlled with metoprolol Salivary gland swelling: Most likely due to mild parotid gland infection therefore trial of Augmentin  Return in about 3 months (around 04/28/2016) for Sugar.

## 2016-01-28 ENCOUNTER — Encounter (HOSPITAL_COMMUNITY): Payer: Self-pay | Admitting: Licensed Clinical Social Worker

## 2016-01-28 ENCOUNTER — Other Ambulatory Visit (HOSPITAL_COMMUNITY): Payer: PRIVATE HEALTH INSURANCE | Admitting: Psychiatry

## 2016-01-28 DIAGNOSIS — F332 Major depressive disorder, recurrent severe without psychotic features: Secondary | ICD-10-CM | POA: Diagnosis not present

## 2016-01-28 DIAGNOSIS — F331 Major depressive disorder, recurrent, moderate: Secondary | ICD-10-CM

## 2016-01-28 NOTE — Progress Notes (Signed)
    Daily Group Progress Note  Program: IOP  Group Time: 9:00-12:00  Participation Level: Active  Behavioral Response: Appropriate  Type of Therapy:  Group Therapy/Psychoeducation  Summary of Progress:  Pt. Presented as talkative, engaged in group process. Pt. Discussed work stress and feeling that she has inadequate social support. Pt. Discussed history of family problems and poor communication and secrets within her immediate and extended family. Participated in guided reflection and discussion about setting boundaries in family, managing the social stigma of mental illness, and finding comfort in animals.       Shaune PollackBrown, Breydon Senters B, LPC

## 2016-01-28 NOTE — Progress Notes (Signed)
    Daily Group Progress Note  Program: IOP  Group Time: 9:00-11:00  Participation Level: Active  Behavioral Response: Appropriate  Type of Therapy:  Group Therapy  Summary of Progress: Pt. Presented as engaged in the group process, shared that she is feeling more depressed. Pt. Attributed feeling of worsening of depression to problems with the medication. Pt. Discussed her relationship history and problems with trust. Pt discussed not feeling that she could trust her caregivers as a young child and how this has carried into her adult life. Pt. Shared her desire to connect socially, but that she has anxiety in social situations due to her fears of trusting others.       Shaune PollackBrown, Jennifer B, LPC

## 2016-01-28 NOTE — Progress Notes (Signed)
    Daily Group Progress Note  Program: IOP  Group Time: 11-12  Participation Level: Minimal  Behavioral Response: Appropriate  Type of Therapy:  Psycho-education Group  Summary of Progress: Pt participated in a group discussion and activity on mindfulness.     Symphanie Cederberg S, Licensed Cli

## 2016-01-29 ENCOUNTER — Other Ambulatory Visit (HOSPITAL_COMMUNITY): Payer: PRIVATE HEALTH INSURANCE | Admitting: Psychiatry

## 2016-01-29 DIAGNOSIS — F332 Major depressive disorder, recurrent severe without psychotic features: Secondary | ICD-10-CM | POA: Diagnosis not present

## 2016-01-29 DIAGNOSIS — F331 Major depressive disorder, recurrent, moderate: Secondary | ICD-10-CM

## 2016-01-29 NOTE — Progress Notes (Signed)
    Daily Group Progress Note  Program: IOP  Group Time: 9-12:00  Participation Level: Active  Behavioral Response: Appropriate  Type of Therapy:  Group Therapy  Summary of Progress: Pt. Reported "I am feeling better today". Pt. Was engaged in the group process, talkative, and reflective. Pt. Shared that she was using penzu journaling application to help with self-awareness. Pt. Shared that she had been stressed about her short-term disability which she received resolution about yesterday.      Shaune PollackBrown, Deryl Ports B, LPC

## 2016-01-30 ENCOUNTER — Other Ambulatory Visit (HOSPITAL_COMMUNITY): Payer: PRIVATE HEALTH INSURANCE | Admitting: Psychiatry

## 2016-01-30 DIAGNOSIS — F331 Major depressive disorder, recurrent, moderate: Secondary | ICD-10-CM

## 2016-01-30 DIAGNOSIS — F332 Major depressive disorder, recurrent severe without psychotic features: Secondary | ICD-10-CM | POA: Diagnosis not present

## 2016-01-31 ENCOUNTER — Other Ambulatory Visit (HOSPITAL_COMMUNITY): Payer: PRIVATE HEALTH INSURANCE | Admitting: Psychiatry

## 2016-01-31 NOTE — Progress Notes (Signed)
    Daily Group Progress Note  Program: IOP  Group Time: 9:00-10:30  Participation Level: Active  Behavioral Response: Appropriate  Type of Therapy:  Group Therapy  Summary of Progress: Pt. Presented as quiet and did not share in the first half of group. Pt. Left group at the break due to a previously scheduled appointment.      Shaune PollackBrown, Trudi Morgenthaler B, LPC

## 2016-02-03 ENCOUNTER — Other Ambulatory Visit (HOSPITAL_COMMUNITY): Payer: PRIVATE HEALTH INSURANCE | Admitting: Licensed Clinical Social Worker

## 2016-02-03 ENCOUNTER — Telehealth (HOSPITAL_COMMUNITY): Payer: Self-pay | Admitting: Licensed Clinical Social Worker

## 2016-02-03 DIAGNOSIS — F331 Major depressive disorder, recurrent, moderate: Secondary | ICD-10-CM

## 2016-02-03 DIAGNOSIS — F332 Major depressive disorder, recurrent severe without psychotic features: Secondary | ICD-10-CM | POA: Diagnosis not present

## 2016-02-03 NOTE — Progress Notes (Signed)
Patient ID: Brittany [name], female   DOB: 02/16/1976, 40 y.o.   MRN: [id number]    IOP Group Progress Note     Group Time: 9-12   Participation Level: nomral   Behavioral Response: appropriate   Type of Therapy:  Pscyhoeducation/Group Therapy   Summary of Progress: First part of group was facilitated by [facility] on medication management. Pt was part of a discussion on using coping skills to deal with her stressors. Pt was able to identify her stressors.    Ottis StainLisbeth Sahithi [name], Therapist

## 2016-02-04 ENCOUNTER — Other Ambulatory Visit (HOSPITAL_COMMUNITY): Payer: PRIVATE HEALTH INSURANCE | Admitting: Psychiatry

## 2016-02-04 ENCOUNTER — Ambulatory Visit (HOSPITAL_COMMUNITY): Payer: Self-pay | Admitting: Licensed Clinical Social Worker

## 2016-02-04 DIAGNOSIS — F332 Major depressive disorder, recurrent severe without psychotic features: Secondary | ICD-10-CM | POA: Diagnosis not present

## 2016-02-04 NOTE — Progress Notes (Signed)
IOP Group Progress Note Group Time: 10:45-12:00  Participation Level: Active  Behavioral Response: Appropriate  Type of Therapy:  Psychoeducation  Summary of Progress: Pt. Participated in mindfulness activity - "The Raisin," Pt participated in a discussion about the importance in using mindfulness as a coping skill for depression and anxiety. Pt joined in the discussion sharing how the mindfulness activity could be used any time of the day as a coping skill to being more present and focused.           Emiah Pellicano S, Licensed Cli 

## 2016-02-04 NOTE — Progress Notes (Signed)
Brittany BanningLakisha [name] is a 40 y.o., divorced, employed, [place] female, who was referred per therapist; treatment for worsening depressive and anxiety symptoms with SI.  Pt has been attending MH-IOP since 01-22-16.  Pt will continue in MH-IOP through 02-12-16.  A:  Will follow up with Dr. Jannifer FranklinAkintayo and therapist.  R:  Pt receptive.        Chestine SporeLARK, RITA, M.Ed, CNA

## 2016-02-04 NOTE — Progress Notes (Signed)
    Daily Group Progress Note  Program: IOP  Group Time: 9:00-10:30  Participation Level: Active  Behavioral Response: Appropriate  Type of Therapy:  Group Therapy  Summary of Progress: Pt. Presented with brightened affect. Pt. Was talkative, engaged in the group process. Pt. Reported that she has been feeling better, got a lip piercing which is symbolic for her of living for herself on her own terms and not trying so hard to please others.     Boneta LucksJennifer Surabhi Gadea, Ph.D., [facility]

## 2016-02-05 ENCOUNTER — Other Ambulatory Visit (HOSPITAL_COMMUNITY): Payer: PRIVATE HEALTH INSURANCE | Admitting: Psychiatry

## 2016-02-06 ENCOUNTER — Other Ambulatory Visit (HOSPITAL_COMMUNITY): Payer: PRIVATE HEALTH INSURANCE | Admitting: Psychiatry

## 2016-02-07 ENCOUNTER — Other Ambulatory Visit (HOSPITAL_COMMUNITY): Payer: PRIVATE HEALTH INSURANCE | Admitting: Psychiatry

## 2016-02-07 DIAGNOSIS — F331 Major depressive disorder, recurrent, moderate: Secondary | ICD-10-CM

## 2016-02-07 DIAGNOSIS — F332 Major depressive disorder, recurrent severe without psychotic features: Secondary | ICD-10-CM | POA: Diagnosis not present

## 2016-02-10 ENCOUNTER — Other Ambulatory Visit (HOSPITAL_COMMUNITY): Payer: PRIVATE HEALTH INSURANCE | Admitting: Licensed Clinical Social Worker

## 2016-02-10 DIAGNOSIS — F332 Major depressive disorder, recurrent severe without psychotic features: Secondary | ICD-10-CM | POA: Diagnosis not present

## 2016-02-10 NOTE — Progress Notes (Signed)
IOP Group Progress Note Group Time: 9-10:45  Participation Level: Minimal  Behavioral Response: Appropriate  Type of Therapy:  Psychoeducation  Summary of Progress: Pt participated in a group [facility] on medication.   Group Time: 10:45-12pm  Participating Level: Minimal  Behavior Response: Appropriate  Type of Therapy: Group Therapy  Summary of Progress: Pt. Presents with primarily flat affect, responsive to group questions and process.Pt reported she was active over the weekend, cooking and cleaning house. Pt partcipated in a discussion about the importance of having fun while new in recovery. This is part of whole-person recovery.            Dajour Pierpoint S, Licensed Cli

## 2016-02-11 ENCOUNTER — Ambulatory Visit (HOSPITAL_COMMUNITY): Payer: Self-pay | Admitting: Licensed Clinical Social Worker

## 2016-02-11 ENCOUNTER — Other Ambulatory Visit (HOSPITAL_COMMUNITY): Payer: PRIVATE HEALTH INSURANCE | Admitting: Psychiatry

## 2016-02-11 DIAGNOSIS — F314 Bipolar disorder, current episode depressed, severe, without psychotic features: Secondary | ICD-10-CM

## 2016-02-11 DIAGNOSIS — F331 Major depressive disorder, recurrent, moderate: Secondary | ICD-10-CM

## 2016-02-11 DIAGNOSIS — F411 Generalized anxiety disorder: Secondary | ICD-10-CM

## 2016-02-11 DIAGNOSIS — F332 Major depressive disorder, recurrent severe without psychotic features: Secondary | ICD-10-CM | POA: Diagnosis not present

## 2016-02-11 NOTE — Progress Notes (Signed)
Roanna BanningLakisha Bellevue is a 40 y.o. , divorce, employed, [place] female, who was referred per therapist Rolly Salter(Sara [name], [facility]); treatment for worsening depressive and anxiety symptoms with SI (no plan or intent). Discussed safety options with pt at length; pt is able to contract for safety. Pt is well known to this Clinical research associatewriter. Pt was in MH-IOP on 01-23-15 for ~ two to three weeks due to depression. Stressor/Triggers: Job ([employer]) of six yrs. According to pt, the matrix has been increased to an unrealistic quota. "People are hitting the quota, but not in a honest way." Pt states she is exploring other job options, but has been unsuccessful, but feels stuck due to good pay and benefits. 2) Unresolved grief/loss issues: Pt is still grieving the loss of her Grandmother, who died when pt was age 89. Pt found out in 2003, that she wasn't a Agricultural consultantbiological Grandmother. Relationship with boyfriend ended in April 2017. He has moved everything out of the condo. 3) Conflictual relationship with father.    Patients Currently Reported Symptoms/Problems: tearful, sadness, anxious, decreased sleep and appetite (sleeps 2-3 hrs per nite and has lost 12 lbs within three weeks), decreased concentration, irritability, isolative, anhedonia, ruminating thoughts. Collateral Involvement: Pt has support of brother in [place] Individual's Strengths: pt is motivated for treatment Pt has been very active in all the groups.  Pt will remain in MH-IOP thru the beginning of next week; possible discharge on 02-18-16.  A:  F/U with Dr. Jannifer FranklinAkintayo and therapist.  R:  Pt receptive.        Chestine SporeLARK, RITA, M.Ed, CNA
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
## 2016-02-11 NOTE — Progress Notes (Signed)
    Daily Group Progress Note  Program: IOP  Group Time: 10:45-12:00  Participation Level: Active  Behavioral Response: Appropriate  Type of Therapy:  Psycho-education Group  Summary of Progress: Pt participated in a discussion on "What does my depression personally look like." Pt participated in activity on identifying personal depression symptoms. Pt reported she puts on a mask daily so that no one sees how she is really feeling.       Brittany [name] S, Licensed Cli

## 2016-02-12 ENCOUNTER — Other Ambulatory Visit (HOSPITAL_COMMUNITY): Payer: PRIVATE HEALTH INSURANCE

## 2016-02-12 NOTE — Progress Notes (Signed)
    Daily Group Progress Note  Program: IOP  Group Time: 9:00-10:30  Participation Level: Active  Behavioral Response: Appropriate  Type of Therapy:  Group Therapy  Summary of Progress: Pt. Presented with slightly brightened affect, talkative, engaged in group. Pt. Shared that she was feeling "ok". Pt. Shared that she was seeing a new psychiatrist who prescribed new medication and she was able to sleep for 6 hours last night for the first time in months. Pt. Attributed feeling better to being able to sleep.    Boneta LucksJennifer Naveen Lorusso, Ph.D., [facility]

## 2016-02-13 ENCOUNTER — Other Ambulatory Visit (HOSPITAL_COMMUNITY): Payer: PRIVATE HEALTH INSURANCE | Admitting: Psychiatry

## 2016-02-13 DIAGNOSIS — F314 Bipolar disorder, current episode depressed, severe, without psychotic features: Secondary | ICD-10-CM

## 2016-02-13 DIAGNOSIS — F332 Major depressive disorder, recurrent severe without psychotic features: Secondary | ICD-10-CM | POA: Diagnosis not present

## 2016-02-14 ENCOUNTER — Other Ambulatory Visit (HOSPITAL_COMMUNITY): Payer: PRIVATE HEALTH INSURANCE | Admitting: Psychiatry

## 2016-02-14 NOTE — Progress Notes (Signed)
    Daily Group Progress Note  Program: IOP  Group Time: 9:00-12:00  Participation Level: Active  Behavioral Response: Appropriate  Type of Therapy:  Group Therapy   Summary of Progress: Client discussed she had to grow up "too quickly" and care for her siblings due to her mother divorcing her father.  Client shared she was fearful of leaving her own ex-husband because she did not want her children to experience divorce as she had.  Client was encouraging to others in the group by sharing her experience and how glad she is that she got out of the bad situation, despite her fears at the time.    Shaune PollackBrown, Gregori Abril B, LPC

## 2016-02-18 ENCOUNTER — Other Ambulatory Visit (HOSPITAL_COMMUNITY): Payer: PRIVATE HEALTH INSURANCE | Admitting: Psychiatry

## 2016-02-18 NOTE — Progress Notes (Signed)
Roanna BanningLakisha Ace is a 40 y.o. female patient who has been attending MH-IOP since 01-22-16.  Patient continues to attend and participate in MH-IOP.  States she continues to struggle with ongoing depressive and anxiety symptoms.  Admits to improved sleep.  Denies SI/HI or A/V hallucinations.  A:  Pt excused today due to appointment with psychiatrist (Dr. Jannifer FranklinAkintayo).  Pt will be discharged from MH-IOP tomorrow as scheduled.       Chestine SporeLARK, RITA, M.Ed, CNA

## 2016-02-19 ENCOUNTER — Other Ambulatory Visit (HOSPITAL_COMMUNITY): Payer: PRIVATE HEALTH INSURANCE | Admitting: Psychiatry

## 2016-02-19 DIAGNOSIS — F411 Generalized anxiety disorder: Secondary | ICD-10-CM

## 2016-02-19 DIAGNOSIS — F331 Major depressive disorder, recurrent, moderate: Secondary | ICD-10-CM

## 2016-02-19 DIAGNOSIS — F314 Bipolar disorder, current episode depressed, severe, without psychotic features: Secondary | ICD-10-CM

## 2016-02-19 DIAGNOSIS — F332 Major depressive disorder, recurrent severe without psychotic features: Secondary | ICD-10-CM | POA: Diagnosis not present

## 2016-02-19 NOTE — Patient Instructions (Signed)
Pt completed MH-IOP today.  Will follow up with Caryn SectionAlyssa Brandt, LCSW on 02-26-16 @ 9 am and Leone Payorrystal Montague, NP on 03-05-16 @ 10 am.  Encouraged support groups.

## 2016-02-19 NOTE — Progress Notes (Signed)
Patient ID: Brittany [name], female   DOB: 11/28/1975, 40 y.o.   MRN: [id number] Discharge Note  Patient:  Brittany [name] is an 40 y.o., female DOB:  08/13/1976  Date of Admission:  01/22/2016  Date of Discharge:  02/19/2016  Reason for Admission:depression  IOP Course;  [name] is not much changed since admission.  She attended and participated.  She says she realizes she is going to have to change her job as that is the major stress and is ruining her mental health.  She still feels unable to return to work and estimates it will be at least 2 weeks, the change to sertraline has not kicked in yet  So her anxiety also remains high,  Her son will be home from college and that will probably help some she believes.  Mental Status at Discharge:still depressed and anxious.  Feels that life is not worth living almost daily but no suicidal thoughts, plan or intent.  Lab Results: No results found for this or any previous visit (from the past 48 hour(s)).   Current outpatient prescriptions:  .  empagliflozin (JARDIANCE) 25 MG TABS tablet, Take 25 mg by mouth daily., Disp: 90 tablet, Rfl: 2 .  glipiZIDE (GLUCOTROL XL) 10 MG 24 hr tablet, Take 1 tablet (10 mg total) by mouth daily with breakfast., Disp: 90 tablet, Rfl: 1 .  lamoTRIgine (LAMICTAL) 100 MG tablet, Take 1 tablet (100 mg total) by mouth daily., Disp: 30 tablet, Rfl: 1 .  Lancets (ONETOUCH ULTRASOFT) lancets, Use as instructed, Disp: 100 each, Rfl: 12 .  metoprolol succinate (TOPROL-XL) 50 MG 24 hr tablet, Take 1 tablet (50 mg total) by mouth daily. Take with or immediately following a meal., Disp: 90 tablet, Rfl: 1 .  traZODone (DESYREL) 100 MG tablet, Take 0.5-1 tablets (50-100 mg total) by mouth at bedtime as needed for sleep., Disp: 30 tablet, Rfl: 0 .  Vilazodone HCl (VIIBRYD) 40 MG TABS, Take 1 tablet (40 mg total) by mouth daily., Disp: 90 tablet, Rfl: 1 .  Vilazodone HCl 20 MG TABS, Take 1 tablet (20 mg total) by mouth 2 (two)  times daily., Disp: 60 tablet, Rfl: 0  Axis Diagnosis:  Major depression, recurrent severe without psychosis   Level of Care:  IOP  Discharge destination:  Other:  has appointments with psychiatrist and therapist  Is patient on multiple antipsychotic therapies at discharge:  No    Has Patient had three or more failed trials of antipsychotic monotherapy by history:  Negative  Patient phone:  [phone] (home)  Patient address:   [address] [place] [address],   Follow-up recommendations:  Activity:  continue current activity  Diet:  continue current diet  Comments:  Needs to find a new job  The patient received suicide prevention pamphlet:  Yes   Carolanne GrumblingGerald Tejon Gracie 02/19/2016, 11:30 AM

## 2016-02-19 NOTE — Progress Notes (Signed)
Roanna BanningLakisha Hayduk is a 40 y.o. , divorce, employed, [place] female, who was referred per therapist Rolly Salter(Sara [name], [facility]); treatment for worsening depressive and anxiety symptoms with SI (no plan or intent). Discussed safety options with pt at length; pt was able to contract for safety. Pt is well known to this Clinical research associatewriter. Pt was in MH-IOP on 01-23-15 for ~ two to three weeks due to depression. CC: previous admit note for in depth history. Stressor/Triggers: Job ([employer]) of six yrs. According to pt, the matrix has been increased to an unrealistic quota. "People are hitting the quota, but not in a honest way." Pt stated she is exploring other job options, but has been unsuccessful, but feels stuck due to good pay and benefits. 2) Unresolved grief/loss issues: Pt is still grieving the loss of her Grandmother, who died when pt was age 40. Pt found out in 2003, that she wasn't a Agricultural consultantbiological Grandmother. Relationship with boyfriend ended in April 2017. He has moved everything out of the condo. 3) Conflictual relationship with father.  Pt was discharged from MH-IOP today.  Continues to struggle with poor appetite, decreased sleep (4 hrs), excessive worrying, ruminating thoughts, feelings of hopelessness and helplessness.  Admits to vague SI, no plan or intent.  Able to contract for safety.  Denies HI or A/V hallucinations.  States youngest son will be visiting with her once he completes school for the summer.  "This will force me to get out of the house."   Pt has decided to attend ACOA groups.  A:  D/C today.  F/U with Caryn SectionAlyssa Brandt, LCSW on 02-26-16 @ 9 am and Leone Payorrystal Montague, NP on 03-05-16 @ 10 a.m.  Encouraged support groups.  Leone Payorrystal Montague, NP will give pt RTW date.  R:  Pt receptive.   Chestine SporeLARK, RITA, M.Ed, CNA

## 2016-02-19 NOTE — Progress Notes (Signed)
    Daily Group Progress Note  Program: IOP  Group Time: 10:45-12:00  Participation Level: Active  Behavioral Response: Appropriate  Type of Therapy:  Group Therapy  Summary of Progress: Pt completes the program today. She is ambivilent about being discharged. Her psychiatrist started her on a new medication and she doesn't think its taken effect yet. She participated in a discussion about strategies to deal with her anxiety and depression.       Soloman Mckeithan S, Licensed Cli

## 2016-02-20 ENCOUNTER — Other Ambulatory Visit (HOSPITAL_COMMUNITY): Payer: PRIVATE HEALTH INSURANCE

## 2016-02-20 NOTE — Progress Notes (Signed)
    Daily Group Progress Note  Program: IOP  Group Time: 9:00-10:30  Participation Level: Active  Behavioral Response: Appropriate  Type of Therapy:  Group Therapy  Summary of Progress: Pt. discharged today. Client asked therapist about the time frame of medicine being effective. Therapist encouraged her to take her medicine consistently, and to give the medication 4-6 weeks to get in her system. 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 Therapist identified that work was a trigger for her, and helped her to think about an exit strategy. Client stated that she regretted not finishing school. Therapist explored forgiveness with the client, and to forgive herself for not completing school at this point. Therapist also helped the client to think about possible job openings in her field, and to educate her about help available to her in the community.     Shaune PollackBrown, Jennifer B, LPC

## 2016-02-21 ENCOUNTER — Other Ambulatory Visit (HOSPITAL_COMMUNITY): Payer: PRIVATE HEALTH INSURANCE

## 2016-02-24 ENCOUNTER — Other Ambulatory Visit (HOSPITAL_COMMUNITY): Payer: PRIVATE HEALTH INSURANCE

## 2016-02-25 ENCOUNTER — Other Ambulatory Visit (HOSPITAL_COMMUNITY): Payer: PRIVATE HEALTH INSURANCE

## 2016-02-26 ENCOUNTER — Other Ambulatory Visit (HOSPITAL_COMMUNITY): Payer: PRIVATE HEALTH INSURANCE

## 2016-02-27 ENCOUNTER — Other Ambulatory Visit (HOSPITAL_COMMUNITY): Payer: PRIVATE HEALTH INSURANCE

## 2016-02-28 ENCOUNTER — Other Ambulatory Visit (HOSPITAL_COMMUNITY): Payer: PRIVATE HEALTH INSURANCE

## 2016-03-02 ENCOUNTER — Other Ambulatory Visit (HOSPITAL_COMMUNITY): Payer: PRIVATE HEALTH INSURANCE

## 2016-03-03 ENCOUNTER — Other Ambulatory Visit (HOSPITAL_COMMUNITY): Payer: PRIVATE HEALTH INSURANCE

## 2016-03-04 ENCOUNTER — Other Ambulatory Visit (HOSPITAL_COMMUNITY): Payer: PRIVATE HEALTH INSURANCE

## 2016-03-05 ENCOUNTER — Other Ambulatory Visit (HOSPITAL_COMMUNITY): Payer: PRIVATE HEALTH INSURANCE

## 2016-03-06 ENCOUNTER — Other Ambulatory Visit (HOSPITAL_COMMUNITY): Payer: PRIVATE HEALTH INSURANCE

## 2016-03-09 ENCOUNTER — Other Ambulatory Visit (HOSPITAL_COMMUNITY): Payer: PRIVATE HEALTH INSURANCE

## 2016-03-10 ENCOUNTER — Other Ambulatory Visit (HOSPITAL_COMMUNITY): Payer: PRIVATE HEALTH INSURANCE

## 2016-03-11 ENCOUNTER — Other Ambulatory Visit (HOSPITAL_COMMUNITY): Payer: PRIVATE HEALTH INSURANCE

## 2016-03-12 ENCOUNTER — Other Ambulatory Visit (HOSPITAL_COMMUNITY): Payer: PRIVATE HEALTH INSURANCE

## 2016-03-13 ENCOUNTER — Other Ambulatory Visit (HOSPITAL_COMMUNITY): Payer: PRIVATE HEALTH INSURANCE

## 2016-03-16 ENCOUNTER — Other Ambulatory Visit (HOSPITAL_COMMUNITY): Payer: PRIVATE HEALTH INSURANCE

## 2016-03-17 ENCOUNTER — Other Ambulatory Visit (HOSPITAL_COMMUNITY): Payer: PRIVATE HEALTH INSURANCE

## 2016-03-18 ENCOUNTER — Other Ambulatory Visit (HOSPITAL_COMMUNITY): Payer: PRIVATE HEALTH INSURANCE

## 2016-04-06 ENCOUNTER — Ambulatory Visit (INDEPENDENT_AMBULATORY_CARE_PROVIDER_SITE_OTHER): Payer: PRIVATE HEALTH INSURANCE | Admitting: Licensed Clinical Social Worker

## 2016-04-06 DIAGNOSIS — F3132 Bipolar disorder, current episode depressed, moderate: Secondary | ICD-10-CM

## 2016-04-06 DIAGNOSIS — F411 Generalized anxiety disorder: Secondary | ICD-10-CM

## 2016-04-06 NOTE — Progress Notes (Signed)
THERAPIST PROGRESS NOTE  Session Time: 3:20pm- 4:15pm  Participation Level: Active  Behavioral Response: Casual Alert Euthymic  Type of Therapy: Individual Therapy  Treatment Goals addressed: Coping  Interventions: assessment and treatment planning  Suicidal/Homicidal: Denied both     Therapist Interventions:   Gathered information about significant events and changes in mood and functioning since last seen in mid-April. Had patient complete a PHQ-9 and GAD-7 to assess for severity of depression and anxiety symptoms.   Discussed her experience in the [place] and how it differed from the first time she participated in the program. Discussed how she has been working on not isolating so much from others.   Assessed motivation for finding a new job.    Summary: Reported she was out of work for a period of about 2 months so she could focus on mood stabilization.  During that time she participated in [place].  She had done the program once before approximately one year ago.  She reported getting more out of it this time around because she felt safe enough to actually participate rather than just be an observer.   Reported she has started seeing a psychiatrist at the [facility] in [place].  Her medications were changed.  Now taking Lamictal, Zoloft, and trazodone.  Expressed a belief that the change in medication has contributed to improvement.  She said, " Once I gave it time I started to see a difference in my ability to function and cope with my daily stressors."  Both the PHQ-9 and GAD-7 indicated a significant improvement in mood and functioning.     Depression screen [facility] 2/9 04/06/2016 01/22/2016 01/06/2016 10/10/2015 07/17/2015  Decreased Interest '1 3 3 1 2  ' Down, Depressed, Hopeless '1 3 3 2 3  ' PHQ - 2 Score '2 6 6 3 5  ' Altered sleeping '2 3 2 2 3  ' Tired, decreased energy '2 3 2 2 3  ' Change in appetite '2 3 3 ' 0 3  Feeling bad or failure about yourself  '1 3  3 1 3  ' Trouble concentrating '1 3 3 2 3  ' Moving slowly or fidgety/restless 0 3 3 0 3  Suicidal thoughts 0 3 1 0 3  PHQ-9 Score '10 27 23 10 26  ' Difficult doing work/chores Very difficult Extremely dIfficult Extremely dIfficult - Extremely dIfficult   GAD 7 : Generalized Anxiety Score 04/06/2016 01/06/2016 10/10/2015  Nervous, Anxious, on Edge '2 3 2  ' Control/stop worrying '1 3 2  ' Worry too much - different things '1 3 1  ' Trouble relaxing '1 3 1  ' Restless '1 2 1  ' Easily annoyed or irritable '2 3 2  ' Afraid - awful might happen 0 2 1  Total GAD 7 Score '8 19 10  ' Anxiety Difficulty Very difficult Extremely difficult Very difficult    Indicated she has been isolating less.  Has stayed in touch with her brother and niece.  Has spent some time with her mom and reports feeling better about their relationship.  Has also stayed in touch with a couple of the individuals she met in [place].            Plan:   Will schedule next appointment for approximately 2 weeks from today.  Indicated she would like to get back to attending sessions on a weekly basis.  Reports she is anxious to start addressing issues related to experiencing trauma.  Diagnosis:  GAD                     Bipolar Disorder   Armandina Stammer 04/06/2016

## 2016-04-22 ENCOUNTER — Ambulatory Visit (HOSPITAL_COMMUNITY): Payer: Self-pay | Admitting: Licensed Clinical Social Worker

## 2016-04-28 ENCOUNTER — Ambulatory Visit (INDEPENDENT_AMBULATORY_CARE_PROVIDER_SITE_OTHER): Payer: PRIVATE HEALTH INSURANCE | Admitting: Licensed Clinical Social Worker

## 2016-04-28 ENCOUNTER — Ambulatory Visit: Payer: Self-pay | Admitting: Family Medicine

## 2016-04-28 DIAGNOSIS — F4310 Post-traumatic stress disorder, unspecified: Secondary | ICD-10-CM

## 2016-04-28 DIAGNOSIS — F3132 Bipolar disorder, current episode depressed, moderate: Secondary | ICD-10-CM | POA: Diagnosis not present

## 2016-04-28 NOTE — Progress Notes (Signed)
THERAPIST PROGRESS NOTE  Session Time: 3:20pm- 4:15pm  Participation Level: Active  Behavioral Response:  Well Dressed Neat Alert Euthymic  Type of Therapy: Individual Therapy  Treatment Goals addressed: Developed new treatment goal today- reduce anger related to relationship with her father  Interventions: Treatment plan review and update  Suicidal/Homicidal: Denied both     Therapist Interventions:   Reviewed treatment plan.  Determined that patient achieved her goal to increase satisfaction with how she is coping with distressing feelings.  Collaborated with patient to develop [name] new goal for her updated treatment plan.     Summary:  Was able to identify several effective coping strategies she has been using when experiencing distressing feelings.   Talked about how she would like to work on issues related to her relationship with her father.  Described how just the thought of him triggers feelings of anger.  Indicated she would like to be able to let go of some of that anger.     Plan: Will educate patient about how trauma impacts individuals.      Diagnosis:   PTSD                     Bipolar Disorder   Brittany [name], Brittany Goodbar A, LCSW 04/28/2016

## 2016-05-06 ENCOUNTER — Ambulatory Visit (HOSPITAL_COMMUNITY): Payer: Self-pay | Admitting: Licensed Clinical Social Worker

## 2016-05-11 ENCOUNTER — Ambulatory Visit (HOSPITAL_COMMUNITY): Payer: Self-pay | Admitting: Licensed Clinical Social Worker

## 2016-05-20 ENCOUNTER — Ambulatory Visit (INDEPENDENT_AMBULATORY_CARE_PROVIDER_SITE_OTHER): Payer: PRIVATE HEALTH INSURANCE | Admitting: Licensed Clinical Social Worker

## 2016-05-20 DIAGNOSIS — F314 Bipolar disorder, current episode depressed, severe, without psychotic features: Secondary | ICD-10-CM | POA: Diagnosis not present

## 2016-05-20 DIAGNOSIS — F4310 Post-traumatic stress disorder, unspecified: Secondary | ICD-10-CM
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
## 2016-05-21 ENCOUNTER — Telehealth (HOSPITAL_COMMUNITY): Payer: Self-pay | Admitting: Licensed Clinical Social Worker

## 2016-05-21 ENCOUNTER — Encounter (HOSPITAL_COMMUNITY): Payer: Self-pay | Admitting: Behavioral Health

## 2016-05-21 ENCOUNTER — Telehealth (HOSPITAL_COMMUNITY): Payer: Self-pay | Admitting: *Deleted

## 2016-05-21 ENCOUNTER — Ambulatory Visit (HOSPITAL_COMMUNITY)
Admission: RE | Admit: 2016-05-21 | Discharge: 2016-05-21 | Disposition: A | Discharge status: Home / Self Care | Payer: 59 | Attending: Psychiatry | Admitting: Psychiatry

## 2016-05-21 DIAGNOSIS — Z818 Family history of other mental and behavioral disorders: Secondary | ICD-10-CM | POA: Insufficient documentation

## 2016-05-21 DIAGNOSIS — E785 Hyperlipidemia, unspecified: Secondary | ICD-10-CM | POA: Insufficient documentation

## 2016-05-21 DIAGNOSIS — E119 Type 2 diabetes mellitus without complications: Secondary | ICD-10-CM | POA: Insufficient documentation

## 2016-05-21 DIAGNOSIS — F331 Major depressive disorder, recurrent, moderate: Secondary | ICD-10-CM | POA: Diagnosis present

## 2016-05-21 DIAGNOSIS — F419 Anxiety disorder, unspecified: Secondary | ICD-10-CM | POA: Insufficient documentation

## 2016-05-21 DIAGNOSIS — I10 Essential (primary) hypertension: Secondary | ICD-10-CM | POA: Insufficient documentation

## 2016-05-21 DIAGNOSIS — F329 Major depressive disorder, single episode, unspecified: Secondary | ICD-10-CM | POA: Insufficient documentation
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
## 2016-05-21 NOTE — Telephone Encounter (Signed)
Therapist checked to see if patient followed through with going to be assessed [facility] and there was nothing in the chart to indicate that she had gone. Called patient to check on her.  There was no answer.  Left her a voicemail letting her know that therapist intended to [place] [facility] [place] police to have them go to her home for a wellness check.  9:15am  Therapist called 9-1-1 and spoke to someone [place] [facility].  He said he would send an officer to patient's residence and have them call back with an update.

## 2016-05-21 NOTE — Telephone Encounter (Signed)
Pt was seen in clinic on 05/20/16 by therapist. The[place] [facility] [employer] to perform a wellness check. Officer Solon PalmJeff Perez called office at 0935 on 8/31 to inform therapist pt is not answering the door.
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
## 2016-05-21 NOTE — Telephone Encounter (Signed)
Therapist called patient again to check on her.  No answer.  Did not leave a voicemail this time.

## 2016-05-26 ENCOUNTER — Other Ambulatory Visit (HOSPITAL_COMMUNITY): Payer: 59 | Attending: Psychiatry | Admitting: Psychiatry

## 2016-05-26 ENCOUNTER — Encounter (HOSPITAL_COMMUNITY): Payer: Self-pay | Admitting: Psychiatry

## 2016-05-26 DIAGNOSIS — F332 Major depressive disorder, recurrent severe without psychotic features: Secondary | ICD-10-CM | POA: Diagnosis present

## 2016-05-26 DIAGNOSIS — F314 Bipolar disorder, current episode depressed, severe, without psychotic features: Secondary | ICD-10-CM
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
## 2016-05-26 NOTE — Progress Notes (Signed)
    Daily Group Progress Note  Program: IOP  Group Time: 9:00-12:00  Participation Level: Active  Behavioral Response: Appropriate  Type of Therapy:  Group Therapy  Summary of Progress: Pt. Met with case manager and psychiatrist for intake assessment.      Nancie Neas, LPC
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
## 2016-05-27 ENCOUNTER — Other Ambulatory Visit (HOSPITAL_COMMUNITY): Payer: 59 | Admitting: Psychiatry

## 2016-05-27 DIAGNOSIS — F332 Major depressive disorder, recurrent severe without psychotic features: Secondary | ICD-10-CM | POA: Diagnosis not present

## 2016-05-27 DIAGNOSIS — F314 Bipolar disorder, current episode depressed, severe, without psychotic features: Secondary | ICD-10-CM

## 2016-05-28 ENCOUNTER — Other Ambulatory Visit (HOSPITAL_COMMUNITY): Payer: 59 | Admitting: Psychiatry

## 2016-05-28 DIAGNOSIS — F314 Bipolar disorder, current episode depressed, severe, without psychotic features: Secondary | ICD-10-CM

## 2016-05-28 DIAGNOSIS — F332 Major depressive disorder, recurrent severe without psychotic features: Secondary | ICD-10-CM | POA: Diagnosis not present

## 2016-05-29 ENCOUNTER — Other Ambulatory Visit (HOSPITAL_COMMUNITY): Payer: 59 | Admitting: Psychiatry

## 2016-05-29 DIAGNOSIS — F314 Bipolar disorder, current episode depressed, severe, without psychotic features: Secondary | ICD-10-CM

## 2016-05-29 DIAGNOSIS — F332 Major depressive disorder, recurrent severe without psychotic features: Secondary | ICD-10-CM | POA: Diagnosis not present

## 2016-05-29 NOTE — Progress Notes (Signed)
    Daily Group Progress Note  Program: IOP   Group Time: 9:00-12:00   Participation Level: active   Behavioral Response:  engaged   Type of Therapy:   group therapy   Summary of Progress:  Client began the session by discussing her Saturday morning routine of cleaning, which began in childhood for her and was initiated by her deceased grandmother. Client elaborated on the importance of her relationship with her grandmother, and how in many ways her grandmother was the only real parent she had. Client expressed that she continues to miss her grandmother, despite her having passed away 14 years ago. Client plans to visit her grandmother's grave on the anniversary of her death, which will be the second time she's done so in 14 years. Client fears she's been avoiding feeling the feelings she suspects will come up when she goes, and she feels its time to let those feelings out.  She discussed feelings of disconnection from both of her biological parents. Mom left when she was 12 and didn't return for a decade. Then, when she returned she tried to pretend she'd never been gone. Client acknowledged she feels resentment for this, but that she fears confronting her mother because she dislikes confrontation. However, client also acknowledged that she needs to confront her mother because things aren't getting better between them.     Shaune PollackBrown, Jennifer B, LPC

## 2016-05-29 NOTE — Progress Notes (Signed)
    Daily Group Progress Note  Program: IOP  Group Time: 9:00-12:00  Participation Level: Active  Behavioral Response: Appropriate  Type of Therapy:  Group Therapy  Summary of Progress: Pt. Presents with flat affect, sad but talkative and engaged in group process. Pt. Discussed awareness that she needs to change jobs and desire to return to school to pursue career in nursing. Pt. Discussed feeling disconnected from family members and co-workers. Pt. Watched and discussed Kelton PillarGuy Winch video about acknowledging and treatment psychological trauma.      Shaune PollackBrown, Michala Deblanc B, LPC

## 2016-05-29 NOTE — Progress Notes (Signed)
    Daily Group Progress Note  Program: IOP  Group Time: 9:00-12:00   Participation Level:  active   Behavioral Response:  engaged   Type of Therapy:   group therapy   Summary of Progress:  Client gave some details regarding the emotional difficulties she's experienced at work which involve feelings of entrapment, and being subjected to unrealistic expectations. She is currently searching for a new job, and feels hopeful about getting out of her current work environment, which she described as, "toxic." Client reported struggling with sleep due to anxiety. Client also reported isolating during stress. In response to this counselor discussed with client a plan for getting out this Friday and enjoying herself. Client came up with a plan to spend Friday doing things she enjoys outside of the house.   Shaune PollackBrown, Champ Keetch B, LPC

## 2016-06-01 ENCOUNTER — Other Ambulatory Visit (HOSPITAL_COMMUNITY): Payer: 59

## 2016-06-02 ENCOUNTER — Other Ambulatory Visit (HOSPITAL_COMMUNITY): Payer: 59 | Admitting: Psychiatry

## 2016-06-02 DIAGNOSIS — F411 Generalized anxiety disorder: Secondary | ICD-10-CM

## 2016-06-02 DIAGNOSIS — F314 Bipolar disorder, current episode depressed, severe, without psychotic features: Secondary | ICD-10-CM

## 2016-06-03 ENCOUNTER — Other Ambulatory Visit (HOSPITAL_COMMUNITY): Payer: 59 | Admitting: Psychiatry

## 2016-06-03 DIAGNOSIS — F314 Bipolar disorder, current episode depressed, severe, without psychotic features: Secondary | ICD-10-CM

## 2016-06-03 DIAGNOSIS — F332 Major depressive disorder, recurrent severe without psychotic features: Secondary | ICD-10-CM | POA: Diagnosis not present

## 2016-06-03 NOTE — Progress Notes (Signed)
Daily Group Progress Note  Program: IOP   Group Time: 10:45-12:00pm  Participation Level: Active  Behavioral Response: Appropriate  Type of Therapy:  Psychoeducation  Summary of Progress: Pt participated in a discussion on destructive family secrets. Families are support systems which encourage the ability to form close relationships. Family members depend upon the trust and communication of loved ones. Pt discussed how her family secrets have manifested into a spiraling depression and anxiety state. Pt was encouraged to use her communication and problem-solving skills to help her work through family dysfunction.   Lisbeth Mackenzie, LCASA 

## 2016-06-03 NOTE — Progress Notes (Signed)
    Daily Group Progress Note  Program: IOP  Group Time: 9:00-10:45   Participation Level:  active   Behavioral Response: engaged   Type of Therapy:   group therapy   Summary of Progress: Client was alert and active during group.  The client discussed stress related to her job.  Counselor encouraged her to continue to apply for new jobs. The client discussed her anger towards her father and mother, as well as her lack of trust for people.  Counselor encouraged client to tell her story from beginning to end, which may provide a sense of relieve.  Counselor also encouraged client to redefine forgiveness in order to forgive her parents and past, but not necessarily forget everything.    Shaune PollackBrown, Aaralyn Kil B, LPC

## 2016-06-04 ENCOUNTER — Other Ambulatory Visit (HOSPITAL_COMMUNITY): Payer: 59 | Admitting: Psychiatry

## 2016-06-04 ENCOUNTER — Ambulatory Visit (HOSPITAL_COMMUNITY): Payer: Self-pay | Admitting: Licensed Clinical Social Worker

## 2016-06-04 DIAGNOSIS — F332 Major depressive disorder, recurrent severe without psychotic features: Secondary | ICD-10-CM | POA: Diagnosis not present

## 2016-06-04 DIAGNOSIS — F314 Bipolar disorder, current episode depressed, severe, without psychotic features: Secondary | ICD-10-CM

## 2016-06-04 NOTE — Progress Notes (Signed)
    Daily Group Progress Note  Program: IOP  Group Time: 9:00-10:45  Participation Level: Active  Behavioral Response: Appropriate  Type of Therapy:  Group Therapy  Summary of Progress: Pt. Presents with mildly lifted affect, appropriately tearful. Pt. Continues to report problems with insomnia. Pt. Reports that she tried trazadone that helped her to fall asleep, but continues to not sleep through the night. Pt. Discussed use of melatonin with psychiatrist and agreed to add to help her sleep. Pt. Continues to develop awareness of her fears about work as the trigger for her depression and insomnia.     Group Time: 10:45-12:00  Participation Level:  Active  Behavioral Response: Appropriate  Type of Therapy: Psycho-education Group  Summary of Progress: Pt. Participated in grief and loss with Theda BelfastBob Hamilton.  Shaune PollackBrown, Jennifer B, LPC

## 2016-06-04 NOTE — Progress Notes (Signed)
    Daily Group Progress Note  Program: IOP  Group Time: 9:00-12:00   Participation Level:  active   Behavioral Response:  engaged   Type of Therapy:  group therapy   Summary of Progress:  Client slept very poorly the night before. She reported having slept only 2.5 hours. The Trazidone she is taking has not been working. The cause of her sleeplessness has been ruminations around her current situation. She fears she will not receive short term disability, that she will be fired from [employer], and that she will end up in poverty. Counselor suggested client challenge her cognitions and later suggested journaling as a way to do this. Client feels very objectified and dehumanized by [employer], and spoke of this at length. Client also struggles to find a way to communicate her struggles to her children. T in the group encouraged client to communicate her feelings to her kids in a palatable way. T also encouraged client to make some peace with her father, whatever it may be. Client was receptive to these ideas.   Shaune PollackBrown, Fitzgerald Dunne B, LPC

## 2016-06-05 ENCOUNTER — Other Ambulatory Visit (HOSPITAL_COMMUNITY): Payer: 59 | Admitting: Psychiatry

## 2016-06-05 DIAGNOSIS — F314 Bipolar disorder, current episode depressed, severe, without psychotic features: Secondary | ICD-10-CM

## 2016-06-05 DIAGNOSIS — F332 Major depressive disorder, recurrent severe without psychotic features: Secondary | ICD-10-CM | POA: Diagnosis not present

## 2016-06-05 NOTE — Progress Notes (Signed)
    Daily Group Progress Note  Program: IOP  Group Time: 9:00-12:00  Participation Level: Active  Behavioral Response: Appropriate  Type of Therapy:  Group Therapy  Summary of Progress: Pt. Presented as talkative, engaged in the group process, improved mood compared to yesterday's group. Pt. Reported that she followed recommendation to take melatonin and was able to sleep 7 hours last night. Pt. Reported that as a result she felt rested, less depressed, and felt that she would be able to sleep tonight. Pt. Discussed plans to visit her grandmother's grave over the weekend, feeling that it was time to take this step in her grief process, and that she did not feel sad, but felt anticipation about going to the grave. Pt. Watched and discussed Shawn Achor video about components of positive psychology and Pension scheme managerintegrating journaling, random acts of kindness, meditation, and gratitude into her life.        Shaune PollackBrown, Jennifer B, LPC

## 2016-06-08 ENCOUNTER — Other Ambulatory Visit (HOSPITAL_COMMUNITY): Payer: 59

## 2016-06-09 ENCOUNTER — Other Ambulatory Visit (HOSPITAL_COMMUNITY): Payer: 59 | Admitting: Psychiatry

## 2016-06-09 DIAGNOSIS — F314 Bipolar disorder, current episode depressed, severe, without psychotic features: Secondary | ICD-10-CM

## 2016-06-09 DIAGNOSIS — F411 Generalized anxiety disorder: Secondary | ICD-10-CM

## 2016-06-10 ENCOUNTER — Other Ambulatory Visit (HOSPITAL_COMMUNITY): Payer: 59 | Admitting: Psychiatry

## 2016-06-10 DIAGNOSIS — F332 Major depressive disorder, recurrent severe without psychotic features: Secondary | ICD-10-CM | POA: Diagnosis not present

## 2016-06-10 DIAGNOSIS — F314 Bipolar disorder, current episode depressed, severe, without psychotic features: Secondary | ICD-10-CM

## 2016-06-10 DIAGNOSIS — F411 Generalized anxiety disorder: Secondary | ICD-10-CM

## 2016-06-10 NOTE — Progress Notes (Signed)
Daily Group Progress Note  Program: IOP  Group Time: 10:45-12:00pm  Participation Level: Active  Behavioral Response: Appropriate  Type of Therapy:  Psychotherapy  Summary of Progress:  Pt participated in chair yoga, facilitated by [facility] therapist and certified yoga instructor Leanne Yates. The activity focused on breath with movement. For patients with anxiety and depression as part of self-care and mindfulness, yoga helps train the relaxation response.    Lisbeth S. Mackenzie, LCAS-A   

## 2016-06-10 NOTE — Progress Notes (Signed)
    Daily Group Progress Note  Program: IOP Group Time: 9:00-12:00   Participation Level: minimal    Behavioral Response:  engaged, responsive   Type of Therapy:   group therapy   Summary of Progress: Client reports sleeping better since beginning melatonin. This is a major relief for her.  Client engaged in the brief guided imagery hypnosis activity. Client was listening attentively throughout the session, and related a lot to another patient in regards to [employer].    Shaune PollackBrown, Jennifer B, LPC

## 2016-06-11 ENCOUNTER — Other Ambulatory Visit (HOSPITAL_COMMUNITY): Payer: 59 | Admitting: Psychiatry

## 2016-06-11 DIAGNOSIS — F332 Major depressive disorder, recurrent severe without psychotic features: Secondary | ICD-10-CM | POA: Diagnosis not present

## 2016-06-11 DIAGNOSIS — F314 Bipolar disorder, current episode depressed, severe, without psychotic features: Secondary | ICD-10-CM

## 2016-06-11 NOTE — Progress Notes (Signed)
    Daily Group Progress Note  Program: IOP  Group Time: 9:00-12:00  Participation Level: Active  Behavioral Response: Appropriate  Type of Therapy:  Group Therapy  Summary of Progress: Pt. Presented with brightened affect, quiet but engaged in the group process. Pt. Attributed the change in her mood to moving past her fear of visiting her grandmother's grave. Pt. Discussed her experience of trying to be an a loving parent and at times going to far to protect her children from her distressing feelings and depression. Pt. Participated in discussion about the benefits of yoga in light of yesterday's yoga session in group and the importance of developing self-care behaviors.     Shaune PollackBrown, Jennifer B, LPC

## 2016-06-12 ENCOUNTER — Other Ambulatory Visit (HOSPITAL_COMMUNITY): Payer: 59

## 2016-06-12 NOTE — Progress Notes (Signed)
    Daily Group Progress Note  Program: IOP  Group Time: 9:00-12:00   Participation Level:  minimal    Behavioral Response: engaged, responsive   Type of Therapy:  group therapy   Summary of Progress: Client reports her sleep disruption has returned. She slept on 4.5 hours the night prior. Most of the session the client listened and occasionally empathized with other members of the group. She did not share any particular details, however counselor went over sleep hygiene and the client seemed engaged in listening.    Shaune PollackBrown, Jennifer B, LPC

## 2016-06-15 ENCOUNTER — Other Ambulatory Visit (HOSPITAL_COMMUNITY): Payer: 59

## 2016-06-16 ENCOUNTER — Other Ambulatory Visit (HOSPITAL_COMMUNITY): Payer: 59

## 2016-06-17 ENCOUNTER — Other Ambulatory Visit (HOSPITAL_COMMUNITY): Payer: 59 | Admitting: Psychiatry

## 2016-06-17 DIAGNOSIS — F314 Bipolar disorder, current episode depressed, severe, without psychotic features: Secondary | ICD-10-CM

## 2016-06-17 DIAGNOSIS — F332 Major depressive disorder, recurrent severe without psychotic features: Secondary | ICD-10-CM | POA: Diagnosis not present

## 2016-06-18 ENCOUNTER — Other Ambulatory Visit (HOSPITAL_COMMUNITY): Payer: 59 | Admitting: Psychiatry

## 2016-06-18 DIAGNOSIS — F314 Bipolar disorder, current episode depressed, severe, without psychotic features: Secondary | ICD-10-CM

## 2016-06-18 DIAGNOSIS — F332 Major depressive disorder, recurrent severe without psychotic features: Secondary | ICD-10-CM | POA: Diagnosis not present

## 2016-06-18 NOTE — Progress Notes (Signed)
  Patient ID: Brittany [name], female   DOB: 12/20/1975, 40 y.o.   MRN: [id number] IOP Discharge:   Date of admission:  05/26/2016 Date of discharge:  06/19/2016 IOP Course:  [name] attended and participated.  She says she feels better, more optimistic and more relaxed. She has made contact with a former friend who is very supportive. She realizes she has to find another job. Mental status:  No suicidal thoughts Diagnosis remains major depression, recurrent,severe without psychotic features Plan is to return to outpatient therapy and psychiatry

## 2016-06-19 ENCOUNTER — Other Ambulatory Visit (HOSPITAL_COMMUNITY): Payer: 59 | Admitting: Psychiatry

## 2016-06-19 DIAGNOSIS — F332 Major depressive disorder, recurrent severe without psychotic features: Secondary | ICD-10-CM | POA: Diagnosis not present

## 2016-06-19 DIAGNOSIS — F314 Bipolar disorder, current episode depressed, severe, without psychotic features: Secondary | ICD-10-CM
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
## 2016-06-19 NOTE — Patient Instructions (Signed)
Pt completed MH-IOP today.  Will follow up [facility] for medication management.  Appt with Dominic PeaSarah [name], LCSW on 06-24-16 @ 2 pm.  Return to work on 06-29-16.  Encouraged support groups.  [facility] (5-6 pm).  Copay is required.  *Call Beth [phone]) if interested.

## 2016-06-19 NOTE — Progress Notes (Signed)
    Daily Group Progress Note  Program: IOP  Group Time: 9:00-12:00   Participation Level:  engaged    Behavioral Response:  responsive/flat   Type of Therapy:   group therapy   Summary of Progress: Client also related to the idea of wearing a mask. Client has come to the decision to leave [employer], though she feels uncomfortable with this decision. Client is working on finding a way to communicate with her children about her mental illness.   Shaune PollackBrown, Jennifer B, LPC

## 2016-06-22 ENCOUNTER — Other Ambulatory Visit (HOSPITAL_COMMUNITY): Payer: 59

## 2016-06-22 NOTE — Progress Notes (Signed)
    Daily Group Progress Note  Program: IOP  Group Time: 9:00-12:00  Participation Level: Active  Behavioral Response: Appropriate  Type of Therapy:  Group Therapy  Summary of Progress: Pt. Continues to present as quiet, but responsive to group process. Pt. Preparing to discharge tomorrow and expressed her readiness to look for another job. Pt. Participated in theme about developing trust for self and helped to identify importance of 1- taking risks, 2- being honest with oneself, 3-making some mistakes, 4- not being afraid to fail, 5- and having the courage to live your life for yourself on your own terms.    Shaune PollackBrown, Rafay Dahan B, LPC

## 2016-06-23 ENCOUNTER — Other Ambulatory Visit: Payer: Self-pay | Admitting: Family Medicine

## 2016-06-23 ENCOUNTER — Other Ambulatory Visit (HOSPITAL_COMMUNITY): Payer: 59

## 2016-06-23 DIAGNOSIS — I10 Essential (primary) hypertension: Secondary | ICD-10-CM

## 2016-06-23 NOTE — Progress Notes (Signed)
    Daily Group Progress Note  Program: IOP Group Time: 9:00-12:00   Participation Level: active    Behavioral Response:  responsive   Type of Therapy:  group therapy   Summary of Progress:  This was the client's discharge day. She spent time with the Child psychotherapistsocial worker. Client expressed concerns about being discharged and losing her safety net of group. However, client feels hopeful she can continue her weekly therapy. Client also expressed fears about leaving her job at [employer], but feels that the choice is to leave or lose her sanity.    Shaune PollackBrown, Jennifer B, LPC

## 2016-06-24 ENCOUNTER — Ambulatory Visit (INDEPENDENT_AMBULATORY_CARE_PROVIDER_SITE_OTHER): Payer: PRIVATE HEALTH INSURANCE | Admitting: Licensed Clinical Social Worker

## 2016-06-24 ENCOUNTER — Other Ambulatory Visit (HOSPITAL_COMMUNITY): Payer: 59

## 2016-06-24 DIAGNOSIS — F314 Bipolar disorder, current episode depressed, severe, without psychotic features: Secondary | ICD-10-CM | POA: Diagnosis not present

## 2016-06-24 DIAGNOSIS — F4310 Post-traumatic stress disorder, unspecified: Secondary | ICD-10-CM

## 2016-06-24 DIAGNOSIS — F411 Generalized anxiety disorder: Secondary | ICD-10-CM | POA: Diagnosis not present

## 2016-06-24 NOTE — Progress Notes (Signed)
THERAPIST PROGRESS NOTE  Session Time: 2:10pm- 3:25pm  Participation Level: Active  Behavioral Response:  Presented wearing sunglasses and when she removed them her eyes looked puffy as though she had been crying Dressed Casually   Alert  Depressed and Tearful  Type of Therapy: Individual Therapy  Treatment Goals addressed: reduce distressing feelings related to issues with family in childhood   Interventions: Assessment, solution focused   Therapist Interventions:   Discussed patient's experience participating in [facility] and how she has been doing since being discharged from the program last Friday. Discussed how she has gone back to isolating herself from others and how this is keeping her depression going.  Helped patient develop a plan for reaching out for support.      Suicidal/Homicidal: Admitted to suicidal ideation with a plan as recent as yesterday (driving head on into a bulldozer)  Denied intent today.  Patient expressed a belief that she may need to be admitted for hospitalization.  She explained that it is important to her to have a conversation with her son before she seeks admission.  It is his birthday this Saturday.  She wants to have an opportunity to explain to him what is going on with her and why she won't be able to make it to his birthday party.  Plans to get him a birthday present today, present it to him tomorrow after school, and seek hospital admission on Friday morning.  Noted that she still has the crisis phone number for [facility] and if necessary she will call for help.  Denied HI     Summary: Reported "I'm not doing so good.  Today is the first day I've left the house since Friday."  Explained that she had intentions of going to her son's soccer game over the weekend but never made it because she was feeling so depressed.  She said, "It's like I have no desire to interact with anyone or even leave my house."  Feels excessive guilt about  disappointing her son.  Apologized to him via text but he has not responded.   Talked about feeling very alone.  Noted that she has not heard from her mother or brother.  With encouragement from the therapist she sent her brother a short text message requesting that he get in touch with her.  She also agreed to reach out to her niece, someone who has been a good source of support in the past.      Plan: Will check to see if patient follows through on Friday with seeking hospitalization.  If there is no evidence in her chart of being assessed therapist will call to check on patient.  Diagnosis:   PTSD                     Bipolar Disorder   Darrin LuisSolomon, Sandip Power A, LCSW 06/24/2016

## 2016-06-25 ENCOUNTER — Other Ambulatory Visit (HOSPITAL_COMMUNITY): Payer: 59

## 2016-06-26 ENCOUNTER — Telehealth (HOSPITAL_COMMUNITY): Payer: Self-pay | Admitting: Licensed Clinical Social Worker

## 2016-06-26 ENCOUNTER — Other Ambulatory Visit (HOSPITAL_COMMUNITY): Payer: 59

## 2016-06-26 NOTE — Telephone Encounter (Signed)
Patient called therapist to inform her that she is planning on attending her son's birthday party tomorrow and that she would be putting up seeking hospital admission.  Discussed with patient that it doesn't make sense to plan for hospitalization.  Recommended an alternative service called Partial Hospitalization.  Provided her with contact information for [facility] where they offer the program.  Patient agreed to pursue this treatment option.

## 2016-06-29 ENCOUNTER — Other Ambulatory Visit (HOSPITAL_COMMUNITY): Payer: 59

## 2016-06-30 ENCOUNTER — Other Ambulatory Visit (HOSPITAL_COMMUNITY): Payer: 59

## 2016-07-01 ENCOUNTER — Other Ambulatory Visit (HOSPITAL_COMMUNITY): Payer: 59

## 2016-07-02 ENCOUNTER — Other Ambulatory Visit (HOSPITAL_COMMUNITY): Payer: 59

## 2016-07-03 ENCOUNTER — Other Ambulatory Visit (HOSPITAL_COMMUNITY): Payer: 59

## 2016-07-06 ENCOUNTER — Other Ambulatory Visit (HOSPITAL_COMMUNITY): Payer: 59

## 2016-07-06 ENCOUNTER — Ambulatory Visit (HOSPITAL_COMMUNITY): Payer: Self-pay | Admitting: Licensed Clinical Social Worker

## 2016-07-07 ENCOUNTER — Other Ambulatory Visit (HOSPITAL_COMMUNITY): Payer: 59

## 2016-07-08 ENCOUNTER — Other Ambulatory Visit (HOSPITAL_COMMUNITY): Payer: 59

## 2016-07-09 ENCOUNTER — Other Ambulatory Visit (HOSPITAL_COMMUNITY): Payer: 59

## 2016-07-10 ENCOUNTER — Other Ambulatory Visit (HOSPITAL_COMMUNITY): Payer: 59

## 2016-07-13 ENCOUNTER — Other Ambulatory Visit (HOSPITAL_COMMUNITY): Payer: 59

## 2016-07-14 ENCOUNTER — Other Ambulatory Visit (HOSPITAL_COMMUNITY): Payer: 59

## 2016-07-15 ENCOUNTER — Other Ambulatory Visit (HOSPITAL_COMMUNITY): Payer: 59

## 2016-07-16 ENCOUNTER — Other Ambulatory Visit (HOSPITAL_COMMUNITY): Payer: 59

## 2016-07-17 ENCOUNTER — Other Ambulatory Visit (HOSPITAL_COMMUNITY): Payer: 59

## 2016-07-20 ENCOUNTER — Other Ambulatory Visit (HOSPITAL_COMMUNITY): Payer: 59

## 2016-07-30 ENCOUNTER — Other Ambulatory Visit: Payer: Self-pay | Admitting: Physician Assistant

## 2016-07-30 ENCOUNTER — Other Ambulatory Visit: Payer: Self-pay

## 2016-07-30 DIAGNOSIS — I10 Essential (primary) hypertension: Secondary | ICD-10-CM

## 2016-07-30 MED ORDER — GLIPIZIDE ER 10 MG PO TB24: 10.0000 mg | ORAL_TABLET | Freq: Every day | ORAL | 0 refills | Status: DC

## 2016-08-10 ENCOUNTER — Ambulatory Visit (INDEPENDENT_AMBULATORY_CARE_PROVIDER_SITE_OTHER): Payer: 59 | Admitting: Physician Assistant

## 2016-08-10 ENCOUNTER — Encounter: Payer: Self-pay | Admitting: Physician Assistant

## 2016-08-10 VITALS — BP 159/81 | HR 67 | Ht 63.0 in | Wt 226.0 lb

## 2016-08-10 DIAGNOSIS — E119 Type 2 diabetes mellitus without complications: Secondary | ICD-10-CM | POA: Diagnosis not present

## 2016-08-10 DIAGNOSIS — R21 Rash and other nonspecific skin eruption: Secondary | ICD-10-CM | POA: Diagnosis not present

## 2016-08-10 LAB — POCT GLYCOSYLATED HEMOGLOBIN (HGB A1C): HEMOGLOBIN A1C: 9

## 2016-08-10 MED ORDER — EMPAGLIFLOZIN 25 MG PO TABS: 25.0000 mg | ORAL_TABLET | Freq: Every day | ORAL | 0 refills | Status: DC

## 2016-08-10 MED ORDER — LIRAGLUTIDE 18 MG/3ML ~~LOC~~ SOPN: PEN_INJECTOR | SUBCUTANEOUS | 2 refills | Status: DC

## 2016-08-10 NOTE — Progress Notes (Addendum)
Subjective:    Patient ID: Brittany [name] Grissett, female    DOB: 10/22/1975, 40 y.o.   MRN: [id number]  HPI  Patient is a 40 yo female who presents to the office for diabetes management. Patient is currently prescribed Glipizide 10 mg and Jardiance 25 mg to manage her diabetes. Hemoglobin A1C decreased from 11.2 in May 2017 to 9.0 in November 2017. Patient states that she checks her glucose every day when she wakes up, before she eats and two hours after eating. She states that her glucose levels are ranging between 102-120. Patient states that Dr. Ivan AnchorsHommel placed patient on Glipizide due to patient wanting to become pregnant six months ago; however, patient states that she no longer wants to become pregnant within the next year. Patient had a tubal ligation in 2002; however, patient found out after becoming pregnant a few years ago that one of her fallopian tubes became untied. Patient had a miscarriage three months into pregnancy. Patient is currently on her menstrual cycle. Patients husband is using condoms during intercourse to prevent pregnancy.   Patient has another separate compliant of a rash that comes and goes and has been present three weeks. Patient showed pictures on her phone of the rash. The rash occurs on her neck, arms and legs. Patient denies changing laundry detergent, soap or a change in medications. Patient states that the rash will start itching and then spread to different parts of her body. She takes Benadryl for relieve and then the rash will disappear. Patient denies shortness of breath, difficulty swallowing or chest pain. Patient has no known allergies to the environment or food.    Review of Systems  Respiratory: Negative for chest tightness, shortness of breath and wheezing.   Cardiovascular: Negative for chest pain and palpitations.       Objective:   Physical Exam  Constitutional: She appears well-developed and well-nourished.  Neck:  Buffalo hump present    Cardiovascular: Normal rate and regular rhythm.  Exam reveals no gallop and no friction rub.   No murmur heard. Pulmonary/Chest: Breath sounds normal. No respiratory distress. She has no wheezes. She has no rales.  Skin: Skin is warm. No rash noted. No erythema.  Rash was not currently present during exam.           Assessment & Plan:  Brittany [name] was seen today for diabetes and rash.  Diagnoses and all orders for this visit:  Type 2 diabetes mellitus without complication, without long-term current use of insulin (HCC) -     POCT HgB A1C -     liraglutide (VICTOZA) 18 MG/3ML SOPN; Started 0.6 mg injected subcutaneously daily for one week, then increase to 1.2 mg injected subcutaneously daily.  Please include needles. -     empagliflozin (JARDIANCE) 25 MG TABS tablet; Take 25 mg by mouth daily.  Rash and nonspecific skin eruption -     Cortisol, urine, 24 hour; Future -     COMPLETE METABOLIC PANEL WITH GFR -     Cortisol, urine, 24 hour  Start patient on Victoza. Patient was instructed on how to inject medication. Continue taking glipizide and jardiance for glucose control in order to get A1C down to at least 7. Patient to return for follow-up in three months to re-check A1C. Pt was educated not to become pregnant on these medications.    In order to evaluate patient's rash, cortisol 24 hr urine was order along with CMP panel with GFR in order to evaluate for Cushings or  possible decline in renal or liver damage that could be causing the rash. Discussed zyrtec daily to block histamine response.  If the results come back with no abnormalities, then patient will be referred for allergy testing. Patient will be called with these results once they have resulted. Have patient call office if symptoms worsen.  Spent 30 minutes with patient and greater than 50 percent of visit spent counseling patient.

## 2016-08-11 LAB — COMPLETE METABOLIC PANEL WITH GFR
ALT: 66 U/L — AB (ref 6–29)
AST: 37 U/L — ABNORMAL HIGH (ref 10–30)
Albumin: 4 g/dL (ref 3.6–5.1)
Alkaline Phosphatase: 60 U/L (ref 33–115)
BILIRUBIN TOTAL: 0.3 mg/dL (ref 0.2–1.2)
BUN: 12 mg/dL (ref 7–25)
CO2: 26 mmol/L (ref 20–31)
CREATININE: 0.59 mg/dL (ref 0.50–1.10)
Calcium: 9.1 mg/dL (ref 8.6–10.2)
Chloride: 105 mmol/L (ref 98–110)
GFR, Est Non African American: >89 mL/min (ref >=60)
Glucose, Bld: 105 mg/dL — ABNORMAL HIGH (ref 65–99)
Potassium: 4.2 mmol/L (ref 3.5–5.3)
Sodium: 139 mmol/L (ref 135–146)
TOTAL PROTEIN: 7.3 g/dL (ref 6.1–8.1)

## 2016-08-21 ENCOUNTER — Other Ambulatory Visit: Payer: Self-pay

## 2016-08-21 DIAGNOSIS — I10 Essential (primary) hypertension: Secondary | ICD-10-CM

## 2016-08-21 MED ORDER — METOPROLOL SUCCINATE ER 50 MG PO TB24: 50.0000 mg | ORAL_TABLET | Freq: Every day | ORAL | 5 refills | Status: DC

## 2016-08-28 ENCOUNTER — Other Ambulatory Visit: Payer: Self-pay | Admitting: *Deleted

## 2016-08-28 DIAGNOSIS — I10 Essential (primary) hypertension: Secondary | ICD-10-CM

## 2016-08-28 MED ORDER — METOPROLOL SUCCINATE ER 50 MG PO TB24: 50.0000 mg | ORAL_TABLET | Freq: Every day | ORAL | 0 refills | Status: DC

## 2016-09-01 ENCOUNTER — Telehealth: Payer: Self-pay | Admitting: *Deleted

## 2016-09-01 ENCOUNTER — Other Ambulatory Visit: Payer: Self-pay | Admitting: *Deleted

## 2016-09-01 DIAGNOSIS — R748 Abnormal levels of other serum enzymes: Secondary | ICD-10-CM

## 2016-09-01 MED ORDER — AMBULATORY NON FORMULARY MEDICATION: 1 refills | Status: DC

## 2016-09-01 NOTE — Telephone Encounter (Signed)
CMP ordered to recheck liver enzymes. 

## 2016-09-01 NOTE — Progress Notes (Unsigned)
Test strips ordered.

## 2016-09-12 LAB — COMPLETE METABOLIC PANEL WITH GFR
ALBUMIN: 3.7 g/dL (ref 3.6–5.1)
ALK PHOS: 60 U/L (ref 33–115)
ALT: 50 U/L — AB (ref 6–29)
AST: 26 U/L (ref 10–30)
BUN: 9 mg/dL (ref 7–25)
CALCIUM: 9 mg/dL (ref 8.6–10.2)
CO2: 23 mmol/L (ref 20–31)
CREATININE: 0.69 mg/dL (ref 0.50–1.10)
Chloride: 109 mmol/L (ref 98–110)
GFR, Est African American: >89 mL/min (ref >=60)
GFR, Est Non African American: >89 mL/min (ref >=60)
Glucose, Bld: 202 mg/dL — ABNORMAL HIGH (ref 65–99)
POTASSIUM: 4.2 mmol/L (ref 3.5–5.3)
Sodium: 142 mmol/L (ref 135–146)
Total Bilirubin: 0.3 mg/dL (ref 0.2–1.2)
Total Protein: 6.8 g/dL (ref 6.1–8.1)

## 2016-09-22 ENCOUNTER — Ambulatory Visit (HOSPITAL_COMMUNITY): Payer: Self-pay | Admitting: Psychiatry

## 2016-09-23 ENCOUNTER — Ambulatory Visit (HOSPITAL_COMMUNITY): Payer: Self-pay | Admitting: Licensed Clinical Social Worker

## 2016-10-07 ENCOUNTER — Ambulatory Visit (HOSPITAL_COMMUNITY): Payer: Self-pay | Admitting: Licensed Clinical Social Worker

## 2016-10-16 ENCOUNTER — Other Ambulatory Visit: Payer: Self-pay

## 2016-10-16 DIAGNOSIS — E119 Type 2 diabetes mellitus without complications: Secondary | ICD-10-CM

## 2016-10-16 MED ORDER — LIRAGLUTIDE 18 MG/3ML ~~LOC~~ SOPN: PEN_INJECTOR | SUBCUTANEOUS | 2 refills | Status: DC

## 2016-10-21 ENCOUNTER — Ambulatory Visit (HOSPITAL_COMMUNITY): Payer: Self-pay | Admitting: Psychiatry

## 2016-10-21 ENCOUNTER — Other Ambulatory Visit: Payer: Self-pay

## 2016-10-21 DIAGNOSIS — E119 Type 2 diabetes mellitus without complications: Secondary | ICD-10-CM

## 2016-10-21 MED ORDER — LIRAGLUTIDE 18 MG/3ML ~~LOC~~ SOPN: 1.2000 mg | PEN_INJECTOR | Freq: Every morning | SUBCUTANEOUS | 0 refills | Status: DC

## 2016-10-27 ENCOUNTER — Ambulatory Visit (INDEPENDENT_AMBULATORY_CARE_PROVIDER_SITE_OTHER): Payer: PRIVATE HEALTH INSURANCE | Admitting: Licensed Clinical Social Worker

## 2016-10-27 DIAGNOSIS — F411 Generalized anxiety disorder: Secondary | ICD-10-CM | POA: Diagnosis not present

## 2016-10-27 DIAGNOSIS — F3132 Bipolar disorder, current episode depressed, moderate: Secondary | ICD-10-CM | POA: Diagnosis not present

## 2016-10-27 DIAGNOSIS — F4310 Post-traumatic stress disorder, unspecified: Secondary | ICD-10-CM | POA: Diagnosis not present

## 2016-10-27 NOTE — Progress Notes (Signed)
THERAPIST PROGRESS NOTE  Session Time: 1:05pm-2:00pm  Participation Level: Active  Behavioral Response:  Well groomed    Alert  Mostly euthymic  Type of Therapy: Individual Therapy  Treatment Goals addressed: Revised treatment plan today- take steps towards furthering her career and/or education  Interventions: Assessment, treatment planning  Suicidal/Homicidal: Denied both  Therapist Interventions:   Gathered information about significant events and changes in mood and functioning since last seen in early October. Reviewed treatment plan.  Collaborated with patient to revise her treatment goals.  Decided patient needs to focus on coping effectively with issues in the present before addressing issues related to issues related to her relationship with her father in childhood.    Summary:  Reported that she had followed through with therapist's recommendation to pursue Partial Hospitalization at the time of her last therapy appointment.  She participated in the program at [facility] in [place] for about [name] month.  She then transitioned into [name] also at [facility].  She did this program for about 6 weeks.  Reported having [name] good experience with both of these programs.  Returned to work on December 26th.  Noted that she was in [name] transition period for the first few weeks, but she has gone back to full duties in the past two weeks.  Talked about how she has decided to give herself 3 months to work on obtaining employment elsewhere and if that doesn't happen she will resign and use funds from her 401K to pay to go back to school.  Indicated that her motivation to apply for different jobs has increased because she has accepted that she isn't likely to find [name] position that pays as well as her current job.  Decided on the following new treatment goal: Brittany [name] will report feeling satisfied with her efforts towards moving forward with goals related to furthering her career and/or  education.   Plan:  Next appointment has been scheduled in approximately 3 weeks.    Diagnosis:    Generalized Anxiety Disorder PTSD Bipolar Disorder   Darrin LuisSolomon, Brittany Mcconaughy A, LCSW 10/27/2016

## 2016-10-28 ENCOUNTER — Telehealth: Payer: Self-pay | Admitting: *Deleted

## 2016-10-28 NOTE — Telephone Encounter (Signed)
Submitted a PA for [employer]. I think this may be denied since one of the questions was has the patient tried and failed farxiga or invokana. [id number] - PA Case ID: [id number]

## 2016-11-05 ENCOUNTER — Telehealth: Payer: Self-pay | Admitting: *Deleted

## 2016-11-05 NOTE — Telephone Encounter (Signed)
[id number]

## 2016-11-07 LAB — HM DIABETES EYE EXAM

## 2016-11-10 ENCOUNTER — Ambulatory Visit: Payer: Self-pay | Admitting: Physician Assistant

## 2016-11-10 NOTE — Telephone Encounter (Signed)
Please send farxiga 10mg  daily #30 2 refills.

## 2016-11-10 NOTE — Telephone Encounter (Signed)
Insurance denied jardiance. Formulary alternatives are farxiga and Invokana

## 2016-11-11 ENCOUNTER — Telehealth (HOSPITAL_COMMUNITY): Payer: Self-pay | Admitting: *Deleted

## 2016-11-11 MED ORDER — DAPAGLIFLOZIN PROPANEDIOL 10 MG PO TABS: 10.0000 mg | ORAL_TABLET | Freq: Every day | ORAL | 2 refills | Status: DC

## 2016-11-11 NOTE — Telephone Encounter (Signed)
Pt would like to speak with Therapist about paperwork submitted to employer. Please return call at [phone].

## 2016-11-11 NOTE — Telephone Encounter (Signed)
Called and left message on patients vm.rx sent

## 2016-11-13 NOTE — Telephone Encounter (Signed)
Returned call, but no answer.  Left a message requesting her to call back.

## 2016-11-15 ENCOUNTER — Encounter: Payer: Self-pay | Admitting: Physician Assistant

## 2016-11-18 ENCOUNTER — Ambulatory Visit (INDEPENDENT_AMBULATORY_CARE_PROVIDER_SITE_OTHER): Payer: PRIVATE HEALTH INSURANCE | Admitting: Licensed Clinical Social Worker

## 2016-11-18 DIAGNOSIS — F4310 Post-traumatic stress disorder, unspecified: Secondary | ICD-10-CM | POA: Diagnosis not present

## 2016-11-18 DIAGNOSIS — F411 Generalized anxiety disorder: Secondary | ICD-10-CM | POA: Diagnosis not present

## 2016-11-18 DIAGNOSIS — F3181 Bipolar II disorder: Secondary | ICD-10-CM | POA: Diagnosis not present

## 2016-11-18 NOTE — Progress Notes (Signed)
THERAPIST PROGRESS NOTE  Session Time: 9:3048m-10:00am  Participation Level: Active  Behavioral Response:  Well groomed    Alert  Mostly euthymic  Type of Therapy: Individual Therapy  Treatment Goals addressed:  Take steps towards furthering her career and/or education  Interventions: Motivational interviewing, assessing support system  Suicidal/Homicidal: Denied both  Therapist Interventions:   Discussed progress with moving forward with her career and education goals. Talked about how patient has a new source of support in her life and how she is adjusting to having that type of support.      Summary:   She is considering an online program for medical assistance.  Planning to do a video conference with someone to learn more about the program.   Also planning to find out more information about tuition reimbursement from her employer.  Disclosed that she has a boyfriend.  It is a long distance relationship, but he visits about every other weekend.  She said "I definitely think he has played a part with how much progress I have made.  He motivates me, encourages me not to dwell on the negative parts of my day, and he is patient with me.  He has been accepting of my mental health issues."        Plan:   Will schedule next appointment for approximately 2 weeks from now.  Diagnosis:    Generalized Anxiety Disorder PTSD Bipolar Disorder   Darrin LuisSolomon, Messiah Ahr A, LCSW 11/18/2016

## 2016-12-07 ENCOUNTER — Other Ambulatory Visit: Payer: Self-pay | Admitting: Physician Assistant

## 2016-12-07 DIAGNOSIS — Z1231 Encounter for screening mammogram for malignant neoplasm of breast: Secondary | ICD-10-CM

## 2016-12-08 ENCOUNTER — Ambulatory Visit (HOSPITAL_COMMUNITY): Payer: Self-pay | Admitting: Licensed Clinical Social Worker

## 2016-12-21 ENCOUNTER — Ambulatory Visit (INDEPENDENT_AMBULATORY_CARE_PROVIDER_SITE_OTHER): Payer: PRIVATE HEALTH INSURANCE | Admitting: Licensed Clinical Social Worker

## 2016-12-21 DIAGNOSIS — F4310 Post-traumatic stress disorder, unspecified: Secondary | ICD-10-CM

## 2016-12-21 DIAGNOSIS — F3181 Bipolar II disorder: Secondary | ICD-10-CM

## 2016-12-21 DIAGNOSIS — F411 Generalized anxiety disorder: Secondary | ICD-10-CM | POA: Diagnosis not present

## 2016-12-21 NOTE — Progress Notes (Signed)
THERAPIST PROGRESS NOTE  Session Time: 2:15pm-3:05pm  Participation Level: Active  Behavioral Response:  Well groomed    Alert  Mostly euthymic  Type of Therapy: Individual Therapy  Treatment Goals addressed:  Take steps towards furthering her career and/or education  Interventions: Reframing  Suicidal/Homicidal: Denied both  Therapist Interventions:   Discussed how patient is moving forward with her goals and is still committed to the timeline she set for herself. Explored how over the past year she has changed her perspective on what is most important.        Summary:  Reported she has continued to apply for jobs.  Has an interview later this week.  Still moving forward with plans to go back to school.  Looking forward to new opportunities that will open up for her.  Also looking forward to her son returning home for the next school year.  Commented on how their relationship has improved within the past few months.  Reflected on how she is being more open with him about how she is doing.    Acknowledged that she spent a long time prioritizing her job over her health and well being.  She now realizes that good pay and benefits are not worth the stress she experiences in return.  Realizes it is within her power to make changes and reduce her stress level.          Plan:   Return in approximately 2 weeks.  Diagnosis:    Generalized Anxiety Disorder PTSD Bipolar Disorder   Darrin Luis 12/21/2016

## 2016-12-22 ENCOUNTER — Ambulatory Visit (INDEPENDENT_AMBULATORY_CARE_PROVIDER_SITE_OTHER): Payer: 59 | Admitting: Physician Assistant

## 2016-12-22 ENCOUNTER — Ambulatory Visit (INDEPENDENT_AMBULATORY_CARE_PROVIDER_SITE_OTHER): Payer: 59

## 2016-12-22 ENCOUNTER — Encounter: Payer: Self-pay | Admitting: Physician Assistant

## 2016-12-22 VITALS — BP 142/98 | HR 67 | Ht 63.0 in | Wt 220.0 lb

## 2016-12-22 DIAGNOSIS — Z1231 Encounter for screening mammogram for malignant neoplasm of breast: Secondary | ICD-10-CM

## 2016-12-22 DIAGNOSIS — E1165 Type 2 diabetes mellitus with hyperglycemia: Secondary | ICD-10-CM | POA: Diagnosis not present

## 2016-12-22 DIAGNOSIS — E118 Type 2 diabetes mellitus with unspecified complications: Secondary | ICD-10-CM | POA: Diagnosis not present

## 2016-12-22 DIAGNOSIS — I10 Essential (primary) hypertension: Secondary | ICD-10-CM

## 2016-12-22 DIAGNOSIS — IMO0002 Reserved for concepts with insufficient information to code with codable children: Secondary | ICD-10-CM

## 2016-12-22 LAB — POCT UA - MICROALBUMIN
Creatinine, POC: 300 mg/dL
Microalbumin Ur, POC: 80 mg/L

## 2016-12-22 LAB — POCT GLYCOSYLATED HEMOGLOBIN (HGB A1C): HEMOGLOBIN A1C: 9.1

## 2016-12-22 MED ORDER — LISINOPRIL 5 MG PO TABS: 5.0000 mg | ORAL_TABLET | Freq: Every day | ORAL | 2 refills | Status: DC

## 2016-12-22 MED ORDER — METOPROLOL SUCCINATE ER 50 MG PO TB24: 50.0000 mg | ORAL_TABLET | Freq: Every day | ORAL | 0 refills | Status: DC

## 2016-12-22 MED ORDER — GLIPIZIDE ER 10 MG PO TB24: 10.0000 mg | ORAL_TABLET | Freq: Every day | ORAL | 2 refills | Status: DC

## 2016-12-22 MED ORDER — DAPAGLIFLOZIN PROPANEDIOL 10 MG PO TABS: 10.0000 mg | ORAL_TABLET | Freq: Every day | ORAL | 2 refills | Status: DC

## 2016-12-22 MED ORDER — LIRAGLUTIDE 18 MG/3ML ~~LOC~~ SOPN: 1.8000 mg | PEN_INJECTOR | Freq: Every morning | SUBCUTANEOUS | 2 refills | Status: DC

## 2016-12-22 NOTE — Progress Notes (Signed)
Call pt: normal mammogram. Follow up in one year.

## 2016-12-22 NOTE — Progress Notes (Signed)
   Subjective:    Patient ID: Brittany [name], female    DOB: [birth year], 41 y.o.   MRN: [id number]  HPI  Pt is a 41 yo female who presents to clinic for 3 month DM follow up.   DM- pt is not checking her sugars. She admits to not taking oral medications. She is only taking victoza 1.2mg  daily. She has decreased her soda intact from 2-3 a day to 2-3 a week. She has cut down on bread. She is walking a day. No hypoglycemic events. No open sores or wounds.   HTN- taking metoprolol. Denies any CP, palpitations, headaches, SOB, dizziness. Taking BP at home and in the 120's over 70's.    Review of Systems    see HPI>  Objective:   Physical Exam  Constitutional: She is oriented to person, place, and time. She appears well-developed and well-nourished.  HENT:  Head: Normocephalic and atraumatic.  Cardiovascular: Normal rate, regular rhythm and normal heart sounds.   Pulmonary/Chest: Effort normal and breath sounds normal.  Neurological: She is alert and oriented to person, place, and time.  Psychiatric: She has a normal mood and affect. Her behavior is normal.          Assessment & Plan:  Marland KitchenMarland KitchenDiagnoses and all orders for this visit:  Type 2 diabetes mellitus without complication, without long-term current use of insulin (HCC) -     POCT HgB A1C -     dapagliflozin propanediol (FARXIGA) 10 MG TABS tablet; Take 10 mg by mouth daily. -     glipiZIDE (GLUCOTROL XL) 10 MG 24 hr tablet; Take 1 tablet (10 mg total) by mouth daily with breakfast. -     liraglutide (VICTOZA) 18 MG/3ML SOPN; Inject 0.3 mLs (1.8 mg total) into the skin every morning. Please include needles. -     POCT UA - Microalbumin -     lisinopril (PRINIVIL,ZESTRIL) 5 MG tablet; Take 1 tablet (5 mg total) by mouth daily.  Essential hypertension -     metoprolol succinate (TOPROL-XL) 50 MG 24 hr tablet; Take 1 tablet (50 mg total) by mouth daily.   .. Lab Results  Component Value Date   HGBA1C 9.1 12/22/2016    a1c went up a tad bit.  Increased victoza to 1.8mg  daily.  START oral medications. She cannot tolerate metformin.  Refilled glipizide and farxiga.  microalbuminuria positive for protein. Started lisinopril  daily.  Follow up in 3 months.  Discussed starting insulin if no benefit. Could consider actos as well.   HTN- BP elevated. Per patient checks at home and in the 120's over 70's. Started lisinopril should help with BP too.   Spent 30 minutes with patient and greater than 50 percent of visit spent counseling patient regarding treatment plan.

## 2016-12-25 ENCOUNTER — Ambulatory Visit: Payer: Self-pay

## 2017-01-05 ENCOUNTER — Ambulatory Visit (HOSPITAL_COMMUNITY): Payer: Self-pay | Admitting: Licensed Clinical Social Worker

## 2017-01-13 ENCOUNTER — Ambulatory Visit (HOSPITAL_COMMUNITY): Payer: Self-pay | Admitting: Licensed Clinical Social Worker

## 2017-01-26 ENCOUNTER — Telehealth (HOSPITAL_COMMUNITY): Payer: Self-pay | Admitting: Licensed Clinical Social Worker

## 2017-01-26 NOTE — Telephone Encounter (Signed)
Called back.  No answer so left a voicemail requesting return call.

## 2017-01-26 NOTE — Telephone Encounter (Signed)
Pt left message up front to have Sarah Call her that it was important. No other information was given

## 2017-01-27 ENCOUNTER — Ambulatory Visit (INDEPENDENT_AMBULATORY_CARE_PROVIDER_SITE_OTHER): Payer: PRIVATE HEALTH INSURANCE | Admitting: Licensed Clinical Social Worker

## 2017-01-27 DIAGNOSIS — F411 Generalized anxiety disorder: Secondary | ICD-10-CM | POA: Diagnosis not present

## 2017-01-27 DIAGNOSIS — F3181 Bipolar II disorder: Secondary | ICD-10-CM | POA: Diagnosis not present

## 2017-01-27 DIAGNOSIS — F4310 Post-traumatic stress disorder, unspecified: Secondary | ICD-10-CM

## 2017-01-28 NOTE — Progress Notes (Signed)
THERAPIST PROGRESS NOTE  Session Time: 3:05pm-4:00pm  Participation Level: Active  Behavioral Response:  Casual   Alert  Tearful Depressed  Type of Therapy: Individual Therapy  Treatment Goals addressed:  Take steps towards furthering her career and/or education  Interventions:   Suicidal/Homicidal: Denied both  Therapist Interventions:   Processed thoughts and feelings about recent events which have contributed to a significant decline in mood and functioning. Encouraged patient to consider approaching her manager to explain why their interaction last week had left her feeling frustrated and hopeless.  Discussed how the lack of validation she received reminded her of how she was often treated as she was growing up.  Discussed how patient does not feel like she can function at work in her current state.       Summary:  Reported she was offered a job but she did not take it because the health insurance benefits would not be available until she has been working there for a year.  Learning that her son will need knee surgery further influenced her decision not to accept the job.   Reported that she had a meeting with her supervisor last week.  Talked about how she had been someone who had always been understanding about the difficulties of the job, but during the interaction on Friday she was not receptive to listening to patient's concerns.  Patient spent the weekend in bed.  Did not shower.  Did not eat.  Also reported pulling her hair out some.  When Monday came she was overcome with a sense of dread.  Did not go to work any day this week. Patient agreed that it would be a good idea to initiate further discussion with her supervisor but said she didn't think now was the appropriate time to do so considering how she doesn't feel as though she has much control of her emotions at this time.          Plan:   Scheduled to return May 21st.    Diagnosis:    Generalized Anxiety  Disorder PTSD Bipolar Disorder   Marilu FavreSolomon, [name], LCSW 01/27/2017

## 2017-02-04 ENCOUNTER — Telehealth (HOSPITAL_COMMUNITY): Payer: Self-pay | Admitting: Licensed Clinical Social Worker

## 2017-02-04 NOTE — Telephone Encounter (Signed)
Patient states her disability has not received anything from us and would like to know if we sent the forms she had you fill out?   Please advise.

## 2017-02-08 ENCOUNTER — Ambulatory Visit (INDEPENDENT_AMBULATORY_CARE_PROVIDER_SITE_OTHER): Payer: PRIVATE HEALTH INSURANCE | Admitting: Licensed Clinical Social Worker

## 2017-02-08 DIAGNOSIS — F314 Bipolar disorder, current episode depressed, severe, without psychotic features: Secondary | ICD-10-CM

## 2017-02-08 DIAGNOSIS — F4310 Post-traumatic stress disorder, unspecified: Secondary | ICD-10-CM | POA: Diagnosis not present

## 2017-02-08 DIAGNOSIS — F411 Generalized anxiety disorder: Secondary | ICD-10-CM | POA: Diagnosis not present

## 2017-02-08 NOTE — Progress Notes (Signed)
THERAPIST PROGRESS NOTE  Session Time: 11:05am-12:05pm  Participation Level: Active  Behavioral Response:  Casual   Alert  Tearful Depressed  Type of Therapy: Individual Therapy  Treatment Goals addressed:  Take steps towards furthering her career and/or education, reduce depressive symptoms  Interventions: Assessment, Treatment planning, Reframing  Suicidal/Homicidal: Denied both  Therapist Interventions:   Assessed for any changes in mood and functioning since last seen May 9th.  Explored different treatment options including [facility] and Partial Hospitalization.  Encouraged patient to look at pursuing these treatment options as an opportunity to see a psychiatrist sooner, connect with others, provide structure to her day, and learn additional coping skills.  Provided patient with contact information for the therapist who runs the [facility] Hospital[facility] program.        Summary:  Reported no significant improvement with her mood and functioning.  Hasn't been sleeping.  Her appetite is poor.  Reported having a lot of crying spells and feeling "very moody."  Admitted to isolating a lot.  Noted she hasn't left her house since Wednesday.  Commented on how she believes she needs an adjustment to her medication, because she has been practicing coping skills she has found helpful in the past and it doesn't seem to be helping.  Noted that she made an appointment with a new psychiatrist, Dr Lolly MustacheArfeen, but it is not until mid-July.  Talked about how she struggles with the question of why she is feeling so depressed once again.  Described feeling like she is "running on a hamster wheel," putting in a lot of effort but ending up right back where she started.   Decided to pursue the Partial Hospitalization Program after reflecting on how participating in similar programs had been very helpful before.       Plan:  Start Partial Hospitalization Program ASAP    Diagnosis:    Bipolar  Disorder, current episode depressed Generalized Anxiety Disorder PTSD    Darrin LuisSolomon, Gerson Fauth A, LCSW 02/08/2017

## 2017-02-10 ENCOUNTER — Other Ambulatory Visit (HOSPITAL_COMMUNITY): Payer: 59 | Attending: Psychiatry | Admitting: Licensed Clinical Social Worker

## 2017-02-10 DIAGNOSIS — F314 Bipolar disorder, current episode depressed, severe, without psychotic features: Secondary | ICD-10-CM | POA: Diagnosis not present

## 2017-02-10 DIAGNOSIS — F331 Major depressive disorder, recurrent, moderate: Secondary | ICD-10-CM | POA: Insufficient documentation

## 2017-02-10 DIAGNOSIS — F4310 Post-traumatic stress disorder, unspecified: Secondary | ICD-10-CM | POA: Insufficient documentation
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
## 2017-02-11 ENCOUNTER — Other Ambulatory Visit (HOSPITAL_COMMUNITY): Payer: 59 | Admitting: Licensed Clinical Social Worker

## 2017-02-11 ENCOUNTER — Other Ambulatory Visit (HOSPITAL_COMMUNITY): Payer: 59 | Admitting: Specialist

## 2017-02-11 DIAGNOSIS — R4589 Other symptoms and signs involving emotional state: Secondary | ICD-10-CM

## 2017-02-11 DIAGNOSIS — F331 Major depressive disorder, recurrent, moderate: Secondary | ICD-10-CM

## 2017-02-11 DIAGNOSIS — F4310 Post-traumatic stress disorder, unspecified: Secondary | ICD-10-CM | POA: Diagnosis not present

## 2017-02-11 MED ORDER — LAMOTRIGINE 100 MG PO TABS: 100.0000 mg | ORAL_TABLET | Freq: Every day | ORAL | 1 refills | Status: DC

## 2017-02-11 MED ORDER — TRAZODONE HCL 100 MG PO TABS: 200.0000 mg | ORAL_TABLET | Freq: Every evening | ORAL | 1 refills | Status: DC | PRN

## 2017-02-11 MED ORDER — SERTRALINE HCL 50 MG PO TABS: 50.0000 mg | ORAL_TABLET | Freq: Every day | ORAL | 1 refills | Status: DC

## 2017-02-11 MED ORDER — TRAZODONE HCL 100 MG PO TABS: 200.0000 mg | ORAL_TABLET | Freq: Every day | ORAL | 1 refills | Status: DC

## 2017-02-11 NOTE — Therapy (Signed)
[facility] PARTIAL HOSPITALIZATION PROGRAM [address] [place], [place], [address] Phone: [phone]   Fax:  [phone]  Occupational Therapy Evaluation  Patient Details  Name: Brittany [name] MRN: [id number] Date of Birth: 05/18/1976 No Data Recorded  Encounter Date: 02/11/2017      OT End of Session - 02/11/17 2155    Visit Number 1   Number of Visits 6   Date for OT Re-Evaluation 03/04/17   Authorization Type [facility]   Authorization - Visit Number 1   Authorization - Number of Visits 6   OT Start Time 1045   OT Stop Time 1200   OT Time Calculation (min) 75 min      Past Medical History:  Diagnosis Date  . Anxiety   . Depression   . Diabetes mellitus without complication (HCC)   . HLD (hyperlipidemia) 01/02/2014  . Hypertension UNSURE    Past Surgical History:  Procedure Laterality Date  . TUBAL LIGATION      There were no vitals filed for this visit.      Subjective Assessment - 02/11/17 2154    Currently in Pain? No/denies      OT assessment Diagnosis  PTSD, Bipolar  Past medical history: HTN DM Living situation:  Lives with family ADLs indepdent w Work works at [employer] and T Leisure nothing Social support n/a doesn't rely on family Struggles with work OT goal Arts development officer for depression General Causality Orientation Scale   Subscore Percentile Score  Autonomy 61 74.5  Control 59 75.5  Impersonal 56 78.33   Motivation Type  Motivation type Explanation  o    o    o    o Mixed  The individual does not have a prominent motivational orientation.  Currently there is no clear theoretical explanation, and research shows this motivation type does not benefit from motivational interventions.   Patient participated in stress management group this date, learning definition, causes, symptoms, coping mechanisms.    Assessment:  Patient demonstrates mixed motivation type.  Patient will benefit from occupational  therapy intervention in order to improve time management, financial management, stress management, job readiness skills, social skills,sleep hygiene, exercise and healthy eating habits,  and health management skills and other psychosocial skills needed for preparation to return to full time community living and to be a productive community member.  Patient was engaged and particpated with group this date.  Plan:  Patient will participate in skilled occupational therapy sessions individually or in a group setting to improve coping skills, psychosocial skills, and emotional skills required to return to prior level of function as a productive community member. Treatment will be 1-2 times per week for 2-6 weeks.                             OT Education - 02/11/17 2154    Education Details stress management skills   Person(s) Educated Patient   Methods Explanation;Handout   Comprehension Verbalized understanding          OT Short Term Goals - 02/11/17 2157      OT SHORT TERM GOAL #1   Title Patient will be educated on strategies to improve psychosocial skills needed to participate fully in all daily, work, and leisure activities.   Time 3   Period Weeks   Status New     OT SHORT TERM GOAL #2   Title Patient will be educated on a  HEP and independent with implementation of HEP.   Time 3   Period Weeks   Status New     OT SHORT TERM GOAL #3   Title Patient will independently apply psychosocial skills and coping mechanisms to her daily activities in order to function independently.   Time 3   Period Weeks   Status New                  Plan - 02/11/17 2156    Rehab Potential Good   OT Frequency 2x / week   OT Duration --  3 weeks   OT Treatment/Interventions Self-care/ADL training  copking skills and community reintegation      Patient will benefit from skilled therapeutic intervention in order to improve the following deficits and impairments:    (coping skills, psychosocial skills)  Visit Diagnosis: Major depressive disorder, recurrent episode, moderate (HCC)  PTSD (post-traumatic stress disorder)  Difficulty coping    Problem List Patient Active Problem List   Diagnosis Date Noted  . Severe recurrent major depression without psychotic features (HCC) 01/23/2015    Class: Chronic  . PTSD (post-traumatic stress disorder) 01/04/2015  . Hepatic steatosis 05/16/2014  . Type 2 diabetes mellitus (HCC) 05/01/2014  . Nausea, vomiting, and diarrhea 01/05/2014  . HLD (hyperlipidemia) 01/02/2014  . Elevated liver function tests 01/02/2014  . Glucosuria 01/01/2014  . HTN (hypertension) 01/01/2014    Shirlean MylarBethany H. Adisson Deak, MHA, OTR/L [phone]  02/11/2017, 10:00 PM  [facility] PART[facility]PITAL[facility] PROGRAM [address] [place], [place], [address] Phone: [phone]   Fax:  [phone]  Name: Brittany [name] MRN: [id number] Date of Birth: 10/02/1975

## 2017-02-11 NOTE — Progress Notes (Signed)
Psychiatric Initial Adult Assessment   Patient Identification: Brittany [name] MRN:  [id number] Date of Evaluation:  02/11/17 Referral Source: therapist Chief Complaint:  PHP assessment, depression Visit Diagnosis:  1. Major depressive disorder, recurrent episode, moderate (HCC)   2. PTSD (post-traumatic stress disorder)    History of Present Illness:  Brittany [name] is a 42 year old female with a psychiatric history of bipolar disorder, major depressive disorder, PTSD, anxiety disorder who presents today for psychiatric assessment.  She is being assessed for necessity to engage in the partial hospitalization program.  The patient has a history of 3 prior psychiatric hospitalizations, and 2 prior suicide attempts. She is currently been experiencing worsening depression and suicidality in the context of work and financial stressors.  I spent time learning about the patient's social upbringing and her family history. I spent time learning about the relationship she has with her 41 year old and 42 year old son. She reports that she has a close relationship with her boyfriend of 2 years. The major source of stress in her life has been her career at [employer], as she is noted a significant increase in work stress, customer satisfaction responsibilities, and poor support for management. This, along with family stressors and stressors with her boyfriend, have contributed to worsening depression.  She participates in therapy on a regular basis with Dominic Pea.  She shares that she had intended to return to school many years ago, but in order to care for her son, she needed to continue working at [employer] to provide consistent income and health insurance. She reports that this turned into 7 years of ongoing work at [employer], and she reflects on her life and her career with disappointment. She had hoped to her nursing degree and be involved in healthcare herself, and worries if it's too late.  She struggles with  depressed mood, poor self confidence, irritability and anxiety, chronic thoughts about death and dying but denies any acute plan at this time. She continues to have trouble sleeping at night, with frequent awakenings and early morning awakenings. She does not engage in the activities and hobbies she tends to like.  She reports that her medications have not been particularly helpful; taking Lamictal 100 mg daily, Zoloft 25 mg daily, and trazodone 100 mg nightly.  I spent time learning about her diagnosis of bipolar, which was made in [place] about 10 years ago. I learned about the patient's hospitalization at that time in the setting of emotional stressors and trauma in her marriage. She does not present a history consistent with bipolar, and does not present any episodes consistent with hypomania or mania. I educated her that I believe this diagnosis of bipolar disorder is incorrect, and we will remove it from her problem list.  I expressed my impression that her presentation is most consistent with chronic PTSD and severe depression. We agreed to increase Zoloft to 50 mg daily, and increase to trazodone 200 mg nightly for ongoing sleep difficulties. We will maintain Lamictal at 100 mg daily for now.  I suggested to her that she should have an ongoing titration of Zoloft over the coming weeks as she establishes psychiatric care in this office.  She does feel that she can keep herself safe while she is engaged in the [facility] program and ongoing medication adjustment.  Associated Signs/Symptoms: Depression Symptoms:  depressed mood, anhedonia, insomnia, hypersomnia, psychomotor agitation, fatigue, feelings of worthlessness/guilt, difficulty concentrating, hopelessness, impaired memory, recurrent thoughts of death, anxiety, panic attacks, loss of energy/fatigue, weight gain, (Hypo) Manic Symptoms:  Irritable Mood, Anxiety Symptoms:  Excessive Worry, Social Anxiety, Psychotic Symptoms:   none PTSD Symptoms: Significant trauma history both emotional and physical, she does not wish to speak about it  Past Psychiatric History: Psychiatric history of 3 hospitalizations in 2003, 2005, and 2010. She has a previous suicide attempt by overdose  Previous Psychotropic Medications: Yes   Substance Abuse History in the last 12 months:  No.  Consequences of Substance Abuse: Negative  Past Medical History:  Past Medical History:  Diagnosis Date  . Anxiety   . Depression   . Diabetes mellitus without complication (HCC)   . HLD (hyperlipidemia) 01/02/2014  . Hypertension UNSURE    Family Psychiatric History: Family psychiatric history of depression and schizophrenia  Family History:  Family History  Problem Relation Age of Onset  . Diabetes Mother   . Hyperlipidemia Mother   . Diabetes Father   . Asthma Son   . Diabetes Paternal Grandmother   . Diabetes Paternal Grandfather   . Kidney disease Paternal Grandfather   . Schizophrenia Maternal Grandmother     Social History:   Social History   Social History  . Marital status: Single    Spouse name: N/A  . Number of children: N/A  . Years of education: N/A   Occupational History  . Not on file.   Social History Main Topics  . Smoking status: Never Smoker  . Smokeless tobacco: Never Used  . Alcohol use No  . Drug use: No  . Sexual activity: Not Currently    Birth control/ protection: None, Surgical   Other Topics Concern  . Not on file   Social History Narrative  . No narrative on file    Additional Social History: Currently works for [employer], has a 41 year old and 3 year old son. She is in a long-term relationship, her boyfriend lives in [place]  Allergies:   Allergies  Allergen Reactions  . Janumet Xr [Sitagliptin-Metformin Hcl Er]     diarrhea  . Metformin And Related     diarrhea  . Zoloft [Sertraline Hcl]     Headache, palpitations    Metabolic Disorder Labs: Lab Results  Component Value  Date   HGBA1C 9.1 12/22/2016     Chemistry      Component Value Date/Time   NA 142 09/11/2016 0900   K 4.2 09/11/2016 0900   CL 109 09/11/2016 0900   CO2 23 09/11/2016 0900   BUN 9 09/11/2016 0900   CREATININE 0.69 09/11/2016 0900      Component Value Date/Time   CALCIUM 9.0 09/11/2016 0900   ALKPHOS 60 09/11/2016 0900   AST 26 09/11/2016 0900   ALT 50 (H) 09/11/2016 0900   BILITOT 0.3 09/11/2016 0900      Lab Results  Component Value Date   NA 142 09/11/2016   BUN 9 09/11/2016   CREATININE 0.69 09/11/2016   TSH 1.50 11/07/2015   WBC 10.1 11/07/2015   Lab Results  Component Value Date   WBC 10.1 11/07/2015   HGB 13.3 11/07/2015   HCT 43.0 11/07/2015   MCV 74.9 (L) 11/07/2015   PLT 277 11/07/2015   Lab Results  Component Value Date   CHOL 194 11/07/2015   CHOL 186 05/16/2014   CHOL 220 (H) 01/01/2014   Lab Results  Component Value Date   HDL 49 11/07/2015   HDL 55 05/16/2014   HDL 57.00 01/01/2014   Lab Results  Component Value Date   LDLCALC 100 11/07/2015   LDLCALC 109 (H) 05/16/2014  LDLCALC 136 (H) 01/01/2014   Lab Results  Component Value Date   TRIG 226 (H) 11/07/2015   TRIG 109 05/16/2014   TRIG 135.0 01/01/2014   Lab Results  Component Value Date   CHOLHDL 4.0 11/07/2015   CHOLHDL 3.4 05/16/2014   CHOLHDL 4 01/01/2014   No results found for: LDLDIRECT  Current Medications:  Current Outpatient Prescriptions:  .  AMBULATORY NON FORMULARY MEDICATION, One Touch Ultra test strips, Disp: 100 each, Rfl: 1 .  dapagliflozin propanediol (FARXIGA) 10 MG TABS tablet, Take 10 mg by mouth daily., Disp: 30 tablet, Rfl: 2 .  glipiZIDE (GLUCOTROL XL) 10 MG 24 hr tablet, Take 1 tablet (10 mg total) by mouth daily with breakfast., Disp: 30 tablet, Rfl: 2 .  lamoTRIgine (LAMICTAL) 100 MG tablet, Take 1 tablet (100 mg total) by mouth daily., Disp: 30 tablet, Rfl: 1 .  Lancets (ONETOUCH ULTRASOFT) lancets, Use as instructed, Disp: 100 each, Rfl: 12 .   liraglutide (VICTOZA) 18 MG/3ML SOPN, Inject 0.3 mLs (1.8 mg total) into the skin every morning. Please include needles., Disp: 9 mL, Rfl: 2 .  lisinopril (PRINIVIL,ZESTRIL) 5 MG tablet, Take 1 tablet (5 mg total) by mouth daily., Disp: 30 tablet, Rfl: 2 .  metoprolol succinate (TOPROL-XL) 50 MG 24 hr tablet, Take 1 tablet (50 mg total) by mouth daily., Disp: 90 tablet, Rfl: 0 .  sertraline (ZOLOFT) 50 MG tablet, Take 1 tablet (50 mg total) by mouth daily., Disp: 30 tablet, Rfl: 1 .  traZODone (DESYREL) 100 MG tablet, Take 2 tablets (200 mg total) by mouth at bedtime., Disp: 60 tablet, Rfl: 1  Neurologic: Headache: Negative Seizure: Negative Paresthesias:Negative  Musculoskeletal: Strength & Muscle Tone: within normal limits Gait & Station: normal Patient leans: N/A  Psychiatric Specialty Exam: ROS  There were no vitals filed for this visit.   General Appearance: Casual and Well Groomed  Eye Contact:  Good  Speech:  Clear and Coherent  Volume:  Normal  Mood:  Anxious and Depressed  Affect:  Congruent and Depressed  Thought Process:  Goal Directed  Orientation:  Full (Time, Place, and Person)  Thought Content:  Logical and Rumination  Suicidal Thoughts:  Yes.  without intent/plan  Homicidal Thoughts:  No  Memory:  Immediate;   Fair  Judgement:  Fair  Insight:  Fair  Psychomotor Activity:  Normal  Concentration:  Concentration: Fair  Recall:  NA  Fund of Knowledge:Fair  Language: Fair  Akathisia:  Negative  Handed:  Right  AIMS (if indicated):  0  Assets:  Communication Skills Desire for Improvement Financial Resources/Insurance Housing Transportation Vocational/Educational  ADL's:  Intact  Cognition: WNL  Sleep:  4-5 hours nightly    Treatment Plan Summary: Brittany [name] is a 41 year old female with a psychiatric history of depression and PTSD. I have a low suspicion of bipolar disorder. She has biological contributors of family history of depression and  schizophrenia. She has a prior history of attempted suicide by overdose, and 3 prior psychiatric hospitalizations. She has experienced worsening depression in the setting of work and social stressors, and would benefit from a titration of medications while engaging in the partial hospitalization program. I believe that [facility] will help preclude the necessity for an acute psychiatric hospitalization, and assist the patient in being able to return to less intensive outpatient therapies. She presents with fairly good insight into how her job stressors are contributing to her mood, but her depressive symptoms make it difficult for her to  be able to navigate her way through the stressors. I'm hopeful that she will respond well to coping strategies and insight building, along with an adjustment of her psychiatric medications.    1. Major depressive disorder, recurrent episode, moderate (HCC)   2. PTSD (post-traumatic stress disorder)    Patient is an appropriate candidate for PHP to prevent acute psychiatric hospitalization Increase Zoloft to 50 mg daily Increase trazodone to 200 mg nightly Continue Lamictal 100 mg daily Patient is scheduled for psychiatric follow-up in this office in the coming weeks, and will continue to receive medication management for now through the partial hospitalization program  Burnard Leigh, MD [facility]

## 2017-02-11 NOTE — Psych (Signed)
   [facility]  Brittany BanningLakisha Vaux [id number]  Session Time: 9-2  Participation Level: Active  Behavioral Response: CasualAlertDepressed  Type of Therapy: Group Therapy  Treatment Goals addressed: Coping  Interventions: CBT, DBT and Supportive  Summary:  9:00 - 10:30 Clinician led check-in regarding current stressors and situation, and review of patient completed daily inventory. Clinician utilized active listening and empathetic response and validated patient emotions. Clinician facilitated processing group on pertinent issues.  10:30 -11:30: OT Group 11:30 - 12:45 Clinician led psychotherapy group on how patients deal with symptoms of depression and anxiety and healthier alternatives. 12:45 - 1:50 Clinician introduced topic of "Cognitive Distortions". Patients identified cognitive distortions they often have and ways to combat these distortions. 1:50 - 2:00 Clinician led check-out. Clinician assessed for immediate needs, medication compliance and efficacy, and safety concerns.    Suicidal/Homicidal: Nowithout intent/plan  Therapist Response: Brittany BanningLakisha Yarbough is a 41 y.o. female who presents with depression and anxiety symptoms. Patient arrived within time allowed and reports she is feeling "extra depressed." Patient rates her mood at a 4?on a 1- 10 scale with 10 being great. Patient engaged in activity and discussion. Patient discussed isolation, stigma related to mental health, and sleep issues. Patient has not demonstrated progress due to today being her first day in PHP. Patient denies SI/HI/self-harm thoughts at end of session.    Plan: Patient will continue in PHP and medication management while working on increasing mood stabilization.   Diagnosis: Major depressive disorder, recurrent episode, moderate (HCC) [F33.1]    1. Major depressive disorder, recurrent episode, moderate (HCC)   2. PTSD (post-traumatic stress disorder)    Quinn AxeWhitney J Febe Champa,  LPCA 02/11/2017

## 2017-02-12 ENCOUNTER — Other Ambulatory Visit (HOSPITAL_COMMUNITY): Payer: Self-pay

## 2017-02-15 ENCOUNTER — Other Ambulatory Visit (HOSPITAL_COMMUNITY): Payer: Self-pay

## 2017-02-15 ENCOUNTER — Other Ambulatory Visit: Payer: Self-pay | Admitting: Physician Assistant

## 2017-02-15 DIAGNOSIS — E119 Type 2 diabetes mellitus without complications: Secondary | ICD-10-CM

## 2017-02-16 ENCOUNTER — Other Ambulatory Visit (HOSPITAL_COMMUNITY): Payer: 59 | Admitting: Occupational Therapy

## 2017-02-16 ENCOUNTER — Other Ambulatory Visit (HOSPITAL_COMMUNITY): Payer: 59 | Admitting: Licensed Clinical Social Worker

## 2017-02-16 DIAGNOSIS — F331 Major depressive disorder, recurrent, moderate: Secondary | ICD-10-CM

## 2017-02-16 NOTE — Therapy (Signed)
[facility] PARTIAL HOSPITALIZATION PROGRAM [address] [place], [place], [address] Phone: [phone]   Fax:  [phone]  Occupational Therapy Treatment  Patient Details  Name: Aylee Littrell MRN: [id number] Date of Birth: [birth year] No Data Recorded  Encounter Date: 02/16/2017      OT End of Session - 02/16/17 1231    Visit Number 2   Number of Visits 6   Date for OT Re-Evaluation 03/04/17   Authorization Type [facility]   Authorization - Visit Number 2   Authorization - Number of Visits 6   OT Start Time 1030   OT Stop Time 1135   OT Time Calculation (min) 65 min   Activity Tolerance Patient tolerated treatment well   Behavior During Therapy [facility] for tasks assessed/performed      Past Medical History:  Diagnosis Date  . Anxiety   . Depression   . Diabetes mellitus without complication (HCC)   . HLD (hyperlipidemia) 01/02/2014  . Hypertension UNSURE    Past Surgical History:  Procedure Laterality Date  . TUBAL LIGATION      There were no vitals filed for this visit.      Subjective Assessment - 02/16/17 1231    Currently in Pain? No/denies        OT Treatment Session: Exercise   S:  "I used to walk but I'm not walking right now." O:  Patient actively participated in the following skilled occupational therapy group this date:  Exercise: Discussed strategies to begin an exercises program, contraindications to exercise, and tips for incorporating exercise into daily routine. Pt participated in leading group in stretching and exercise activity, as well as brainstorming how to adapt exercises when necessary. Pt participated in exercise "scattegories" game focusing on exercise, stress reduction, and health benefits of exercise. Pt participated in group discussions and activities throughout session.  A:  Patient participated in skilled occupational therapy group for exercise skills this date.  Patient was engaged and appears open  to strategies introduced.  Patient is not actively exercising at this time, is interested in beginning an exercise routine.  Pt provided with tips for incorporating exercises into routine, at home workouts, benefits of exercise, and contraindications for exercise handouts. Pt provided with chart to begin documenting daily exercise-intentional exercise or non-intentional.    P:  Continue participation in skilled occupational therapy groups 1-2 times per week for 2 weeks in order to gain the necessary skills needed to return to full time community living and learn effective coping strategies to be a productive community resident.                   OT Short Term Goals - 02/16/17 1231      OT SHORT TERM GOAL #1   Title Patient will be educated on strategies to improve psychosocial skills needed to participate fully in all daily, work, and leisure activities.   Time 3   Period Weeks   Status On-going     OT SHORT TERM GOAL #2   Title Patient will be educated on a HEP and independent with implementation of HEP.   Time 3   Period Weeks   Status On-going     OT SHORT TERM GOAL #3   Title Patient will independently apply psychosocial skills and coping mechanisms to her daily activities in order to function independently.   Time 3   Period Weeks   Status On-going  Plan - 02/16/17 1231    Rehab Potential Good   OT Frequency 2x / week   OT Duration --  3 weeks   OT Treatment/Interventions Self-care/ADL training  copking skills and community reintegation   Consulted and Agree with Plan of Care Patient      Patient will benefit from skilled therapeutic intervention in order to improve the following deficits and impairments:   (coping skills, psychosocial skills)  Visit Diagnosis: Major depressive disorder, recurrent episode, moderate (HCC)    Problem List Patient Active Problem List   Diagnosis Date Noted  . Severe recurrent major depression  without psychotic features (HCC) 01/23/2015    Class: Chronic  . PTSD (post-traumatic stress disorder) 01/04/2015  . Hepatic steatosis 05/16/2014  . Type 2 diabetes mellitus (HCC) 05/01/2014  . Nausea, vomiting, and diarrhea 01/05/2014  . HLD (hyperlipidemia) 01/02/2014  . Elevated liver function tests 01/02/2014  . Glucosuria 01/01/2014  . HTN (hypertension) 01/01/2014   Ezra SitesLeslie Troxler, OTR/L  [phone] 02/16/2017, 12:32 PM  [facility] [address] [place], [place], [address] Phone: [phone]   Fax:  [phone]  Name: Roanna BanningLakisha Gadsden MRN: [id number] Date of Birth: 12/27/1975

## 2017-02-16 NOTE — Psych (Signed)
   [facility] THERAPIST [facility]ESS [facility]  Brittany BanningLakisha [name] [id number]  Session Time: 9-2  Participation Level: Active  Behavioral Response: CasualAlertDepressed  Type of Therapy: Group Therapy  Treatment Goals addressed: Coping  Interventions: CBT, DBT and Supportive  Summary:  9:00 - 10:30 Clinician led check-in regarding current stressors and situation, and review of patient completed daily inventory. Clinician utilized active listening and empathetic response and validated patient emotions. Clinician facilitated processing group on pertinent issues.  10:30 -11:30: OT Group 11:30 - 12:45 Clinician led psychotherapy group on how societal pressure and stigma impacts mental health.  12:45 - 1:50 Clinician introduced topic of "Boundaries". Patients discussed the difference between rigid, porous, and healthy boundaries. Patients identified different areas in life where boundaries need to be addressed. 1:50 - 2:00 Clinician led check-out. Clinician assessed for immediate needs, medication compliance and efficacy, and safety concerns.    Suicidal/Homicidal: Nowithout intent/plan  Therapist Response: Brittany BanningLakisha Twist is a 41 y.o. female who presents with depression and anxiety symptoms. Patient arrived within time allowed and reports she is feeling "emotional." Patient rates her mood at a 5 on a 1- 10 scale with 10 being great. Patient engaged in activity and discussion. Patient disclosed experiencing a miscarriage in the fall and thinking about it more recently. Patient demonstrates some progress as evidenced by willingness to discuss emotional issues. Patient denies SI/HI/self-harm thoughts at end of session.    Plan: Patient will continue in PHP and medication management while working on increasing mood stabilization.   Diagnosis: Major depressive disorder, recurrent episode, moderate (HCC) [F33.1]    1. Major depressive disorder, recurrent episode, moderate (HCC)    Donia GuilesJenny Elmore Hyslop,  LCSW 02/16/2017

## 2017-02-17 ENCOUNTER — Other Ambulatory Visit: Payer: Self-pay | Admitting: *Deleted

## 2017-02-17 ENCOUNTER — Other Ambulatory Visit (HOSPITAL_COMMUNITY): Payer: Self-pay

## 2017-02-17 DIAGNOSIS — E118 Type 2 diabetes mellitus with unspecified complications: Principal | ICD-10-CM

## 2017-02-17 DIAGNOSIS — E1165 Type 2 diabetes mellitus with hyperglycemia: Secondary | ICD-10-CM

## 2017-02-17 DIAGNOSIS — IMO0002 Reserved for concepts with insufficient information to code with codable children: Secondary | ICD-10-CM

## 2017-02-17 MED ORDER — LISINOPRIL 5 MG PO TABS: 5.0000 mg | ORAL_TABLET | Freq: Every day | ORAL | 0 refills | Status: DC

## 2017-02-18 ENCOUNTER — Other Ambulatory Visit (HOSPITAL_COMMUNITY): Payer: 59 | Admitting: Specialist

## 2017-02-18 ENCOUNTER — Other Ambulatory Visit (HOSPITAL_COMMUNITY): Payer: 59 | Admitting: Licensed Clinical Social Worker

## 2017-02-18 DIAGNOSIS — R4589 Other symptoms and signs involving emotional state: Secondary | ICD-10-CM

## 2017-02-18 DIAGNOSIS — F332 Major depressive disorder, recurrent severe without psychotic features: Secondary | ICD-10-CM

## 2017-02-18 DIAGNOSIS — F331 Major depressive disorder, recurrent, moderate: Secondary | ICD-10-CM | POA: Diagnosis not present

## 2017-02-18 NOTE — Progress Notes (Signed)
Progress note  Ms Brittany [name] remains stressed out and depressed.  Was planning on quitting her job as she just cannot return without becoming suicidal she says.  Hates having to be in treatment all the time but just cannot get back to her old self.   Plan:  Increase the sertraline to 100 mg next week, continue trazodone at 200 mg hs.  Continue Lamictal 100 mg for now as it may be helping and does not seem to be hurting.  I agree with the dropping of the bipolar disorder diagnosis as there is no history of discrete manic episodes and the history and presentation is more consistent with major depression and PTSD

## 2017-02-18 NOTE — Therapy (Signed)
[facility] PARTIAL HOSPITALIZATION PROGRAM [address] [place], [place], [address] Phone: [phone]   Fax:  [phone]  Occupational Therapy Treatment  Patient Details  Name: Brittany [name] MRN: [id number] Date of Birth: August 23, 1976 No Data Recorded  Encounter Date: 02/18/2017      OT End of Session - 02/18/17 2253    Visit Number 3   Number of Visits 6   Date for OT Re-Evaluation 03/04/17   Authorization Type [facility]   Authorization - Visit Number 3   Authorization - Number of Visits 6   OT Start Time 1030   OT Stop Time 1130   OT Time Calculation (min) 60 min      Past Medical History:  Diagnosis Date  . Anxiety   . Depression   . Diabetes mellitus without complication (HCC)   . HLD (hyperlipidemia) 01/02/2014  . Hypertension UNSURE    Past Surgical History:  Procedure Laterality Date  . TUBAL LIGATION      There were no vitals filed for this visit.      Subjective Assessment - 02/18/17 2253    Currently in Pain? No/denies            S:  Work, family, and job are stressful to me.  I like to color it relaxes me O:  Patient participated in stress management group this date.  Group explored causes of stress, definition of stress, affects stress has on body physically, mentally, behaviorally. Group discussed good stress vs bad stress and why we may have more stress in various times of our life.  Patient created an avoidance hierarchy of places/times that cause stress in their life, labeling from lowest to highest stress.  Each instance was discussed and labeled external or internal stressors.  Group discussed why we internalize occurances and cause more stress to ourselves.  Group then discussed how to cope with stress using all 5 body senses.  After learning new coping strategies, patient identified 4 key coping strategies they would begin utilizing and wrote them on paper keys that they could carry with them on key  rings. A:  Patient participated in group minimally.  Required min-mod cuing to comment on conversation topics. P:  Follow up on integration of new coping strategies.   Financial management group.                   OT Education - 02/18/17 2253    Education Details coping with stress using 5  senses   Person(s) Educated Patient   Methods Explanation;Handout   Comprehension Verbalized understanding          OT Short Term Goals - 02/16/17 1231      OT SHORT TERM GOAL #1   Title Patient will be educated on strategies to improve psychosocial skills needed to participate fully in all daily, work, and leisure activities.   Time 3   Period Weeks   Status On-going     OT SHORT TERM GOAL #2   Title Patient will be educated on a HEP and independent with implementation of HEP.   Time 3   Period Weeks   Status On-going     OT SHORT TERM GOAL #3   Title Patient will independently apply psychosocial skills and coping mechanisms to her daily activities in order to function independently.   Time 3   Period Weeks   Status On-going  Patient will benefit from skilled therapeutic intervention in order to improve the following deficits and impairments:   (decreased coping and pyschosocial skills)  Visit Diagnosis: Severe recurrent major depression without psychotic features (HCC)  Difficulty coping    Problem List Patient Active Problem List   Diagnosis Date Noted  . Severe recurrent major depression without psychotic features (HCC) 01/23/2015    Class: Chronic  . PTSD (post-traumatic stress disorder) 01/04/2015  . Hepatic steatosis 05/16/2014  . Type 2 diabetes mellitus (HCC) 05/01/2014  . Nausea, vomiting, and diarrhea 01/05/2014  . HLD (hyperlipidemia) 01/02/2014  . Elevated liver function tests 01/02/2014  . Glucosuria 01/01/2014  . HTN (hypertension) 01/01/2014    Shirlean MylarBethany H. Shamell Suarez, MHA, OTR/L [phone]  02/18/2017, 10:54  PM  [facility] PART[facility] PROGRAM [address] [place], [place], [address] Phone: [phone]   Fax:  [phone]  Name: Brittany [name] MRN: [id number] Date of Birth: 04/19/1976

## 2017-02-19 ENCOUNTER — Other Ambulatory Visit (HOSPITAL_COMMUNITY): Payer: PRIVATE HEALTH INSURANCE | Attending: Psychiatry | Admitting: Licensed Clinical Social Worker

## 2017-02-19 DIAGNOSIS — Z818 Family history of other mental and behavioral disorders: Secondary | ICD-10-CM | POA: Insufficient documentation

## 2017-02-19 DIAGNOSIS — F332 Major depressive disorder, recurrent severe without psychotic features: Secondary | ICD-10-CM

## 2017-02-19 DIAGNOSIS — R45851 Suicidal ideations: Secondary | ICD-10-CM | POA: Insufficient documentation

## 2017-02-19 DIAGNOSIS — G4700 Insomnia, unspecified: Secondary | ICD-10-CM | POA: Diagnosis not present

## 2017-02-19 DIAGNOSIS — F4310 Post-traumatic stress disorder, unspecified: Secondary | ICD-10-CM | POA: Diagnosis not present

## 2017-02-19 DIAGNOSIS — Z841 Family history of disorders of kidney and ureter: Secondary | ICD-10-CM | POA: Insufficient documentation

## 2017-02-19 DIAGNOSIS — E785 Hyperlipidemia, unspecified: Secondary | ICD-10-CM | POA: Insufficient documentation

## 2017-02-19 DIAGNOSIS — Z9889 Other specified postprocedural states: Secondary | ICD-10-CM | POA: Diagnosis not present

## 2017-02-19 DIAGNOSIS — Z833 Family history of diabetes mellitus: Secondary | ICD-10-CM | POA: Insufficient documentation

## 2017-02-19 DIAGNOSIS — F331 Major depressive disorder, recurrent, moderate: Secondary | ICD-10-CM | POA: Insufficient documentation

## 2017-02-19 DIAGNOSIS — E119 Type 2 diabetes mellitus without complications: Secondary | ICD-10-CM | POA: Diagnosis not present

## 2017-02-19 DIAGNOSIS — Z825 Family history of asthma and other chronic lower respiratory diseases: Secondary | ICD-10-CM | POA: Insufficient documentation

## 2017-02-19 DIAGNOSIS — R42 Dizziness and giddiness: Secondary | ICD-10-CM | POA: Insufficient documentation

## 2017-02-19 DIAGNOSIS — Z79899 Other long term (current) drug therapy: Secondary | ICD-10-CM | POA: Diagnosis not present

## 2017-02-19 DIAGNOSIS — Z8249 Family history of ischemic heart disease and other diseases of the circulatory system: Secondary | ICD-10-CM | POA: Diagnosis not present

## 2017-02-19 DIAGNOSIS — Z888 Allergy status to other drugs, medicaments and biological substances status: Secondary | ICD-10-CM | POA: Diagnosis not present

## 2017-02-19 DIAGNOSIS — I10 Essential (primary) hypertension: Secondary | ICD-10-CM | POA: Insufficient documentation

## 2017-02-19 DIAGNOSIS — Z7984 Long term (current) use of oral hypoglycemic drugs: Secondary | ICD-10-CM | POA: Insufficient documentation

## 2017-02-19 NOTE — Psych (Signed)
   [facility]  Brittany BanningLakisha [name] [id number]  Session Time: 9-2  Participation Level: Active  Behavioral Response: CasualAlertDepressed  Type of Therapy: Group Therapy  Treatment Goals addressed: Coping  Interventions: CBT, DBT and Supportive  Summary:  9:00 - 10:30 Clinician led check-in regarding current stressors and situation, and review of patient completed daily inventory. Clinician utilized active listening and empathetic response and validated patient emotions. Clinician facilitated processing group on pertinent issues.  10:30 -11:30: OT Group 11:30 - 12:45 Clinician led psychotherapy group on interpersonal issues. 12:45 - 1:50 Clinician introduced topic of "Communication". Clinician explained the difference between communication styles and healthy/unhealthy communication. Patients identified their main form of communication and reasons why it works, or reasons to think about changing it. 1:50 - 2:00 Clinician led check-out. Clinician assessed for immediate needs, medication compliance and efficacy, and safety concerns.    Suicidal/Homicidal: Nowithout intent/plan  Therapist Response: Brittany BanningLakisha Alanis is a 41 y.o. female who presents with depression and anxiety symptoms. Patient arrived within time allowed and reports she is feeling "emotional." Patient rates her mood at a 5.5 on a 1- 10 scale with 10 being great. Patient engaged in activity and discussion. Patient shared thinking about making some big changes with her boyfriend. Patient demonstrates some progress as evidenced by willingness to discuss emotional issues. Patient denies SI/HI/self-harm thoughts at end of session.    Plan: Patient will continue in PHP and medication management while working on increasing mood stabilization.   Diagnosis: Severe recurrent major depression without psychotic features (HCC) [F33.2]    1. Severe recurrent major depression without psychotic features (HCC)     Donia GuilesJenny Xyla Leisner, LCSW 02/19/2017

## 2017-02-19 NOTE — Psych (Signed)
   [facility]  Brittany [name] [id number]  Session Time: 9-1  Participation Level: Minimal  Behavioral Response: CasualAlertDepressed  Type of Therapy: Group Therapy  Treatment Goals addressed: Coping  Interventions: CBT, DBT, Supportive and Reframing  Summary:  9:00 - 10:00 Clinician led check-in regarding current stressors and situation, and review of patient completed daily inventory. Clinician utilized active listening and empathetic response and validated patient emotions. Clinician facilitated processing group on pertinent issues.  10:00 -11:00: Clinician led psychotherapy group on stigma related to mental illness, and how to cope with thoughts of suicide. 11:00 - 12:00: Clinician pulled patient for one-on-one session. 12:00 - 12:50: Clinician introduced topic of "fair fighting rules" for arguments. Patients identified which rules they already follow, and which rules they need to be more aware of for better communication in the future. 12:50 - 1:00 Clinician led check-out. Clinician assessed for immediate needs, medication compliance and efficacy, and safety concerns.  Suicidal/Homicidal: Yeswithout intent/plan  Therapist Response: Brittany BanningLakisha Furey is a 41 y.o. female who presents with depression and anxiety symptoms. Patient arrived within time allowed and reports she is feeling "really low." Patient rates her mood at a 0 on a 1- 10 scale with 10 being great. Patient did not engaged in early activity and discussions, but participated more towards the end of group. Counselor pulled patient for individual session to discuss problems due to patient not wanting to share in group.  Patient shared feeling hurt by her boyfriend and blamed for miscarriage.  - See individual note for more. Patient denies SI/HI/self-harm thoughts at end of session.    Plan: Patient will continue in PHP and medication management while working on decreasing depression symptoms and  increasing coping skills.  Diagnosis: Severe recurrent major depression without psychotic features (HCC) [F33.2]    1. Severe recurrent major depression without psychotic features (HCC)    Ashiyah Pavlak J Hazen Brumett, LPCA 02/19/2017

## 2017-02-19 NOTE — Psych (Signed)
Cln met with patient in individual as component of PHP. Cln checked in with pt regarding reported SI and current stressors. Cln worked with patient to create safety plan and goals that will lessen stressors. Pt reports she got into an argument with her boyfriend last night about having children. Pt states that during the disagreement she felt invalidated and that her boyfriend blamed her for her miscarriage. Pt shares that it is difficult to trust people however she has grown close to her boyfriend and views him as her main support. Pt states she feels as if she cannot trust him now and that she is alone.  Pt is able to de-escalate through discussion with cln. Pt identified supports she has other than her boyfriend and is able to recognize that things may change. Pt planned what she can do over the weekend to keep herself distracted and identified visiting her mom, calling a friend, and talking to her son as supports.  Pt also shares that she is stressed about her job and returning to work as she feels she is likely to become suicidal should she go back to work. Pt is able to share her concerns with getting another job and problem solve some possible solutions. Pt states desire to go to [employer] after group and begin work on some of these solutions.  Pt denies SI/HI at end of individual session and reports feeling safe over the weekend. Pt is educated on crisis services and states she will call the crisis line or walk into [facility] should she feels unsafe.

## 2017-02-22 ENCOUNTER — Other Ambulatory Visit (HOSPITAL_COMMUNITY): Payer: PRIVATE HEALTH INSURANCE | Admitting: Licensed Clinical Social Worker

## 2017-02-22 ENCOUNTER — Other Ambulatory Visit: Payer: Self-pay | Admitting: *Deleted

## 2017-02-22 DIAGNOSIS — IMO0002 Reserved for concepts with insufficient information to code with codable children: Secondary | ICD-10-CM

## 2017-02-22 DIAGNOSIS — F331 Major depressive disorder, recurrent, moderate: Secondary | ICD-10-CM | POA: Diagnosis not present

## 2017-02-22 DIAGNOSIS — E118 Type 2 diabetes mellitus with unspecified complications: Principal | ICD-10-CM

## 2017-02-22 DIAGNOSIS — E1165 Type 2 diabetes mellitus with hyperglycemia: Secondary | ICD-10-CM

## 2017-02-22 DIAGNOSIS — F4310 Post-traumatic stress disorder, unspecified: Secondary | ICD-10-CM

## 2017-02-22 DIAGNOSIS — F332 Major depressive disorder, recurrent severe without psychotic features: Secondary | ICD-10-CM

## 2017-02-22 MED ORDER — GLIPIZIDE ER 10 MG PO TB24: 10.0000 mg | ORAL_TABLET | Freq: Every day | ORAL | 0 refills | Status: DC

## 2017-02-23 ENCOUNTER — Other Ambulatory Visit (HOSPITAL_COMMUNITY): Payer: PRIVATE HEALTH INSURANCE | Admitting: Occupational Therapy

## 2017-02-23 ENCOUNTER — Other Ambulatory Visit (HOSPITAL_COMMUNITY): Payer: PRIVATE HEALTH INSURANCE | Admitting: Licensed Clinical Social Worker

## 2017-02-23 DIAGNOSIS — F332 Major depressive disorder, recurrent severe without psychotic features: Secondary | ICD-10-CM

## 2017-02-23 DIAGNOSIS — F331 Major depressive disorder, recurrent, moderate: Secondary | ICD-10-CM | POA: Diagnosis not present

## 2017-02-23 NOTE — Psych (Signed)
   [facility]  Brittany BanningLakisha [name] [id number]  Session Time: 9-2  Participation Level: Active  Behavioral Response: CasualAlertDepressed  Type of Therapy: Group Therapy  Treatment Goals addressed: Coping  Interventions: CBT, DBT and Supportive  Summary:  9:00 - 10:30: Pharmacist discussed medication with group and answered questions.  10:30 -11:30: Clinician led check-in regarding current stressors and situation, and review of patient completed daily inventory. Clinician utilized active listening and empathetic response and validated patient emotions. Clinician facilitated processing group on pertinent issues.  11:30 - 12:45 Clinician led psychotherapy group on managing expectations of self and others. 12:45 - 1:50 Clinician introduced topic of "Distress Tolerance". Clinician discussed "TIPS", "ACCEPTS", and "STOP" and how/when patients can employ these methods to help. Patients identified when these techniques may be helpful in their personal lives. 1:50 - 2:00 Clinician led check-out. Clinician assessed for immediate needs, medication compliance and efficacy, and safety concerns.     Suicidal/Homicidal: Nowithout intent/plan  Therapist Response: Brittany BanningLakisha Boldman is a 41 y.o. female who presents with depression and anxiety symptoms. Patient arrived within time allowed and reports she is feeling "okay." Patient rates her mood at a 4.5 on a 1- 10 scale with 10 being great. Patient engaged in activity and discussion. Patient shared continued conflict regarding interpersonal conflict. Patient demonstrates some progress as evidenced by identifying the way trust plays into her current stress. Patient denies SI/HI/self-harm thoughts at end of session.    Plan: Patient will continue in PHP and medication management while working on increasing mood stabilization.   Diagnosis: Severe recurrent major depression without psychotic features (HCC) [F33.2]    1. Severe  recurrent major depression without psychotic features (HCC)   2. PTSD (post-traumatic stress disorder)    Donia GuilesJenny Evalynn Hankins, LCSW 02/23/2017

## 2017-02-23 NOTE — Psych (Signed)
   [facility]  Brittany [name] [id number]  Session Time: 9-2  Participation Level: Minimal  Behavioral Response: CasualAlertDepressed  Type of Therapy: Group Therapy  Treatment Goals addressed: Coping  Interventions: CBT, DBT, Supportive and Reframing  Summary:  9:00 - 10:30 Clinician led check-in regarding current stressors and situation, and review of patient completed daily inventory. Clinician utilized active listening and empathetic response and validated patient emotions. Clinician facilitated processing group on pertinent issues.  10:30 -11:30: OT Group 11:30 - 12:45 Clinician led psychotherapy group on adult issues and how to manage. 12:45 - 1:50 Clinician continued topic of "Distress Tolerance". Clinician finished discussing "ACCEPTS" and discussed self-soothing and how/when patients can employ these methods to help. Patients identified when these techniques may be helpful in their personal lives. 1:50 - 2:00 Clinician led check-out. Clinician assessed for immediate needs, medication compliance and efficacy, and safety concerns.  Suicidal/Homicidal: Nowithout intent/plan  Therapist Response: Brittany [name] is a 41 y.o. female who presents with depression and anxiety symptoms. Patient arrived within time allowed and reports she is feeling "anxious." Patient rates her mood at a 3 on a 1- 10 scale with 10 being great. Patient engaged in activity and discussion. Patient discussed her anxiety related to a counseling meeting she will have with her boyfriend in the afternoon.  Counselor pulled patient for individual session to discuss anxiety related to afternoon meeting with boyfriend and discharge plan.  -See individual note for more. Patient denies SI/HI/self-harm thoughts at end of session.    Plan: Patient will continue in PHP and medication management while working on decreasing depression symptoms and increasing coping skills.  Diagnosis: Severe  recurrent major depression without psychotic features (HCC) [F33.2]    1. Severe recurrent major depression without psychotic features (HCC)    [name] J Lindberg Zenon, LPCA 02/23/2017

## 2017-02-23 NOTE — Psych (Signed)
Cln met with Brittany [name] in individual session as part of PHP. Cln discussed progress, current concerns, and discharge planning. Brittany [name] states she does not feel ready to leave PHP. Cln discussed extending discharge date by 2 days, and Brittany [name] agreed. Brittany [name] wants to step down to IOP and then return to her regular therapist. Brittany [name] has an appointment with a psychiatrist on 7/17 already. Brittany [name] discussed anxiety related to meeting with her boyfriend and minister this afternoon. Cln helped Brittany [name] to reframe some thinking to reduce anxiety.   Mirah Nevins J. Martinique Pizzimenti, LPCA 02/23/17

## 2017-02-23 NOTE — Therapy (Signed)
[facility] PART[facility] HOSPITAL[facility] PROGRAM [address] [place], [place], [address] Phone: [phone]   Fax:  [phone]  Occupational Therapy Treatment  Patient Details  Name: Roanna BanningLakisha Bruni MRN: [id number] Date of Birth: 01/03/1976 No Data Recorded  Encounter Date: 02/23/2017      OT End of Session - 02/23/17 2112    Visit Number 4   Number of Visits 6   Date for OT Re-Evaluation 03/04/17   [facility] Type [facility]   Authorization - Visit Number 4   Authorization - Number of Visits 6   OT Start Time 1030   OT Stop Time 1135   OT Time Calculation (min) 65 min      Past Medical History:  Diagnosis Date  . Anxiety   . Depression   . Diabetes mellitus without complication (HCC)   . HLD (hyperlipidemia) 01/02/2014  . Hypertension UNSURE    Past Surgical History:  Procedure Laterality Date  . TUBAL LIGATION      There were no vitals filed for this visit.      Subjective Assessment - 02/23/17 2112    Currently in Pain? No/denies        OT Group: Financial Management  S: "This is hard but the scenario is realistic." O:  Patient actively participated in the following skilled occupational therapy treatment session this date:             Financial management-Discussed the importance of good financial management in all  areas of life and importance of saving. Pt participated in group discussion regarding current methods of budgeting and if these are effective or ineffective. She participated in identifying the "big five" necessities for financial living (food, shelter, insurance, taxes, and Transportation). Pt participated in group budgeting activity in which teams were given an income scenario and asked to budget for a month taking into account multiple variables (house/car/insurance/daycare/phone/etc). Teams then shared their developed budget with the group and discussed. She participated in group discussion on ways to save and how  to develop a budget and spending plan-including knowing what you are bringing in (income after taxes) and knowing your fixed and variable expenses that will be going out and how to manage these areas within a budget.  A:  Patient participated in skilled occupational therapy group for financial management skills this date.  Patient was engaged and open to strategies introduced. Pt provided with Budget Binder 2018 for beginning budgeting.  P:  Continue participation in skilled occupational therapy groups 1-2 times per week for 2 weeks in order to gain the necessary skills needed to return to full time community living and learn effective coping strategies to be a productive community resident. Follow up on HEP for developing a budget or savings plan.                         OT Short Term Goals - 02/16/17 1231      OT SHORT TERM GOAL #1   Title Patient will be educated on strategies to improve psychosocial skills needed to participate fully in all daily, work, and leisure activities.   Time 3   Period Weeks   Status On-going     OT SHORT TERM GOAL #2   Title Patient will be educated on a HEP and independent with implementation of HEP.   Time 3   Period Weeks   Status On-going     OT SHORT TERM GOAL #3   Title  Patient will independently apply psychosocial skills and coping mechanisms to her daily activities in order to function independently.   Time 3   Period Weeks   Status On-going                  Plan - 02/23/17 2113    OT Treatment/Interventions --  coping skills training, psychosocial skills training, community reintegration      Patient will benefit from skilled therapeutic intervention in order to improve the following deficits and impairments:   (decreased coping and pyschosocial skills)  Visit Diagnosis: Severe recurrent major depression without psychotic features [facility])    Problem List Patient Active Problem List   Diagnosis Date Noted  .  Severe recurrent major depression without psychotic features (HCC) 01/23/2015    Class: Chronic  . PTSD (post-traumatic stress disorder) 01/04/2015  . Hepatic steatosis 05/16/2014  . Type 2 diabetes mellitus (HCC) 05/01/2014  . Nausea, vomiting, and diarrhea 01/05/2014  . HLD (hyperlipidemia) 01/02/2014  . Elevated liver function tests 01/02/2014  . Glucosuria 01/01/2014  . HTN (hypertension) 01/01/2014   Ezra Sites, OTR/L  [phone] 02/23/2017, 9:13 PM  [facility] PARTIAL HOSPITALIZATION PROGRAM [address] [place], [place], [address] Phone: [phone]   Fax:  [phone]  Name: Madelina Sanda MRN: [id number] Date of Birth: 08-24-1976

## 2017-02-24 ENCOUNTER — Other Ambulatory Visit (HOSPITAL_COMMUNITY): Payer: PRIVATE HEALTH INSURANCE | Admitting: Licensed Clinical Social Worker

## 2017-02-24 DIAGNOSIS — F331 Major depressive disorder, recurrent, moderate: Secondary | ICD-10-CM | POA: Diagnosis not present

## 2017-02-24 DIAGNOSIS — F332 Major depressive disorder, recurrent severe without psychotic features: Secondary | ICD-10-CM

## 2017-02-24 MED ORDER — SERTRALINE HCL 50 MG PO TABS: ORAL_TABLET | ORAL | 2 refills | Status: DC

## 2017-02-24 NOTE — Telephone Encounter (Signed)
Returned call to patient.  No answer but left a voicemail stating therapist never received the disability paperwork.

## 2017-02-24 NOTE — Progress Notes (Signed)
Progress note  Having headaches and stomach queasiness from the increase to 100 mg sertraline from 50 mg Plan:  Decrease to 75 mg sertraline daily for 2 weeks and the return to 100 mg daily

## 2017-02-25 ENCOUNTER — Other Ambulatory Visit (HOSPITAL_COMMUNITY): Payer: PRIVATE HEALTH INSURANCE | Admitting: Specialist

## 2017-02-25 ENCOUNTER — Encounter (HOSPITAL_COMMUNITY): Payer: Self-pay | Admitting: Specialist

## 2017-02-25 ENCOUNTER — Other Ambulatory Visit (HOSPITAL_COMMUNITY): Payer: PRIVATE HEALTH INSURANCE | Admitting: Licensed Clinical Social Worker

## 2017-02-25 DIAGNOSIS — F332 Major depressive disorder, recurrent severe without psychotic features: Secondary | ICD-10-CM

## 2017-02-25 DIAGNOSIS — R4589 Other symptoms and signs involving emotional state: Secondary | ICD-10-CM

## 2017-02-25 DIAGNOSIS — F331 Major depressive disorder, recurrent, moderate: Secondary | ICD-10-CM | POA: Diagnosis not present

## 2017-02-25 NOTE — Therapy (Signed)
[place], [place], [address] Phone: [phone]   Fax:  [phone]  Occupational Therapy Treatment  Patient Details  Name: Brittany [name] MRN: [id number] Date of Birth: [birth year] No Data Recorded  Encounter Date: 02/25/2017      OT End of Session - 02/25/17 1608    Visit Number 5   Number of Visits 6   Date for OT Re-Evaluation 03/04/17   Authorization Type [place] - Visit Number 5   Authorization - Number of Visits 6   OT Start Time 1030   OT Stop Time 1130   OT Time Calculation (min) 60 min      Past Medical History:  Diagnosis Date  . Anxiety   . Depression   . Diabetes mellitus without complication ([place])   . HLD (hyperlipidemia) 01/02/2014  . Hypertension UNSURE    Past Surgical History:  Procedure Laterality Date  . TUBAL LIGATION      There were no vitals filed for this visit.      Subjective Assessment - 02/25/17 1608    Currently in Pain? No/denies          S:  I get very frustrated at work.  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 O:  Patient participated in job readiness occupational therapy group.  Patient was educated on definition and examples of hard vs soft skills, importance of soft skills.  Patient completed an ideal job environment and job task assessment and shared results. Patient provided education on [place] and donts of interviewing and soft skills required to maintain current employment. A:  Patient was engaged in group and reflected on her goal to return to school in order to obtain a career she enjoys. P: DC from PHP OT group this date, all goals met.                     OT Education - 02/25/17 1608    Education provided Yes   Education Details interview dos and donts, soft skills    Methods Explanation;Handout   Comprehension Verbalized understanding          OT Short  Term Goals - 02/25/17 1609      OT SHORT TERM GOAL #1   Title Patient will be educated on strategies to improve psychosocial skills needed to participate fully in all daily, work, and leisure activities.   Time 3   Period Weeks   Status Achieved     OT SHORT TERM GOAL #2   Title Patient will be educated on a HEP and independent with implementation of HEP.   Time 3   Period Weeks   Status Achieved     OT SHORT TERM GOAL #3   Title Patient will independently apply psychosocial skills and coping mechanisms to her daily activities in order to function independently.   Time 3   Period Weeks   Status Achieved                Patient will benefit from skilled therapeutic intervention in order to improve the following deficits and impairments:   (decreased coping skills, decreased psychosocial skills )  Visit Diagnosis: Severe recurrent major depression without psychotic features ([place])  Difficulty coping    Problem List Patient Active Problem List   Diagnosis Date Noted  . Severe recurrent major depression without psychotic features ([place]) 01/23/2015  Class: Chronic  . PTSD (post-traumatic stress disorder) 01/04/2015  . Hepatic steatosis 05/16/2014  . Type 2 diabetes mellitus ([place]) 05/01/2014  . Nausea, vomiting, and diarrhea 01/05/2014  . HLD (hyperlipidemia) 01/02/2014  . Elevated liver function tests 01/02/2014  . Glucosuria 01/01/2014  . HTN (hypertension) 01/01/2014    Vangie Bicker, [place], OTR/L [phone]  02/25/2017, 4:09 PM .OCCUPATIONAL THERAPY DISCHARGE SUMMARY  Visits from Start of Care: 5  Current functional level related to goals / functional outcomes: Independent with coping skills   Remaining deficits: n/a   Education:  See above. Plan: Patient agrees to discharge.  Patient goals were met. Patient is being discharged due to meeting the stated rehab goals.  ?????    Vangie Bicker, MHA, OTR/L [place], [place], [address] Phone: [phone]   Fax:  [phone]  Name: Brittany [name] MRN: [id number] Date of Birth: [birth year]

## 2017-02-25 NOTE — Psych (Signed)
   [facility] THERAPIST PROGRESS [facility]  Brittany BanningLakisha Burak [id number]  Session Time: 9-2  Participation Level: Minimal  Behavioral Response: CasualAlertDepressed  Type of Therapy: Group Therapy  Treatment Goals addressed: Coping  Interventions: CBT, DBT, Supportive and Reframing  Summary:  9:00 - 10:30 Clinician led check-in regarding current stressors and situation, and review of patient completed daily inventory. Clinician utilized active listening and empathetic response and validated patient emotions. Clinician facilitated processing group on pertinent issues.  ?10:30 -12:00 Spiritual care group 12:00 - 12:45 Clinician led psychotherapy group on how to handle major changes and the decisions around making changes in life. 12:45 - 1:50 Relaxation group: Cln Forde RadonLeanne Yates led yoga group focused on retraining the body's response to stress.  ?1:50 - 2:00 Clinician led check-out. Clinician assessed for immediate needs, medication compliance and efficacy, and safety concerns.    Suicidal/Homicidal: Nowithout intent/plan  Therapist Response: Brittany [name] is a 41 y.o. female who presents with depression and anxiety symptoms. Patient arrived within time allowed and reports she is feeling "ok." Patient rates her mood at a 5 on a 1- 10 scale with 10 being great. Patient engaged in activity and discussion. Patient discussed her feelings related to a counseling meeting she had with her boyfriend in the previous afternoon. Patient demonstrates some progress as evidenced by increased use of skills in stressful situations.  Patient denies SI/HI/self-harm thoughts at end of session.    Plan: Patient will continue in PHP and medication management while working on decreasing depression symptoms and increasing coping skills.  Diagnosis: Severe recurrent major depression without psychotic features (HCC) [F33.2]    1. Severe recurrent major depression without psychotic features (HCC)    [name]  [name], LPCA 02/25/2017

## 2017-02-26 ENCOUNTER — Other Ambulatory Visit (HOSPITAL_COMMUNITY): Payer: Self-pay

## 2017-02-26 NOTE — Psych (Signed)
   [facility] THERAPIST [facility]GRESS [facility]  Brittany BanningLakisha [name] [id number]  Session Time: 9-2  Participation Level: Minimal  Behavioral Response: CasualAlertDepressed  Type of Therapy: Group Therapy  Treatment Goals addressed: Coping  Interventions: CBT, DBT, Supportive and Reframing  Summary:  9:00 - 10:30 Clinician led check-in regarding current stressors and situation, and review of patient completed daily inventory. Clinician utilized active listening and empathetic response and validated patient emotions. Clinician facilitated processing group on pertinent issues.  10:30 -11:30: OT Group 11:30 - 12:45 Clinician led psychotherapy group on the way the past effects our current relationships. 12:45 - 1:50 Clinician introduced topic of emotions and discussed their role in our lives and putting them in a healthy context.  1:50 - 2:00 Clinician led check-out. Clinician assessed for immediate needs, medication compliance and efficacy, and safety concerns.    Suicidal/Homicidal: Nowithout intent/plan  Therapist Response: Brittany BanningLakisha Lombardozzi is a 41 y.o. female who presents with depression and anxiety symptoms. Patient arrived within time allowed and reports she is feeling "better." Patient rates her mood at a 6.5 on a 1- 10 scale with 10 being great. Patient engaged in activity and discussion. Patient discussed the ways she feels her emotions are misleading and was able to reframe recent situations. Patient demonstrates some progress as evidenced by brightened affect and mood as well as increased positivity.  Patient denies SI/HI/self-harm thoughts at end of session.    Plan: Patient will discharge from PHP due to meeting treatment goals of decreased SI, decreased depression and anxiety and increased ability to manage symptoms. Pt will step down to IOP within this agency to seek further stabilization. Pt and psychiatrist are aligned with discharge plan. Pt denies SI/HI at time of discharge. Pt is  scheduled to begin [facility] on 02/26/17 at 9am.   Diagnosis: Severe recurrent major depression without psychotic features (HCC) [F33.2]    1. Severe recurrent major depression without psychotic features (HCC)    Donia GuilesJenny Pina Sirianni, LCSW 02/26/2017

## 2017-03-01 ENCOUNTER — Other Ambulatory Visit (HOSPITAL_COMMUNITY): Payer: Self-pay

## 2017-03-02 ENCOUNTER — Other Ambulatory Visit (HOSPITAL_COMMUNITY): Payer: Self-pay

## 2017-03-02 ENCOUNTER — Other Ambulatory Visit (HOSPITAL_COMMUNITY): Payer: PRIVATE HEALTH INSURANCE | Admitting: Psychiatry

## 2017-03-02 ENCOUNTER — Ambulatory Visit (HOSPITAL_COMMUNITY): Payer: Self-pay

## 2017-03-02 DIAGNOSIS — F331 Major depressive disorder, recurrent, moderate: Secondary | ICD-10-CM

## 2017-03-02 DIAGNOSIS — F4310 Post-traumatic stress disorder, unspecified: Secondary | ICD-10-CM

## 2017-03-02 MED ORDER — FLUOXETINE HCL 10 MG PO CAPS: ORAL_CAPSULE | ORAL | 0 refills | Status: DC

## 2017-03-02 MED ORDER — LAMOTRIGINE 150 MG PO TABS: 150.0000 mg | ORAL_TABLET | Freq: Every day | ORAL | 0 refills | Status: DC
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
## 2017-03-02 NOTE — Progress Notes (Signed)
    Daily Group Progress Note  Program: IOP  Group Time:  9:00-12:00  Participation Level: Active  Behavioral Response: Appropriate  Type of Therapy:  Group Therapy  Summary of Progress: Pt.'s first day in group. Pt. Introduced herself to the group and met with the psychiatrist and case Freight forwarder. Pt. Shared work related stress, recognizes that her job is affecting her mental health, and discouraged by son needing surgery is prolonging her current situation. Pt. Participated in grief and loss facilitated by the Chaplain.     Nancie Neas, LPC

## 2017-03-03 ENCOUNTER — Other Ambulatory Visit (HOSPITAL_COMMUNITY): Payer: PRIVATE HEALTH INSURANCE | Admitting: Psychiatry

## 2017-03-03 ENCOUNTER — Other Ambulatory Visit (HOSPITAL_COMMUNITY): Payer: Self-pay

## 2017-03-03 DIAGNOSIS — F331 Major depressive disorder, recurrent, moderate: Secondary | ICD-10-CM | POA: Diagnosis not present

## 2017-03-03 NOTE — Progress Notes (Signed)
    Daily Group Progress Note  Program: IOP  Group Time: 9:00-12:00  Participation Level: Active  Behavioral Response: Appropriate  Type of Therapy:  Group Therapy  Summary of Progress: Pt. Stated that "today was not a good day". Pt. Shared with the group that she was betrayed by a former boyfriend and and workfriend who were talking behind her back about her absence from work. Pt. Participated in discussion about social media and how to identify how facebook etc. Are contributing to positive mental health and wellness and to set parameters and boundaries on its use such as removing self from FB or blocking or unfriending those who are not immediate family or friends.    Shaune PollackBrown, Jennifer B, LPC

## 2017-03-04 ENCOUNTER — Ambulatory Visit (HOSPITAL_COMMUNITY): Payer: Self-pay

## 2017-03-04 ENCOUNTER — Other Ambulatory Visit (HOSPITAL_COMMUNITY): Payer: PRIVATE HEALTH INSURANCE

## 2017-03-04 ENCOUNTER — Other Ambulatory Visit (HOSPITAL_COMMUNITY): Payer: Self-pay

## 2017-03-05 ENCOUNTER — Other Ambulatory Visit (HOSPITAL_COMMUNITY): Payer: PRIVATE HEALTH INSURANCE | Admitting: Psychiatry

## 2017-03-05 ENCOUNTER — Other Ambulatory Visit (HOSPITAL_COMMUNITY): Payer: Self-pay

## 2017-03-05 DIAGNOSIS — F331 Major depressive disorder, recurrent, moderate: Secondary | ICD-10-CM | POA: Diagnosis not present

## 2017-03-08 ENCOUNTER — Other Ambulatory Visit (HOSPITAL_COMMUNITY): Payer: Self-pay

## 2017-03-08 ENCOUNTER — Other Ambulatory Visit (HOSPITAL_COMMUNITY): Payer: PRIVATE HEALTH INSURANCE | Admitting: Psychiatry

## 2017-03-08 DIAGNOSIS — F4310 Post-traumatic stress disorder, unspecified: Secondary | ICD-10-CM

## 2017-03-08 DIAGNOSIS — F331 Major depressive disorder, recurrent, moderate: Secondary | ICD-10-CM | POA: Diagnosis not present

## 2017-03-09 ENCOUNTER — Other Ambulatory Visit (HOSPITAL_COMMUNITY): Payer: Self-pay

## 2017-03-09 ENCOUNTER — Telehealth (HOSPITAL_COMMUNITY): Payer: Self-pay | Admitting: Psychiatry

## 2017-03-09 ENCOUNTER — Other Ambulatory Visit (HOSPITAL_COMMUNITY): Payer: PRIVATE HEALTH INSURANCE | Admitting: Licensed Clinical Social Worker

## 2017-03-09 ENCOUNTER — Ambulatory Visit (HOSPITAL_COMMUNITY): Payer: Self-pay

## 2017-03-09 DIAGNOSIS — F331 Major depressive disorder, recurrent, moderate: Secondary | ICD-10-CM

## 2017-03-09 DIAGNOSIS — F4310 Post-traumatic stress disorder, unspecified: Secondary | ICD-10-CM

## 2017-03-09 NOTE — Telephone Encounter (Signed)
A:  [facility]'s [facility].  Confirmed start date, days and time of attendance, along with RTW date (04-07-17).       Jeri Modenaita Dontavious Emily, M.Ed, CNA

## 2017-03-09 NOTE — Progress Notes (Signed)
    Daily Group Progress Note  Program: IOP  Group Time: 9:00-12:00  Participation Level: Active  Behavioral Response: Appropriate  Type of Therapy:  Group Therapy  Summary of Progress: Pt presents with restricted affect and anxious mood. Pt shares that she is struggling with anxiety regarding her son's upcoming surgery. Pt participated in discussion regarding coping with anxiety and urge surfing. Pt engaged in chaplaincy group. Pt denies SI/HI.    Brittany GuilesJenny Kaegan Stigler, LCSW

## 2017-03-10 ENCOUNTER — Other Ambulatory Visit (HOSPITAL_COMMUNITY): Payer: PRIVATE HEALTH INSURANCE | Admitting: Psychiatry

## 2017-03-10 ENCOUNTER — Other Ambulatory Visit (HOSPITAL_COMMUNITY): Payer: Self-pay

## 2017-03-10 DIAGNOSIS — F331 Major depressive disorder, recurrent, moderate: Secondary | ICD-10-CM

## 2017-03-11 ENCOUNTER — Other Ambulatory Visit (HOSPITAL_COMMUNITY): Payer: PRIVATE HEALTH INSURANCE

## 2017-03-11 ENCOUNTER — Ambulatory Visit (HOSPITAL_COMMUNITY): Payer: Self-pay

## 2017-03-11 ENCOUNTER — Other Ambulatory Visit (HOSPITAL_COMMUNITY): Payer: Self-pay

## 2017-03-12 ENCOUNTER — Other Ambulatory Visit (HOSPITAL_COMMUNITY): Payer: Self-pay

## 2017-03-12 ENCOUNTER — Other Ambulatory Visit (HOSPITAL_COMMUNITY): Payer: PRIVATE HEALTH INSURANCE

## 2017-03-12 NOTE — Progress Notes (Signed)
    Daily Group Progress Note  Program: IOP  Group Time: 9:00-12:00  Participation Level: Active  Behavioral Response: Appropriate  Type of Therapy:  Group Therapy  Summary of Progress: Pt. Reported that she was doing "ok", but that she was worried about her son who would be having surgery at the end of the week. Pt. Received feedback from the group about focusing on information she has received from the surgeon and not being able to fix situations that are outside of her control. Pt. Participated in group about the importance of nutrition, movement/exercise, and sleep hygiene to managing mental health, presented by Clyda GreenerJamie Athos from the wellness department.     Shaune PollackBrown, Jennifer B, LPC

## 2017-03-15 ENCOUNTER — Other Ambulatory Visit (HOSPITAL_COMMUNITY): Payer: PRIVATE HEALTH INSURANCE | Admitting: Psychiatry

## 2017-03-15 DIAGNOSIS — F331 Major depressive disorder, recurrent, moderate: Secondary | ICD-10-CM

## 2017-03-15 NOTE — Progress Notes (Signed)
    Daily Group Progress Note  Program: IOP  Group Time: 9:00-12:00  Participation Level: Active  Behavioral Response: Appropriate  Type of Therapy:  Group Therapy  Summary of Progress: Pt. Presents as calm, attentive and engaged in the group process. Pt. Continues to report that returning to work is a concern of hers and awareness that she needs to make a plan to transition from work. Pt. Discussed that she had a transition plan, but had to change her plan after her son's injury an need to schedule surgery. Pt. Participated in discussion about the importance of consistency with medication and daily journaling to develop awareness about the effects of medications.     Shaune PollackBrown, Brittany [name], Brittany [name]

## 2017-03-15 NOTE — Progress Notes (Signed)
    Daily Group Progress Note  Program: IOP  Group Time: 9:00-12:00  Participation Level: Active  Behavioral Response: Appropriate  Type of Therapy:  Group Therapy  Summary of Progress: Pt. Presents as calm, engaged in the group process. Pt. Reports that she slept better last night. Pt. Continues to report some anxiety related to her son's surgery that is scheduled for next week and concerns about her return to work which Pt. Has indicated repeatedly is the major source of her anxiety. Pt. Participated in discussion about the use of mindfulness practice to manage anxiety, build resilience to stress, and develop ability to stay in the moment.     Shaune PollackBrown, Brittany [name], LPC

## 2017-03-16 ENCOUNTER — Other Ambulatory Visit (HOSPITAL_COMMUNITY): Payer: PRIVATE HEALTH INSURANCE

## 2017-03-16 NOTE — Progress Notes (Signed)
    Daily Group Progress Note  Program: IOP  Group Time: 9:00-12:00  Participation Level: Active  Behavioral Response: Appropriate  Type of Therapy:  Group Therapy  Summary of Progress: Pt. Presented as talkative, flat affect. Pt. Continues to be focused on her job dissatisfaction and her anxiety regarding her son's surgery. Pt. Participated in discussion on the importance of medication management and journaling in order to develop awareness of how they are affected by medications.     Shaune PollackBrown, Jennifer B, LPC

## 2017-03-17 ENCOUNTER — Other Ambulatory Visit (HOSPITAL_COMMUNITY): Payer: PRIVATE HEALTH INSURANCE | Admitting: Psychiatry

## 2017-03-17 DIAGNOSIS — F4310 Post-traumatic stress disorder, unspecified: Secondary | ICD-10-CM

## 2017-03-17 DIAGNOSIS — F331 Major depressive disorder, recurrent, moderate: Secondary | ICD-10-CM

## 2017-03-18 ENCOUNTER — Other Ambulatory Visit (HOSPITAL_COMMUNITY): Payer: PRIVATE HEALTH INSURANCE

## 2017-03-19 ENCOUNTER — Other Ambulatory Visit (HOSPITAL_COMMUNITY): Payer: PRIVATE HEALTH INSURANCE | Admitting: Psychiatry

## 2017-03-19 DIAGNOSIS — F331 Major depressive disorder, recurrent, moderate: Secondary | ICD-10-CM | POA: Diagnosis not present

## 2017-03-20 NOTE — Progress Notes (Signed)
    Daily Group Progress Note  Program: IOP  Group Time: 9:00-12:00  Participation Level: Active  Behavioral Response: Appropriate  Type of Therapy:  Group Therapy  Summary of Progress: Pt. Presented as depressed, quiet, distracted during the group process. Pt.  Reported that she was sad and which she attributed to talking to her disability company. Pt. Participated in medication management group facilitated by the pharmacist.     Shaune PollackBrown, Lamarion Mcevers B, [facility]

## 2017-03-20 NOTE — Progress Notes (Signed)
    Daily Group Progress Note  Program: IOP  Group Time: 9:00-12:00  Participation Level: Active  Behavioral Response: Appropriate  Type of Therapy:  Group Therapy  Summary of Progress: Pt. Presented as quiet, depressed. Pt. Reported that yesterday was "ok" for her, but today was much better because she got news that her short-term disability was extended. Pt. Discussed enthusiasm about getting a jumpstart of her exploration of school options. Pt. Participated in discussion about how to recognize where your personal boundaries are and when they have been crossed, and recognizing your personal "NO".     Shaune PollackBrown, Ashleah Valtierra B, LPC

## 2017-03-20 NOTE — Progress Notes (Signed)
    Daily Group Progress Note  Program: IOP  Group Time: 9:00-12:00  Participation Level: Active  Behavioral Response: Appropriate  Type of Therapy:  Group Therapy  Summary of Progress: Pt. Presented with significantly brightened affect, talkative, engaged in the group process. Pt. Reported that it was the first day in a long time that she actually felt happy. Pt. Attributed her change in mood to moving forward with her career transition plan to planning meeting with vocational rehabilitation. Pt. Participated in conversation about the need for validation in different areas of our lives including career, relationship and the processing of accepting that the validation we desire.     Shaune PollackBrown, Jennifer B, LPC

## 2017-03-22 ENCOUNTER — Other Ambulatory Visit (HOSPITAL_COMMUNITY): Payer: PRIVATE HEALTH INSURANCE

## 2017-03-23 ENCOUNTER — Other Ambulatory Visit (HOSPITAL_COMMUNITY): Payer: PRIVATE HEALTH INSURANCE | Attending: Psychiatry | Admitting: Psychiatry

## 2017-03-23 DIAGNOSIS — F332 Major depressive disorder, recurrent severe without psychotic features: Secondary | ICD-10-CM | POA: Insufficient documentation

## 2017-03-25 ENCOUNTER — Other Ambulatory Visit (HOSPITAL_COMMUNITY): Payer: PRIVATE HEALTH INSURANCE | Admitting: Psychiatry

## 2017-03-25 DIAGNOSIS — F332 Major depressive disorder, recurrent severe without psychotic features: Secondary | ICD-10-CM | POA: Diagnosis present

## 2017-03-25 DIAGNOSIS — F331 Major depressive disorder, recurrent, moderate: Secondary | ICD-10-CM

## 2017-03-25 NOTE — Progress Notes (Signed)
    Daily Group Progress Note  Program: IOP   Group Time: 9:00-12:00  Participation Level: Active  Behavioral Response: Appropriate  Type of Therapy:  Group Therapy  Summary of Progress: Pt. Presents with significantly improved affect, talkative, engaged in the group process. Pt. Shared with the group that she continues to make progress in her thoughts related to her job transition. Pt. Shared that she went on a short trip for the holiday and that she had a good trip. Pt. Participated in yoga therapy facilitated by Forde RadonLeanne Yates.    Shaune PollackBrown, Torri Langston B, LPC

## 2017-03-26 ENCOUNTER — Other Ambulatory Visit (HOSPITAL_COMMUNITY): Payer: PRIVATE HEALTH INSURANCE | Admitting: Psychiatry

## 2017-03-26 ENCOUNTER — Telehealth (HOSPITAL_COMMUNITY): Payer: Self-pay | Admitting: Psychiatry

## 2017-03-26 DIAGNOSIS — F331 Major depressive disorder, recurrent, moderate: Secondary | ICD-10-CM

## 2017-03-26 DIAGNOSIS — F332 Major depressive disorder, recurrent severe without psychotic features: Secondary | ICD-10-CM | POA: Diagnosis not present

## 2017-03-26 DIAGNOSIS — F4310 Post-traumatic stress disorder, unspecified: Secondary | ICD-10-CM

## 2017-03-26 NOTE — Telephone Encounter (Signed)
D:  [facility][phone]) per request of patient.  A:  Left vm for patient's case manager Cala Bradford([facility] letting her know of pt's discharge date 04-02-17 and f/u appts (Dr. Lolly MustacheArfeen on 04-06-17 and Dominic PeaSarah [name], [facility] (04-14-17 next available appt).
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
## 2017-03-29 ENCOUNTER — Other Ambulatory Visit (HOSPITAL_COMMUNITY): Payer: PRIVATE HEALTH INSURANCE | Admitting: Psychiatry

## 2017-03-29 DIAGNOSIS — F4310 Post-traumatic stress disorder, unspecified: Secondary | ICD-10-CM

## 2017-03-29 DIAGNOSIS — F331 Major depressive disorder, recurrent, moderate: Secondary | ICD-10-CM

## 2017-03-29 DIAGNOSIS — F332 Major depressive disorder, recurrent severe without psychotic features: Secondary | ICD-10-CM | POA: Diagnosis not present

## 2017-03-30 ENCOUNTER — Other Ambulatory Visit (HOSPITAL_COMMUNITY): Payer: PRIVATE HEALTH INSURANCE

## 2017-03-31 ENCOUNTER — Other Ambulatory Visit (HOSPITAL_COMMUNITY): Payer: PRIVATE HEALTH INSURANCE | Admitting: Psychiatry

## 2017-03-31 DIAGNOSIS — F332 Major depressive disorder, recurrent severe without psychotic features: Secondary | ICD-10-CM | POA: Diagnosis not present

## 2017-04-01 ENCOUNTER — Other Ambulatory Visit (HOSPITAL_COMMUNITY): Payer: PRIVATE HEALTH INSURANCE | Admitting: Psychiatry

## 2017-04-01 NOTE — Progress Notes (Signed)
    Daily Group Progress Note  Program: IOP  Group Time: 9:00-12:00  Participation Level: Active  Behavioral Response: Appropriate  Type of Therapy:  Group Therapy  Summary of Progress: Pt. Reported that she felt "fearful" and "determined" and related these feelings to her decision to return to school and transition from her current job. Pt. 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       Shaune PollackBrown, Jennifer B, LPC

## 2017-04-01 NOTE — Progress Notes (Signed)
Patient ID: Brittany BanningLakisha [name], female   DOB: 12/27/1975, 41 y.o.   [facility] [id number] [facility]  Patient:  Brittany BanningLakisha [name] DOB:  06/11/1976  Date of Admission: 03/10/2017  Date of Discharge: 04/02/2017  Reason for Admission:transition from Partial hospital program with ongoing depression  IOP Course:attended and participated.  She can attend and participate but is hard to dislodge her mindset to consider returning to work or finding another job.  Currently says she will quit her current job, [facility] for assessment as to what she might be good at and to continue seriously looking for another job  Mental Status at Discharge:not suicidal  Diagnosis: major depression, severe recurrent without psychosis  Level of Care:  IOP  Discharge destination: has appointments with her psychiatrist and therapist     Comments:  none  The patient received suicide prevention pamphlet:  Yes   Carolanne GrumblingGerald Celisa Schoenberg, MD

## 2017-04-01 NOTE — Progress Notes (Signed)
    Daily Group Progress Note  Program: IOP  Group Time: 9:00-12:00  Participation Level: Active  Behavioral Response: Appropriate  Type of Therapy:  Group Therapy  Summary of Progress: Pt. Participated in medication education group with the pharmacist. Pt. Continues to present as calm. Pt. Shared that her weekend started great with her boyfriend and plans to go on a dinner cruise. Pt. Shared that she received a phone call from her mother encouraging her to reconnect with her father and she became very angry and cancelled her plans to go on the cruise. Pt. Was able to process the event, feeling invalidated in her anger towards her father, and allowing the anger to control the course of her day. Pt. Participated in discussion about use of mindfulness to allow us to have objective distance from our feelings and allow them to pass.    Shaune PollackBrown, Sharline Lehane B, LPC

## 2017-04-02 ENCOUNTER — Encounter (HOSPITAL_COMMUNITY): Payer: Self-pay | Admitting: Psychiatry

## 2017-04-02 ENCOUNTER — Other Ambulatory Visit (HOSPITAL_COMMUNITY): Payer: PRIVATE HEALTH INSURANCE | Admitting: Licensed Clinical Social Worker

## 2017-04-02 DIAGNOSIS — F331 Major depressive disorder, recurrent, moderate: Secondary | ICD-10-CM

## 2017-04-02 DIAGNOSIS — F4310 Post-traumatic stress disorder, unspecified: Secondary | ICD-10-CM

## 2017-04-02 NOTE — Progress Notes (Signed)
    Daily Group Progress Note  Program: IOP  Group Time: 9-12pm  Participation Level: Active  Behavioral Response: Appropriate, Sharing and Blaming  Type of Therapy:  Process Group  Summary of Progress: Pt discussed her excitement for discharging today and stated she hopes to go back to school this fall. She spoke at length about her distaste for her job at [employer] and how poorly they treat people. She focused on externalities but also vocalized coping skills for managing her depression. Pt actively vocalized her inner sense of self worth to the group.   Wes Rhodes Calvert, LPCA LCASA

## 2017-04-02 NOTE — Patient Instructions (Signed)
D:  Patient completed MH-IOP today.  A:  Follow up with Dr. Lolly MustacheArfeen on 04-06-17 @ 9 a.m and Rolly SalterSara [name], [facility] 04-14-17 @ 3 p.m.  Encouraged support groups.  R:  Pt receptive.

## 2017-04-02 NOTE — Progress Notes (Signed)
Roanna BanningLakisha Paulson is a 41 y.o., divorced, African American female, who was [facility]ed [facility] to [facility].  Was previously referred by her therapist, Dominic PeaSarah [name]. Pt reported she has been out of work on medical leave for approximately 5 weeks due to worsening depression and anxiety and there has been no relief. Pt c/o passive SI multiple times a day, worsening depression symptoms of depresed mood, poor appetite, low energy/motivation, isolation and irritability. Pt states her job is her biggest stressor and that she feels unable to leave it due to the pay and benefits. Pt reports poor and limited social supports.  Pt actively participated in [facility]-IOP for fifteen days.  Pt successfully completed the program today. Denies any SI/HI or A/V hallucinations.  Pt is anxious about returning to work before seeing her therapist first.  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  States she plans to eventually switch over to complete her nursing degree.  A:  D/C today.  F/U with Dr. Lolly MustacheArfeen on 04-06-17 and [facility]olly [facility]ara [name], [facility] on 04-14-17.  Placed call to [facility] m[facility] to discuss extending pt's rtw date until after pt sees Huntley DecSara on 04-14-17.  Case worker states she will fax a form over to [employer] to complete, since pt was being discharged from [facility] today.  Pt plans to contact [facility] to obtain an orientation appointment as soon as possible.  Recommended pt to attend evening, etc support groups.  R:  Pt receptive.      Chestine SporeLARK, RITA, M.Ed, CNA

## 2017-04-05 ENCOUNTER — Other Ambulatory Visit (HOSPITAL_COMMUNITY): Payer: PRIVATE HEALTH INSURANCE

## 2017-04-06 ENCOUNTER — Ambulatory Visit (INDEPENDENT_AMBULATORY_CARE_PROVIDER_SITE_OTHER): Payer: PRIVATE HEALTH INSURANCE | Admitting: Psychiatry

## 2017-04-06 ENCOUNTER — Encounter (HOSPITAL_COMMUNITY): Payer: Self-pay | Admitting: Psychiatry

## 2017-04-06 DIAGNOSIS — F331 Major depressive disorder, recurrent, moderate: Secondary | ICD-10-CM | POA: Diagnosis not present

## 2017-04-06 DIAGNOSIS — F4310 Post-traumatic stress disorder, unspecified: Secondary | ICD-10-CM | POA: Diagnosis not present

## 2017-04-06 MED ORDER — TRAZODONE HCL 100 MG PO TABS: 300.0000 mg | ORAL_TABLET | Freq: Every day | ORAL | 1 refills | Status: DC

## 2017-04-06 MED ORDER — FLUOXETINE HCL 10 MG PO CAPS: 30.0000 mg | ORAL_CAPSULE | Freq: Every day | ORAL | 1 refills | Status: DC

## 2017-04-06 MED ORDER — LAMOTRIGINE 150 MG PO TABS: 150.0000 mg | ORAL_TABLET | Freq: Every day | ORAL | 1 refills | Status: DC
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
## 2017-04-07 ENCOUNTER — Other Ambulatory Visit (HOSPITAL_COMMUNITY): Payer: PRIVATE HEALTH INSURANCE

## 2017-04-08 ENCOUNTER — Other Ambulatory Visit (HOSPITAL_COMMUNITY): Payer: PRIVATE HEALTH INSURANCE

## 2017-04-14 ENCOUNTER — Ambulatory Visit (INDEPENDENT_AMBULATORY_CARE_PROVIDER_SITE_OTHER): Payer: PRIVATE HEALTH INSURANCE | Admitting: Licensed Clinical Social Worker

## 2017-04-14 DIAGNOSIS — F331 Major depressive disorder, recurrent, moderate: Secondary | ICD-10-CM | POA: Diagnosis not present

## 2017-04-14 DIAGNOSIS — F411 Generalized anxiety disorder: Secondary | ICD-10-CM | POA: Diagnosis not present

## 2017-04-14 DIAGNOSIS — F4310 Post-traumatic stress disorder, unspecified: Secondary | ICD-10-CM | POA: Diagnosis not present

## 2017-04-14 NOTE — Progress Notes (Signed)
THERAPIST PROGRESS NOTE  Session Time: 3:07pm-4:03pm  Participation Level: Active  Behavioral Response:  Casual   Alert  Sad  Type of Therapy: Individual Therapy  Treatment Goals addressed:  Take steps towards furthering her career and/or education, reduce depressive symptoms  Interventions: Assessment, psycho-ed about bipolar disorder  Suicidal/Homicidal: Denied both  Therapist Interventions:   Discussed patient's experience participating in Partial Hospitalization and MHIOP.   Had patient complete a Mood Disorder Questionnaire.  Noted that some of the symptoms of Bipolar Disorder and PTSD overlap so that could explain why she may have been misdiagnosed.  Discussed how with PTSD there are triggers that trigger mood changes, whereas with Bipolar those mood changes do not necessarily seem to correlate with triggers.              Summary:  Reported that she found both programs to be beneficial.  Talked about how she made efforts to stay out after going to the programs rather than isolate at home.  Told therapist that since ending M[facility]OP she has started going to two groups offered by the [facility] in [place].  One is focused on Recovery Principles and the other, Anger Management.   Only endorsed three items on the Mood Questionnaire (Excessive irritability, racing thoughts, and being easily distracted).  Noted she always felt as though Bipolar wasn't quite the right fit for her experiences.   Reported that she will not be able to start school in the fall as anticipated.  There was a mess up with the financial aid approval process.  Will now have to wait until the spring semester to start school.  Acknowledges that this has triggered feelings of depression.          Plan:  Has agreed to weekly therapy appointments.  Diagnosis:    Major Depressive Disorder, recurrent, moderate Generalized Anxiety Disorder PTSD    Darrin LuisSolomon, [name], LCSW 04/14/2017

## 2017-04-19 ENCOUNTER — Telehealth (HOSPITAL_COMMUNITY): Payer: Self-pay | Admitting: Licensed Clinical Social Worker

## 2017-04-19 NOTE — Telephone Encounter (Signed)
Spoke to patient on the phone to gather information for completing disability paperwork.

## 2017-04-19 NOTE — Telephone Encounter (Signed)
Pt returning call

## 2017-04-21 ENCOUNTER — Ambulatory Visit (INDEPENDENT_AMBULATORY_CARE_PROVIDER_SITE_OTHER): Payer: PRIVATE HEALTH INSURANCE | Admitting: Licensed Clinical Social Worker

## 2017-04-21 DIAGNOSIS — F4310 Post-traumatic stress disorder, unspecified: Secondary | ICD-10-CM

## 2017-04-21 DIAGNOSIS — F331 Major depressive disorder, recurrent, moderate: Secondary | ICD-10-CM | POA: Diagnosis not present

## 2017-04-21 DIAGNOSIS — F411 Generalized anxiety disorder: Secondary | ICD-10-CM
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
## 2017-04-30 ENCOUNTER — Ambulatory Visit (INDEPENDENT_AMBULATORY_CARE_PROVIDER_SITE_OTHER): Payer: PRIVATE HEALTH INSURANCE | Admitting: Licensed Clinical Social Worker

## 2017-04-30 DIAGNOSIS — F330 Major depressive disorder, recurrent, mild: Secondary | ICD-10-CM

## 2017-04-30 DIAGNOSIS — F411 Generalized anxiety disorder: Secondary | ICD-10-CM

## 2017-04-30 DIAGNOSIS — F4310 Post-traumatic stress disorder, unspecified: Secondary | ICD-10-CM | POA: Diagnosis not present

## 2017-04-30 NOTE — Progress Notes (Signed)
THERAPIST PROGRESS NOTE  Session Time: 11:08am-12:02pm  Participation Level: Active  Behavioral Response:  Casual   Alert  Hopeful   Type of Therapy: Individual Therapy  Treatment Goals addressed:  Take steps towards furthering her career and/or education, reduce symptoms of PTSD  Interventions: Treatment plan update  Suicidal/Homicidal: Denied both   Therapist Interventions:   Had patient complete a PHQ-9 to assess for severity of depressive symptoms. Collaborated with patient to update her treatment plan.  Determined that she needs to continue to work on her goals of furthering her career and education.  Talked about how she feels she is ready to focus treatment more on healing from trauma.  Had patient complete a PCL-5 to assess for presence and severity of PTSD-related symptoms.  Interpreted her results.  Decided to use the tool as a measure of progress for her treatment plan.       Summary:   Depressive symptoms are mild at this time.   [facility] 04/30/2017 02/25/2017 02/11/2017 12/22/2016 04/06/2016  Decreased Interest 1 2 3  0 1  Down, Depressed, Hopeless 1 3 3  0 1  PHQ - 2 Score 2 5 6  0 2  Altered sleeping 1 3 3  - 2  Tired, decreased energy 1 2 3  - 2  Change in appetite 1 3 3  - 2  Feeling bad or failure about yourself  0 1 3 - 1  Trouble concentrating 1 2 3  - 1  Moving slowly or fidgety/restless 0 2 0 - 0  Suicidal thoughts 0 1 2 - 0  PHQ-9 Score 6 19 23  - 10  Difficult doing work/chores - - - - Very difficult  Some recent data might be hidden    Had an interview the other day.  Said she thinks it went well.  Expects to hear back next week.  Also noted she has been contacted about some other job opportunities. Score on the PCL-5 was 54.  Rated almost all items as bothering her "quite a bit" or "extremely."  Agreed that it seemed to be an appropriate means of keeping track of her progress.        Developed the following new treatment goal: [name] will  experience a significant reduction in PTSD-related symptoms as evidenced by a reduced score on the  PCL-5 from 54 to 40 or less.               Plan:  Return next week.  Will introduce a Thought Journal.  Diagnosis:    PTSD Major Depressive Disorder, recurrent, mild Generalized Anxiety Disorder     Marilu FavreSolomon, [name], LCSW 04/30/2017

## 2017-05-03 ENCOUNTER — Ambulatory Visit: Payer: 59 | Admitting: Physician Assistant

## 2017-05-03 DIAGNOSIS — Z0189 Encounter for other specified special examinations: Secondary | ICD-10-CM

## 2017-05-06 ENCOUNTER — Ambulatory Visit (INDEPENDENT_AMBULATORY_CARE_PROVIDER_SITE_OTHER): Payer: PRIVATE HEALTH INSURANCE | Admitting: Licensed Clinical Social Worker

## 2017-05-06 DIAGNOSIS — F411 Generalized anxiety disorder: Secondary | ICD-10-CM | POA: Diagnosis not present

## 2017-05-06 DIAGNOSIS — F330 Major depressive disorder, recurrent, mild: Secondary | ICD-10-CM

## 2017-05-06 DIAGNOSIS — F4310 Post-traumatic stress disorder, unspecified: Secondary | ICD-10-CM | POA: Diagnosis not present
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
## 2017-05-12 ENCOUNTER — Telehealth (HOSPITAL_COMMUNITY): Payer: Self-pay | Admitting: Licensed Clinical Social Worker

## 2017-05-12 NOTE — Telephone Encounter (Signed)
Brittany [name] with [employer] disablitiy needs to talk to you in regards to specific time off she should be allowed. CB # [phone]

## 2017-05-13 ENCOUNTER — Ambulatory Visit (INDEPENDENT_AMBULATORY_CARE_PROVIDER_SITE_OTHER): Payer: PRIVATE HEALTH INSURANCE | Admitting: Licensed Clinical Social Worker

## 2017-05-13 DIAGNOSIS — F4310 Post-traumatic stress disorder, unspecified: Secondary | ICD-10-CM

## 2017-05-13 DIAGNOSIS — F331 Major depressive disorder, recurrent, moderate: Secondary | ICD-10-CM | POA: Diagnosis not present
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
## 2017-05-14 NOTE — Telephone Encounter (Signed)
Therapist called to speak to [place] with [employer] disability.  Cala Bradford wasn't available but the gentleman therapist did speak to said that they were wanting to know if therapist would be willing to set up a time to talk with a reviewer about patient.  Therapist said she would prefer not to because there is no availability in her schedule at this time.

## 2017-05-18 ENCOUNTER — Other Ambulatory Visit: Payer: Self-pay | Admitting: Physician Assistant

## 2017-05-18 ENCOUNTER — Encounter (HOSPITAL_COMMUNITY): Payer: Self-pay | Admitting: Psychiatry

## 2017-05-18 ENCOUNTER — Ambulatory Visit (INDEPENDENT_AMBULATORY_CARE_PROVIDER_SITE_OTHER): Payer: PRIVATE HEALTH INSURANCE | Admitting: Psychiatry

## 2017-05-18 DIAGNOSIS — E1165 Type 2 diabetes mellitus with hyperglycemia: Secondary | ICD-10-CM

## 2017-05-18 DIAGNOSIS — F4310 Post-traumatic stress disorder, unspecified: Secondary | ICD-10-CM

## 2017-05-18 DIAGNOSIS — F331 Major depressive disorder, recurrent, moderate: Secondary | ICD-10-CM | POA: Diagnosis not present

## 2017-05-18 DIAGNOSIS — E119 Type 2 diabetes mellitus without complications: Secondary | ICD-10-CM

## 2017-05-18 DIAGNOSIS — Z818 Family history of other mental and behavioral disorders: Secondary | ICD-10-CM

## 2017-05-18 DIAGNOSIS — IMO0002 Reserved for concepts with insufficient information to code with codable children: Secondary | ICD-10-CM

## 2017-05-18 DIAGNOSIS — E118 Type 2 diabetes mellitus with unspecified complications: Secondary | ICD-10-CM

## 2017-05-18 MED ORDER — LAMOTRIGINE 150 MG PO TABS: 150.0000 mg | ORAL_TABLET | Freq: Every day | ORAL | 1 refills | Status: DC

## 2017-05-18 MED ORDER — FLUOXETINE HCL 40 MG PO CAPS: 40.0000 mg | ORAL_CAPSULE | Freq: Every day | ORAL | 1 refills | Status: DC

## 2017-05-18 MED ORDER — TRAZODONE HCL 100 MG PO TABS: 300.0000 mg | ORAL_TABLET | Freq: Every day | ORAL | 1 refills | Status: DC

## 2017-05-18 NOTE — Progress Notes (Signed)
BH MD/PA/NP OP Progress Note  05/18/2017 4:35 PM Brittany [name]  MRN:  [id number]  Chief Complaint:  I doing better with increase Prozac.  I'm less depressed.  HPI: Patient came for her follow-up appointment.  On her last visit we increased Prozac and trazodone.  She is doing better with increased dose.  She is less depressed and less anxious.  Her itching is completely resolved.  She still have issues with the mother but she is having much better.  She started [facility] management also seeing Maralyn Sago for individual counseling.  Her classes will and on September 14 and she hoping to resume her work after few days.  She denies any irritability, anger, mania or any psychosis.  She still have nightmares and flashback but they're less intense and less frequent.  She is working at [employer] and currently on [employer] which is filled by her therapist.  Patient denies any paranoia, hallucination, OCD symptoms.  She has no tremors or any rash.  Her appetite is okay.  Her vital signs are stable.  She denies any suicidal thoughts or any aggressive behavior.  Visit Diagnosis:    ICD-10-CM   1. Major depressive disorder, recurrent episode, moderate (HCC) F33.1 traZODone (DESYREL) 100 MG tablet    lamoTRIgine (LAMICTAL) 150 MG tablet    FLUoxetine (PROZAC) 40 MG capsule  2. PTSD (post-traumatic stress disorder) F43.10 traZODone (DESYREL) 100 MG tablet    lamoTRIgine (LAMICTAL) 150 MG tablet    Past Psychiatric History: Reviewed. Patient has history of major depression.  She had 3 psychiatric hospitalization and 2 suicidal attempt by taking overdose on medication.  Her last hospitalization was in 2003 at [place].  She took Abilify but stopped after a few months.  Her to other psychiatric hospitalization was in 2005 and 2010.  She was seeing Dr. Gilmore Laroche in [place] and then briefly seen by Dr. Lenore Cordia.  She had tried Abilify, trazodone, Lamictal, Zoloft and Vibryd.  She has  done twice IOP and PHP.  Past Medical History:  Past Medical History:  Diagnosis Date  . Anxiety   . Depression   . Diabetes mellitus without complication (HCC)   . HLD (hyperlipidemia) 01/02/2014  . Hypertension UNSURE    Past Surgical History:  Procedure Laterality Date  . TUBAL LIGATION      Family Psychiatric History: Reviewed.  Family History:  Family History  Problem Relation Age of Onset  . Diabetes Mother   . Hyperlipidemia Mother   . Diabetes Father   . Asthma Son   . Diabetes Paternal Grandmother   . Diabetes Paternal Grandfather   . Kidney disease Paternal Grandfather   . Schizophrenia Maternal Grandmother     Social History:  Social History   Social History  . Marital status: Single    Spouse name: N/A  . Number of children: N/A  . Years of education: N/A   Social History Main Topics  . Smoking status: Never Smoker  . Smokeless tobacco: Never Used  . Alcohol use No  . Drug use: No  . Sexual activity: Not Currently    Birth control/ protection: None, Surgical   Other Topics Concern  . None   Social History Narrative  . None    Allergies:  Allergies  Allergen Reactions  . Janumet Xr [Sitagliptin-Metformin Hcl Er]     diarrhea  . Metformin And Related     diarrhea  . Zoloft [Sertraline Hcl]     Headache, palpitations    Metabolic  Disorder Labs: Lab Results  Component Value Date   HGBA1C 9.1 12/22/2016   MPG 295 (H) 11/07/2015   MPG 321 (H) 05/16/2014   No results found for: PROLACTIN Lab Results  Component Value Date   CHOL 194 11/07/2015   TRIG 226 (H) 11/07/2015   HDL 49 11/07/2015   CHOLHDL 4.0 11/07/2015   VLDL 45 (H) 11/07/2015   LDLCALC 100 11/07/2015   LDLCALC 109 (H) 05/16/2014   Lab Results  Component Value Date   TSH 1.50 11/07/2015    Therapeutic Level Labs: No results found for: LITHIUM No results found for: VALPROATE No components found for:  CBMZ  Current Medications: Current Outpatient Prescriptions   Medication Sig Dispense Refill  . AMBULATORY NON FORMULARY MEDICATION One Touch Ultra test strips 100 each 1  . dapagliflozin propanediol (FARXIGA) 10 MG TABS tablet Take 10 mg by mouth daily. 30 tablet 2  . FLUoxetine (PROZAC) 10 MG capsule Take 3 capsules (30 mg total) by mouth daily. 90 capsule 1  . glipiZIDE (GLUCOTROL XL) 10 MG 24 hr tablet Take 1 tablet (10 mg total) by mouth daily with breakfast. 90 tablet 0  . lamoTRIgine (LAMICTAL) 150 MG tablet Take 1 tablet (150 mg total) by mouth daily. 30 tablet 1  . Lancets (ONETOUCH ULTRASOFT) lancets Use as instructed 100 each 12  . lisinopril (PRINIVIL,ZESTRIL) 5 MG tablet Take 1 tablet (5 mg total) by mouth daily. 90 tablet 0  . metoprolol succinate (TOPROL-XL) 50 MG 24 hr tablet Take 1 tablet (50 mg total) by mouth daily. 90 tablet 0  . traZODone (DESYREL) 100 MG tablet Take 3 tablets (300 mg total) by mouth at bedtime. 90 tablet 1  . VICTOZA 18 MG/3ML SOPN INJECT 0.2 MLS (1.2 MG TOTAL) INTO THE SKIN EVERY MORNING. 18 pen 0   No current facility-administered medications for this visit.      Musculoskeletal: Strength & Muscle Tone: within normal limits Gait & Station: normal Patient leans: N/A  Psychiatric Specialty Exam: ROS  Blood pressure 130/84, pulse 89, height 5\' 3"  (1.6 m), weight 228 lb 9.6 oz (103.7 kg).Body mass index is 40.49 kg/m.  General Appearance: Casual  Eye Contact:  Good  Speech:  Clear and Coherent  Volume:  Normal  Mood:  Dysphoric  Affect:  Constricted  Thought Process:  Goal Directed  Orientation:  Full (Time, Place, and Person)  Thought Content: Logical and Rumination   Suicidal Thoughts:  No  Homicidal Thoughts:  No  Memory:  Immediate;   Good Recent;   Good Remote;   Good  Judgement:  Good  Insight:  Good  Psychomotor Activity:  Normal  Concentration:  Concentration: Fair and Attention Span: Fair  Recall:  Good  Fund of Knowledge: Good  Language: Good  Akathisia:  No  Handed:  Right  AIMS  (if indicated): not done  Assets:  Communication Skills Desire for Improvement Housing Social Support  ADL's:  Intact  Cognition: WNL  Sleep:  Fair   Screenings: GAD-7     Counselor from 02/25/2017 in BEHAVIORAL HEALTH PARTIAL HOSPITALIZATION PROGRAM Counselor from 02/11/2017 in BEHAVIORAL HEALTH PARTIAL HOSPITALIZATION PROGRAM Counselor from 04/06/2016 in BEHAVIORAL HEALTH OUTPATIENT CENTER AT [place] Counselor from 01/06/2016 in BEHAVIORAL HEALTH OUTPATIENT CENTER AT [place] Counselor from 10/10/2015 in BEHAVIORAL HEALTH OUTPATIENT CENTER AT [place]  Total GAD-7 Score  20  21  8  19  10     PHQ2-9     Counselor from 04/30/2017 in BEHAVIORAL HEALTH OUTPATIENT CENTER AT [facility] [employer] Counselor  from 02/25/2017 in BEHAVIORAL HEALTH PARTIAL HOSPITALIZATION PROGRAM Counselor from 02/11/2017 in BEHAVIORAL HEALTH PARTIAL HOSPITALIZATION PROGRAM Office Visit from 12/22/2016 in [place] [facility] [place] Counselor from 04/06/2016 in BEHAVIORAL HEALTH OUTPATIENT CENTER AT [place]  PHQ-2 Total Score  2  5  6   0  2  PHQ-9 Total Score  6  19  23   -  10       Assessment and Plan: Major depressive disorder, recurrent.  Post manic stress disorder.  Patient doing better since Prozac increased.  She is tolerating well.  She started group at [facility] and also seeing therapist individually.  She has no more itching.  Recommended to increase Prozac 40 mg to help with residual depression.  Continue Lamictal 150 mg daily and trazodone 300 mg at bedtime.  Discussed medication side effects and benefits.  She will resume work after September 14 when she finished anger management group.  Recommended to call us back if she has any question or any concern.  Follow-up in 2 months.   Rejeana Fadness T., MD 05/18/2017, 4:35 PM

## 2017-05-19 ENCOUNTER — Telehealth (HOSPITAL_COMMUNITY): Payer: Self-pay | Admitting: Licensed Clinical Social Worker

## 2017-05-19 NOTE — Telephone Encounter (Signed)
Therapist spoke with on call physician, Darrel Fleishman as Dr Peterson AoAlbers was not available.  Provided information about patient's condition upon last seen.

## 2017-05-19 NOTE — Telephone Encounter (Signed)
Dr[facility]bers [facility] [employer] is calling in regards to this patients review. Please call him back at [phone]

## 2017-05-20 ENCOUNTER — Ambulatory Visit (INDEPENDENT_AMBULATORY_CARE_PROVIDER_SITE_OTHER): Payer: PRIVATE HEALTH INSURANCE | Admitting: Licensed Clinical Social Worker

## 2017-05-20 DIAGNOSIS — F331 Major depressive disorder, recurrent, moderate: Secondary | ICD-10-CM

## 2017-05-20 DIAGNOSIS — F411 Generalized anxiety disorder: Secondary | ICD-10-CM | POA: Diagnosis not present

## 2017-05-20 DIAGNOSIS — F4310 Post-traumatic stress disorder, unspecified: Secondary | ICD-10-CM | POA: Diagnosis not present

## 2017-05-20 NOTE — Progress Notes (Signed)
THERAPIST PROGRESS NOTE  Session Time: 3:06pm-4:01pm  Participation Level: Active  Behavioral Response:  Casual   Alert  Euthymic  Type of Therapy: Individual Therapy  Treatment Goals addressed:  Reduce symptoms of PTSD  Interventions: CBT  Suicidal/Homicidal: Denied both   Therapist Interventions:   Asked patient about her depressive symptoms and how she has been coping with them.  Asked patient for feedback about using the Thought Journal to increase her awareness of triggers, feelings, thoughts, and coping strategies.      Summary:  Reported that she has been getting out of the house each day.  Noted that this is difficult, but she pushes herself and feels good about doing so.  Working on eating more.  Most nights getting 4 or less hours of sleep.   Reported that she did do some Thought Journal entries.  Noted that the coping strategies she has been using have been pretty effective.  Finds it relaxing to color in her adult coloring book and use her diffuser to create a more calming environment.  Indicated she was able to identify her thoughts and feelings.  Also indicated she was able to analyze her thoughts and work on changing them to be more helpful.   Has continued to avoid interacting with her mother.  Feels as though talking to her would trigger distressing thoughts and feelings.           Plan:  Scheduled to return next week.    Diagnosis:    PTSD Major Depressive Disorder, recurrent, moderate Generalized Anxiety Disorder     Marilu FavreSolomon, [name], LCSW 05/20/2017

## 2017-05-21 ENCOUNTER — Other Ambulatory Visit: Payer: Self-pay | Admitting: Physician Assistant

## 2017-05-21 DIAGNOSIS — E1165 Type 2 diabetes mellitus with hyperglycemia: Secondary | ICD-10-CM

## 2017-05-21 DIAGNOSIS — IMO0002 Reserved for concepts with insufficient information to code with codable children: Secondary | ICD-10-CM

## 2017-05-21 DIAGNOSIS — E118 Type 2 diabetes mellitus with unspecified complications: Principal | ICD-10-CM

## 2017-05-26 ENCOUNTER — Ambulatory Visit (HOSPITAL_COMMUNITY): Payer: Self-pay | Admitting: Licensed Clinical Social Worker

## 2017-05-27 ENCOUNTER — Other Ambulatory Visit (HOSPITAL_COMMUNITY): Payer: Self-pay

## 2017-05-27 DIAGNOSIS — F331 Major depressive disorder, recurrent, moderate: Secondary | ICD-10-CM

## 2017-05-27 DIAGNOSIS — F4310 Post-traumatic stress disorder, unspecified: Secondary | ICD-10-CM

## 2017-05-27 MED ORDER — LAMOTRIGINE 150 MG PO TABS: 150.0000 mg | ORAL_TABLET | Freq: Every day | ORAL | 0 refills | Status: DC

## 2017-05-27 MED ORDER — FLUOXETINE HCL 40 MG PO CAPS: 40.0000 mg | ORAL_CAPSULE | Freq: Every day | ORAL | 0 refills | Status: DC

## 2017-05-28 ENCOUNTER — Telehealth (HOSPITAL_COMMUNITY): Payer: Self-pay | Admitting: Licensed Clinical Social Worker

## 2017-05-28 NOTE — Telephone Encounter (Signed)
Pt called to let you know that she returned to work today, and they will be sending you paperwork.  Pt would also like to discuss something with you.  Please call pt back at [phone]

## 2017-06-01 ENCOUNTER — Other Ambulatory Visit: Payer: Self-pay | Admitting: Physician Assistant

## 2017-06-01 DIAGNOSIS — E118 Type 2 diabetes mellitus with unspecified complications: Principal | ICD-10-CM

## 2017-06-01 DIAGNOSIS — E1165 Type 2 diabetes mellitus with hyperglycemia: Secondary | ICD-10-CM

## 2017-06-01 DIAGNOSIS — IMO0002 Reserved for concepts with insufficient information to code with codable children: Secondary | ICD-10-CM

## 2017-06-02 ENCOUNTER — Ambulatory Visit (INDEPENDENT_AMBULATORY_CARE_PROVIDER_SITE_OTHER): Payer: PRIVATE HEALTH INSURANCE | Admitting: Licensed Clinical Social Worker

## 2017-06-02 DIAGNOSIS — F3341 Major depressive disorder, recurrent, in partial remission: Secondary | ICD-10-CM

## 2017-06-02 DIAGNOSIS — F4310 Post-traumatic stress disorder, unspecified: Secondary | ICD-10-CM | POA: Diagnosis not present

## 2017-06-02 DIAGNOSIS — F411 Generalized anxiety disorder: Secondary | ICD-10-CM | POA: Diagnosis not present

## 2017-06-02 NOTE — Progress Notes (Signed)
THERAPIST PROGRESS NOTE  Session Time: 1:07pm-2:06pm  Participation Level: Active  Behavioral Response:  Casual   Alert  Euthymic  Type of Therapy: Individual Therapy  Treatment Goals addressed:  Reduce symptoms of PTSD  Interventions: Assessment, Emotion regulation  Suicidal/Homicidal: Denied both   Therapist Interventions:   Had patient complete a PHQ9 and GAD7 to assess for severity of depression and anxiety. Asked patient to identify what she believes has helped her to reduce her symptoms. Reviewed the concept of emotion regulation.  Talked about how emotion regulation skills are developed in childhood and are learned based on how caretakers respond to a child's moods and by the child observing how caretakers manage their own moods. Noted there are 3 channels for coping with distress: physiological, cognitive, and behavioral.   Reviewed strategies for calming the body including focused breathing and meditation.  Guided patient through practicing progressive muscle relaxation.  Summary:  Symptoms of depression and anxiety have decreased significantly compared to a couple weeks ago.     [facility] 06/02/2017 04/30/2017 02/25/2017 02/11/2017 12/22/2016  Decreased Interest 0 1 2 3  0  Down, Depressed, Hopeless 0 1 3 3  0  PHQ - 2 Score 0 2 5 6  0  Altered sleeping 1 1 3 3  -  Tired, decreased energy 0 1 2 3  -  Change in appetite 0 1 3 3  -  Feeling bad or failure about yourself  0 0 1 3 -  Trouble concentrating 0 1 2 3  -  Moving slowly or fidgety/restless 1 0 2 0 -  Suicidal thoughts 0 0 1 2 -  PHQ-9 Score 2 6 19 23  -  Difficult doing work/chores - - - - -  Some recent data might be hidden   GAD 7 : Generalized Anxiety Score 06/02/2017 02/25/2017 02/11/2017 04/06/2016  Nervous, Anxious, on Edge 1 3 3 2   Control/stop worrying 1 3 3 1   Worry too much - different things 1 3 3 1   Trouble relaxing 1 3 3 1   Restless 1 3 3 1   Easily annoyed or irritable 0 2 3 2   Afraid - awful  might happen 1 3 3  0  Total GAD 7 Score 6 20 21 8   Anxiety Difficulty Somewhat difficult Extremely difficult Extremely difficult Very difficult   Reported having a visit from her brother and being reassured that he is a source of support.  Also getting consistent support from her boyfriend.  Noted they went on a short vacation to the beach.  Talked about how she has made efforts to shift her focus more to the present rather than dwelling on the past or worrying about the future.   Reflected on how growing up her parents did not encourage expression of emotion.  When she would get upset she typically was told to stop feeling the way she was feeling.   Was not too familiar with progressive muscle relaxation.  After practicing she noted her body felt less tense.      Plan:  Scheduled to return next week.  Will continue with emotion regulation skills   Diagnosis:    PTSD Generalized Anxiety Disorder Major Depressive Disorder, recurrent, in partial remission      Darrin LuisSolomon, Shiya Fogelman A, LCSW 06/02/2017

## 2017-06-07 ENCOUNTER — Ambulatory Visit (HOSPITAL_COMMUNITY): Payer: Self-pay | Admitting: Licensed Clinical Social Worker

## 2017-06-22 ENCOUNTER — Ambulatory Visit (INDEPENDENT_AMBULATORY_CARE_PROVIDER_SITE_OTHER): Payer: PRIVATE HEALTH INSURANCE | Admitting: Licensed Clinical Social Worker

## 2017-06-22 DIAGNOSIS — F4310 Post-traumatic stress disorder, unspecified: Secondary | ICD-10-CM | POA: Diagnosis not present

## 2017-06-22 DIAGNOSIS — F3341 Major depressive disorder, recurrent, in partial remission: Secondary | ICD-10-CM

## 2017-06-22 DIAGNOSIS — F411 Generalized anxiety disorder: Secondary | ICD-10-CM

## 2017-06-22 NOTE — Progress Notes (Signed)
THERAPIST PROGRESS NOTE  Session Time: 1:07pm-2:02pm  Participation Level: Active  Behavioral Response:  Casual   Alert  Euthymic  Type of Therapy: Individual Therapy  Treatment Goals addressed:  Reduce symptoms of PTSD  Interventions: Assessment, Emotion regulation  Suicidal/Homicidal: Denied both   Therapist Interventions:   Discussed patient's return to work and how she has been coping with work stress. Reviewed some cognitive coping strategies for dealing with distressing feelings.  The first one was attention shifting.  The next was positive self-statements.  Reviewed a list of examples of coping statements.  Wrote down a couple that patient said she liked.  Introduced an Advertising account executive.  Encouraged her to practice the exercise for homework.    Summary:  When asked about work she said "It hasn't been as stressful as I anticipated."  Talked about how she is focusing on taking one moment at a time and that seems to help. Reported feeling excited about registering for classes this Saturday.  Looking forward to starting a new chapter in her life in January.  Was able to describe how she has used attention shifting to cope with stressful phone interactions at work.  Reported that she has used some positive self-statements.  She has written them on sticky notes and posted them around her house.   Indicated she is open to practicing the Safe Emergency planning/management officer.  Reported that she will most likely think about the place where her grandmother lived.  Noted this was one of the few places she felt safest growing up.       Plan:  Next time will focus on increasing positive emotions.  Diagnosis:    PTSD Generalized Anxiety Disorder Major Depressive Disorder, recurrent, in partial remission      Darrin Luis 06/22/2017

## 2017-06-27 ENCOUNTER — Other Ambulatory Visit: Payer: Self-pay | Admitting: Physician Assistant

## 2017-06-27 DIAGNOSIS — E118 Type 2 diabetes mellitus with unspecified complications: Principal | ICD-10-CM

## 2017-06-27 DIAGNOSIS — IMO0002 Reserved for concepts with insufficient information to code with codable children: Secondary | ICD-10-CM

## 2017-06-27 DIAGNOSIS — E1165 Type 2 diabetes mellitus with hyperglycemia: Secondary | ICD-10-CM

## 2017-06-29 ENCOUNTER — Telehealth (HOSPITAL_COMMUNITY): Payer: Self-pay | Admitting: Licensed Clinical Social Worker

## 2017-07-06 ENCOUNTER — Ambulatory Visit (HOSPITAL_COMMUNITY): Payer: Self-pay | Admitting: Licensed Clinical Social Worker

## 2017-07-19 ENCOUNTER — Ambulatory Visit (HOSPITAL_COMMUNITY): Payer: Self-pay | Admitting: Psychiatry

## 2017-07-20 ENCOUNTER — Ambulatory Visit (HOSPITAL_COMMUNITY): Payer: Self-pay | Admitting: Licensed Clinical Social Worker

## 2017-07-22 ENCOUNTER — Ambulatory Visit (INDEPENDENT_AMBULATORY_CARE_PROVIDER_SITE_OTHER): Payer: PRIVATE HEALTH INSURANCE | Admitting: Licensed Clinical Social Worker

## 2017-07-22 DIAGNOSIS — F411 Generalized anxiety disorder: Secondary | ICD-10-CM | POA: Diagnosis not present

## 2017-07-22 DIAGNOSIS — F4310 Post-traumatic stress disorder, unspecified: Secondary | ICD-10-CM

## 2017-07-22 DIAGNOSIS — F3341 Major depressive disorder, recurrent, in partial remission: Secondary | ICD-10-CM | POA: Diagnosis not present

## 2017-07-22 NOTE — Telephone Encounter (Signed)
d 

## 2017-07-22 NOTE — Progress Notes (Signed)
THERAPIST PROGRESS NOTE  Session Time: 4:02pm-4:58pm  Participation Level: Active  Behavioral Response:  Casual   Alert  Euthymic  Type of Therapy: Individual Therapy  Treatment Goals addressed:  Reduce symptoms of PTSD  Interventions:   Suicidal/Homicidal: Denied both   Therapist Interventions:   Encouraged patient to consider what brings her pleasure and how she may make those things a bigger part of her life.   Introduced an imagery exercise aimed at evoking feelings of strength and protection. Discussed how it is common for individuals with a history of trauma to numb their emotions, both negative and positive.  Assigned homework to identify positive triggers and the feelings that she associates with them.   Summary:  Indicated she has continued to handle the stresses of work well. Noted it helps to know she will be moving onto other things in the near future. Reported her mood has been "pretty good."  Talked about how she feels like she is doing a good job of not letting triggers consume her.   Indicated she is open to using the coping strategies discussed today.  Admitted that she would like to devote more time towards figuring out what brings her a sense of pleasure.  Also talked about having intentions of working on being more social.  Mentioned she is trying out a Zumba class for the first time this Saturday.        Plan:  Scheduled to return Nov 27th.  May introduce the concept of distress tolerance.  Diagnosis:    PTSD Generalized Anxiety Disorder Major Depressive Disorder, recurrent, in partial remission      Darrin LuisSolomon, [name], LCSW 07/22/2017

## 2017-07-23 ENCOUNTER — Other Ambulatory Visit: Payer: Self-pay | Admitting: Physician Assistant

## 2017-07-23 DIAGNOSIS — I10 Essential (primary) hypertension: Secondary | ICD-10-CM

## 2017-08-09 ENCOUNTER — Encounter (HOSPITAL_COMMUNITY): Payer: Self-pay | Admitting: Psychiatry

## 2017-08-09 ENCOUNTER — Ambulatory Visit (INDEPENDENT_AMBULATORY_CARE_PROVIDER_SITE_OTHER): Payer: PRIVATE HEALTH INSURANCE | Admitting: Psychiatry

## 2017-08-09 DIAGNOSIS — Z818 Family history of other mental and behavioral disorders: Secondary | ICD-10-CM

## 2017-08-09 DIAGNOSIS — Z915 Personal history of self-harm: Secondary | ICD-10-CM

## 2017-08-09 DIAGNOSIS — F331 Major depressive disorder, recurrent, moderate: Secondary | ICD-10-CM

## 2017-08-09 DIAGNOSIS — Z79899 Other long term (current) drug therapy: Secondary | ICD-10-CM | POA: Diagnosis not present

## 2017-08-09 DIAGNOSIS — F4310 Post-traumatic stress disorder, unspecified: Secondary | ICD-10-CM

## 2017-08-09 MED ORDER — FLUOXETINE HCL 40 MG PO CAPS: 40.0000 mg | ORAL_CAPSULE | Freq: Every day | ORAL | 0 refills | Status: DC

## 2017-08-09 MED ORDER — LAMOTRIGINE 150 MG PO TABS: 150.0000 mg | ORAL_TABLET | Freq: Every day | ORAL | 0 refills | Status: DC

## 2017-08-09 NOTE — Progress Notes (Signed)
BH MD/PA/NP OP Progress Note  08/09/2017 8:20 AM [facility]anning[facility]ak[name]  MRN:  [id number]  Chief Complaint: I am doing better.  I do not take any more sleep medication. Chief Complaint    Follow-up     HPI: Patient came for her follow-up appointment.  She is compliant with Prozac and Lamictal.  She is doing much better.  She is no longer taking trazodone because she is sleeping better.  Since we increased the Prozac she is feeling less depressed less anxious.  Her sleep is improving she denies any major issues.  She gets some time flashbacks and nightmares but they are less intense.  Sometimes she feels overwhelmed from the job.  She works at [name] but there has been no recent issues.  Patient denies any agitation, anger, mania or psychosis.  She also was planning to start school in January at [name].  Patient has no rash, itching, tremors or shakes.  She is seeing therapist Brittany SagoSarah at [name].  Her energy level is good.  She lost 4 pounds since the last visit as she is more careful about her appetite.  She wants to continue her current psychiatric medication.  She is thinking that she may need to [name] to take anger management classes.  Patient denies drinking alcohol or using any illegal substances.  Visit Diagnosis:    ICD-10-CM   1. Major depressive disorder, recurrent episode, moderate (HCC) F33.1 lamoTRIgine (LAMICTAL) 150 MG tablet    FLUoxetine (PROZAC) 40 MG capsule  2. PTSD (post-traumatic stress disorder) F43.10 lamoTRIgine (LAMICTAL) 150 MG tablet    Past Psychiatric History: Reviewed. Patient has history of major depression. She had 3 psychiatric hospitalization and 2suicidal attempt by taking overdose on medication. Her last hospitalization was in 2003 at [name]. She took Abilify but stopped after a few months. Her to other psychiatric hospitalization was in 2005 and 2010. She was seeing Dr. Wilmon ArmsAkhtarin [name] and then  briefly seen by Dr. Lenore [name]. She had tried Abilify, trazodone, Lamictal, Zoloft and Vibryd. She has done twice IOP and PHP.  Past Medical History:  Past Medical History:  Diagnosis Date  . Anxiety   . Depression   . Diabetes mellitus without complication (HCC)   . HLD (hyperlipidemia) 01/02/2014  . Hypertension UNSURE    Past Surgical History:  Procedure Laterality Date  . TUBAL LIGATION      Family Psychiatric History: Reviewed.  Family History:  Family History  Problem Relation Age of Onset  . Diabetes Mother   . Hyperlipidemia Mother   . Diabetes Father   . Asthma Son   . Diabetes Paternal Grandmother   . Diabetes Paternal Grandfather   . Kidney disease Paternal Grandfather   . Schizophrenia Maternal Grandmother     Social History:  Social History   Socioeconomic History  . Marital status: Single    Spouse name: None  . Number of children: None  . Years of education: None  . Highest education level: None  Social Needs  . Financial resource strain: None  . Food insecurity - worry: None  . Food insecurity - inability: None  . Transportation needs - medical: None  . Transportation needs - non-medical: None  Occupational History  . None  Tobacco Use  . Smoking status: Never Smoker  . Smokeless tobacco: Never Used  Substance and Sexual Activity  . Alcohol use: No  . Drug use: No  . Sexual activity: Yes    Partners: Male  Birth control/protection: None, Surgical  Other Topics Concern  . None  Social History Narrative  . None    Allergies:  Allergies  Allergen Reactions  . Janumet Xr [Sitagliptin-Metformin Hcl Er]     diarrhea  . Metformin And Related     diarrhea  . Zoloft [Sertraline Hcl]     Headache, palpitations    Metabolic Disorder Labs: Lab Results  Component Value Date   HGBA1C 9.1 12/22/2016   MPG 295 (H) 11/07/2015   MPG 321 (H) 05/16/2014   No results found for: PROLACTIN Lab Results  Component Value Date   CHOL 194  11/07/2015   TRIG 226 (H) 11/07/2015   HDL 49 11/07/2015   CHOLHDL 4.0 11/07/2015   VLDL 45 (H) 11/07/2015   LDLCALC 100 11/07/2015   LDLCALC 109 (H) 05/16/2014   Lab Results  Component Value Date   TSH 1.50 11/07/2015    Therapeutic Level Labs: No results found for: LITHIUM No results found for: VALPROATE No components found for:  CBMZ  Current Medications: Current Outpatient Medications  Medication Sig Dispense Refill  . AMBULATORY NON FORMULARY MEDICATION One Touch Ultra test strips 100 each 1  . dapagliflozin propanediol (FARXIGA) 10 MG TABS tablet Take 10 mg by mouth daily. 30 tablet 2  . FLUoxetine (PROZAC) 40 MG capsule Take 1 capsule (40 mg total) by mouth daily. 90 capsule 0  . glipiZIDE (GLUCOTROL XL) 10 MG 24 hr tablet Take 1 tablet (10 mg total) by mouth daily with breakfast. Due for follow up visit 15 tablet 0  . lamoTRIgine (LAMICTAL) 150 MG tablet Take 1 tablet (150 mg total) by mouth daily. 90 tablet 0  . Lancets (ONETOUCH ULTRASOFT) lancets Use as instructed 100 each 12  . metoprolol succinate (TOPROL-XL) 50 MG 24 hr tablet Take 1 tablet (50 mg total) by mouth daily. Due for follow up visit 15 tablet 0  . traZODone (DESYREL) 100 MG tablet Take 3 tablets (300 mg total) by mouth at bedtime. 90 tablet 1  . VICTOZA 18 MG/3ML SOPN INJECT 0.2 MLS (1.2 MG TOTAL) INTO THE SKIN EVERY MORNING. 18 pen 0   No current facility-administered medications for this visit.      Musculoskeletal: Strength & Muscle Tone: within normal limits Gait & Station: normal Patient leans: Right  Psychiatric Specialty Exam: Review of Systems  Constitutional: Negative.   Skin: Negative for itching and rash.    Blood pressure 126/86, pulse 86, height 5\' 2"  (1.575 m), weight 224 lb (101.6 kg), SpO2 98 %.Body mass index is 40.97 kg/m.  General Appearance: Casual  Eye Contact:  Good  Speech:  Clear and Coherent  Volume:  Normal  Mood:  Euthymic  Affect:  Appropriate  Thought Process:   Goal Directed  Orientation:  Full (Time, Place, and Person)  Thought Content: Logical   Suicidal Thoughts:  No  Homicidal Thoughts:  No  Memory:  Immediate;   Good Recent;   Good Remote;   Good  Judgement:  Good  Insight:  Good  Psychomotor Activity:  Normal  Concentration:  Concentration: Good and Attention Span: Good  Recall:  Good  Fund of Knowledge: Good  Language: Good  Akathisia:  No  Handed:  Right  AIMS (if indicated): not done  Assets:  Communication Skills Desire for Improvement Housing Resilience Social Support  ADL's:  Intact  Cognition: WNL  Sleep:  Good   Screenings: GAD-7     Counselor from 06/02/2017 in BEHAVIORAL HEALTH OUTPATIENT CENTER AT [facility]  from 02/25/2017 in BEHAVIORAL HEALTH PARTIAL HOSPITALIZATION PROGRAM Counselor from 02/11/2017 in BEHAVIORAL HEALTH PARTIAL HOSPITALIZATION PROGRAM Counselor from 04/06/2016 in BEHAVIORAL HEALTH OUTPATIENT CENTER AT [place] Counselor from 01/06/2016 in BEHAVIORAL HEALTH OUTPATIENT CENTER AT [place]  Total GAD-7 Score  6  20  21  8  19     PHQ2-9     Counselor from 06/02/2017 in BEHAVIORAL HEALTH OUTPATIENT CENTER AT [place] Counselor from 04/30/2017 in BEHAVIORAL HEALTH OUTPATIENT CENTER AT [place] Counselor from 02/25/2017 in BEHAVIORAL HEALTH PARTIAL HOSPITALIZATION PROGRAM Counselor from 02/11/2017 in BEHAVIORAL HEALTH PARTIAL HOSPITALIZATION PROGRAM [name] Visit from 12/22/2016 in [facility] [facility] [place]  PHQ-2 Total Score  0  2  5  6   0  PHQ-9 Total Score  2  6  19  23   No data       Assessment and Plan: Major depressive disorder, recurrent.  Posttraumatic stress disorder.  Patient doing better since Prozac increased.  She is tolerating well.  She has no side effects.  She lost a few pounds from the last visit.  She is no longer taking trazodone as sleep is improved.  Continue Lamictal 150 mg daily, Prozac 40 mg daily.  I will discontinue trazodone since patient is  no longer taking it.  Encouraged to continue appointment with [facility] SagoSarah for counseling.  Encouraged healthy lifestyle and watch her calorie intake and do regular exercise.  I also encouraged to resume [name].  Recommended to call us back if he has any question, concerns or if you feel worsening of the symptoms.  Follow-up in 3 months.   Cleotis NipperSyed T Lawerence Dery, MD 08/09/2017, 8:20 AM

## 2017-08-17 ENCOUNTER — Ambulatory Visit (HOSPITAL_COMMUNITY): Payer: Self-pay | Admitting: Licensed Clinical Social Worker

## 2017-09-29 ENCOUNTER — Encounter: Payer: Self-pay | Admitting: Physician Assistant

## 2017-09-29 ENCOUNTER — Ambulatory Visit (INDEPENDENT_AMBULATORY_CARE_PROVIDER_SITE_OTHER): Payer: 59 | Admitting: Physician Assistant

## 2017-09-29 VITALS — BP 147/87 | HR 78 | Temp 97.8°F | Ht 62.0 in | Wt 223.0 lb

## 2017-09-29 DIAGNOSIS — Z79899 Other long term (current) drug therapy: Secondary | ICD-10-CM

## 2017-09-29 DIAGNOSIS — E118 Type 2 diabetes mellitus with unspecified complications: Secondary | ICD-10-CM | POA: Diagnosis not present

## 2017-09-29 DIAGNOSIS — Z23 Encounter for immunization: Secondary | ICD-10-CM

## 2017-09-29 DIAGNOSIS — IMO0002 Reserved for concepts with insufficient information to code with codable children: Secondary | ICD-10-CM

## 2017-09-29 DIAGNOSIS — Z13220 Encounter for screening for lipoid disorders: Secondary | ICD-10-CM | POA: Diagnosis not present

## 2017-09-29 DIAGNOSIS — E1165 Type 2 diabetes mellitus with hyperglycemia: Secondary | ICD-10-CM | POA: Diagnosis not present

## 2017-09-29 DIAGNOSIS — I10 Essential (primary) hypertension: Secondary | ICD-10-CM

## 2017-09-29 DIAGNOSIS — J0100 Acute maxillary sinusitis, unspecified: Secondary | ICD-10-CM

## 2017-09-29 LAB — POCT GLYCOSYLATED HEMOGLOBIN (HGB A1C): Hemoglobin A1C: 12.2

## 2017-09-29 MED ORDER — METOPROLOL SUCCINATE ER 50 MG PO TB24: 50.0000 mg | ORAL_TABLET | Freq: Every day | ORAL | 3 refills | Status: DC

## 2017-09-29 MED ORDER — GLIPIZIDE ER 10 MG PO TB24: 10.0000 mg | ORAL_TABLET | Freq: Every day | ORAL | 0 refills | Status: DC

## 2017-09-29 MED ORDER — LIRAGLUTIDE 18 MG/3ML ~~LOC~~ SOPN: PEN_INJECTOR | SUBCUTANEOUS | 0 refills | Status: DC

## 2017-09-29 MED ORDER — AMBULATORY NON FORMULARY MEDICATION: 1 refills | Status: DC

## 2017-09-29 MED ORDER — AZITHROMYCIN 250 MG PO TABS
ORAL_TABLET | ORAL | 0 refills | Status: DC
Start: 2017-09-29 — End: 2018-02-04

## 2017-09-29 MED ORDER — ERTUGLIFLOZIN-SITAGLIPTIN 15-100 MG PO TABS: 1.0000 | ORAL_TABLET | Freq: Every day | ORAL | 2 refills | Status: DC

## 2017-09-29 MED ORDER — INSULIN DEGLUDEC 100 UNIT/ML ~~LOC~~ SOPN: 10.0000 [IU] | PEN_INJECTOR | Freq: Every day | SUBCUTANEOUS | 2 refills | Status: DC

## 2017-09-29 NOTE — Progress Notes (Signed)
Subjective:    Patient ID: Brittany BanningLakisha [name], female    DOB: 02/28/1976, 42 y.o.   MRN: [id number]  HPI  Pt is a 42 yo obese female who presents to the clinic for DM follow up.   Her last DM visit was in April 2018 and A!C was 9.1. She has not been checking sugars. She has not been waching her diet or exercising. She denies any symptoms of hypoglycemia. She is taking her victoza daily. She admits to running out of glipizide a few weeks ago. She denies any open sores or wounds.   She also has 10 days of sinus pressure, congestion, drainage. Tried tylenol cold and sinus severe. No improvement. No fever, chills, or body aches.   HTN- no CP, palpitations, headaches. Taking metoprolol.   .. Active Ambulatory Problems    Diagnosis Date Noted  . Glucosuria 01/01/2014  . HTN (hypertension) 01/01/2014  . HLD (hyperlipidemia) 01/02/2014  . Elevated liver function tests 01/02/2014  . Nausea, vomiting, and diarrhea 01/05/2014  . Uncontrolled type 2 diabetes mellitus with complication, without long-term current use of insulin (HCC) 05/01/2014  . Hepatic steatosis 05/16/2014  . PTSD (post-traumatic stress disorder) 01/04/2015  . Severe recurrent major depression without psychotic features (HCC) 01/23/2015   Resolved Ambulatory Problems    Diagnosis Date Noted  . Depression 12/27/2014  . Bipolar disorder, current episode depressed, severe, without psychotic features (HCC) 01/04/2015   Past Medical History:  Diagnosis Date  . Anxiety   . Depression   . Diabetes mellitus without complication (HCC)   . HLD (hyperlipidemia) 01/02/2014  . Hypertension UNSURE       Review of Systems  All other systems reviewed and are negative.      Objective:   Physical Exam  Constitutional: She is oriented to person, place, and time. She appears well-developed and well-nourished.  Obese.   HENT:  Head: Normocephalic and atraumatic.  Right Ear: External ear normal.  Left Ear: External ear normal.   Mouth/Throat: Oropharynx is clear and moist. No oropharyngeal exudate.  TM"s erythematous.  Oropharynx erythematous without exudate or swollen tonsils.   Eyes: Conjunctivae are normal. Right eye exhibits no discharge. Left eye exhibits no discharge.  Neck: Normal range of motion. Neck supple.  Cardiovascular: Normal rate, regular rhythm and normal heart sounds.  Pulmonary/Chest: Effort normal and breath sounds normal. She has no wheezes.  Lymphadenopathy:    She has cervical adenopathy.  Neurological: She is alert and oriented to person, place, and time.  Psychiatric: She has a normal mood and affect. Her behavior is normal.          Assessment & Plan:  Marland Kitchen.Marland Kitchen.[name] was seen today for diabetes.  Diagnoses and all orders for this visit:  Uncontrolled type 2 diabetes mellitus with complication, without long-term current use of insulin (HCC) -     POCT HgB A1C -     Flu Vaccine QUAD 6+ mos PF IM (Fluarix Quad PF) -     Ertugliflozin-Sitagliptin (STEGLUJAN) 15-100 MG TABS; Take 1 tablet by mouth daily. -     insulin degludec (TRESIBA) 100 UNIT/ML SOPN FlexTouch Pen; Inject 0.1 mLs (10 Units total) into the skin daily at 10 pm. -     liraglutide (VICTOZA) 18 MG/3ML SOPN; INJECT 0.2 MLS (1.2 MG TOTAL) INTO THE SKIN EVERY MORNING. -     glipiZIDE (GLUCOTROL XL) 10 MG 24 hr tablet; Take 1 tablet (10 mg total) by mouth daily with breakfast. -     AMBULATORY NON  FORMULARY MEDICATION; One Touch Ultra test strips  Essential hypertension -     metoprolol succinate (TOPROL-XL) 50 MG 24 hr tablet; Take 1 tablet (50 mg total) by mouth daily.  Screening for lipid disorders -     Lipid Panel w/reflex Direct LDL  Medication management -     COMPLETE METABOLIC PANEL WITH GFR  Acute non-recurrent maxillary sinusitis -     azithromycin (ZITHROMAX) 250 MG tablet; 2 tablets now and then one tablet for 4 days.   .. Lab Results  Component Value Date   HGBA1C 12.2 09/29/2017   Uncontrolled at  this point.  Continue victoza and glipizide.  Added steglujan and tresibia.  Pt has to start checking fasting sugars and keep log.  Discussed taper up from 10 units as checking fasting sugars by 2 unis every 3-4 days until fasting sugars between 100 and 120.  BP not controlled but she is also sick today.  Lipid panel ordered.   Discussed need for ACE/ARB and statin. At this time she would like to hold off. Pt aware of risk.   Encouraged diabetic diet.  Follow up in 3 monhs.  Long discussion about importance of controlling DM and compliance with medications.   Treated sinusitis with zpak. Discussed symptomatic care. Follow up as needed.   Marland Kitchen.Spent 30 minutes with patient and greater than 50 percent of visit spent counseling patient regarding treatment plan.

## 2017-09-29 NOTE — Patient Instructions (Signed)
Start tresbia 10 units at night and increase by 2 units ever 3-4 days until fasting glucose is 100-120.  Start steglujan daily. Activate card first.    Sinusitis, Adult Sinusitis is soreness and inflammation of your sinuses. Sinuses are hollow spaces in the bones around your face. They are located:  Around your eyes.  In the middle of your forehead.  Behind your nose.  In your cheekbones.  Your sinuses and nasal passages are lined with a stringy fluid (mucus). Mucus normally drains out of your sinuses. When your nasal tissues get inflamed or swollen, the mucus can get trapped or blocked so air cannot flow through your sinuses. This lets bacteria, viruses, and funguses grow, and that leads to infection. Follow these instructions at home: Medicines  Take, use, or apply over-the-counter and prescription medicines only as told by your doctor. These may include nasal sprays.  If you were prescribed an antibiotic medicine, take it as told by your doctor. Do not stop taking the antibiotic even if you start to feel better. Hydrate and Humidify  Drink enough water to keep your pee (urine) clear or pale yellow.  Use a cool mist humidifier to keep the humidity level in your home above 50%.  Breathe in steam for 10-15 minutes, 3-4 times a day or as told by your doctor. You can do this in the bathroom while a hot shower is running.  Try not to spend time in cool or dry air. Rest  Rest as much as possible.  Sleep with your head raised (elevated).  Make sure to get enough sleep each night. General instructions  Put a warm, moist washcloth on your face 3-4 times a day or as told by your doctor. This will help with discomfort.  Wash your hands often with soap and water. If there is no soap and water, use hand sanitizer.  Do not smoke. Avoid being around people who are smoking (secondhand smoke).  Keep all follow-up visits as told by your doctor. This is important. Contact a doctor  if:  You have a fever.  Your symptoms get worse.  Your symptoms do not get better within 10 days. Get help right away if:  You have a very bad headache.  You cannot stop throwing up (vomiting).  You have pain or swelling around your face or eyes.  You have trouble seeing.  You feel confused.  Your neck is stiff.  You have trouble breathing. This information is not intended to replace advice given to you by your health care provider. Make sure you discuss any questions you have with your health care provider. Document Released: 02/24/2008 Document Revised: 05/03/2016 Document Reviewed: 07/03/2015 Elsevier Interactive Patient Education  [employer]2018 Elsevier Inc.

## 2017-10-05 ENCOUNTER — Ambulatory Visit (INDEPENDENT_AMBULATORY_CARE_PROVIDER_SITE_OTHER): Payer: PRIVATE HEALTH INSURANCE | Admitting: Licensed Clinical Social Worker

## 2017-10-05 DIAGNOSIS — F3342 Major depressive disorder, recurrent, in full remission: Secondary | ICD-10-CM

## 2017-10-05 NOTE — Progress Notes (Signed)
THERAPIST PROGRESS NOTE  Session Time: 4:08pm-5:00pm  Participation Level: Active  Behavioral Response:  Casual   Alert  Euthymic  Type of Therapy: Individual Therapy  Treatment Goals addressed:  Reduce symptoms of PTSD  Interventions: Assessment, Treatment plan review  Suicidal/Homicidal: Denied both   Therapist Interventions:   Had patient complete a PHQ9, GAD7, and PCL-5 to assess for symptoms of depression, anxiety, and PTSD-related symptoms.  Asked patient to consider what has helped her to achieve recovery. Determined she has achieved her treatment goals.  Discussed preparing for termination from therapy.        Summary:  Reported she started school last week.  It is going well.  Planning to quit working at [employer] around the end of February.  Hasn't worked much this month because she is using up her vacation time.  Talked about how it feels so good not to have that stress in her life.  She said "I don't feel trapped anymore."  Thinks getting out of that environment was the biggest motivation for her to recover.  Reflected on how she had been stuck in the mindset of "things are never going to improve" to one where positive change is possible.     No longer feeling depressed or anxious.  She said "I feel energized.  I laugh more.  I'm exercising more and getting out more."   [facility] 10/05/2017 06/02/2017 04/30/2017 02/25/2017 02/11/2017  Decreased Interest 0 0 1 2 3   Down, Depressed, Hopeless 0 0 1 3 3   PHQ - 2 Score 0 0 2 5 6   Altered sleeping - 1 1 3 3   Tired, decreased energy - 0 1 2 3   Change in appetite - 0 1 3 3   Feeling bad or failure about yourself  - 0 0 1 3  Trouble concentrating - 0 1 2 3   Moving slowly or fidgety/restless - 1 0 2 0  Suicidal thoughts - 0 0 1 2  PHQ-9 Score - 2 6 19 23   Difficult doing work/chores - - - - -  Some recent data might be hidden      GAD 7 : Generalized Anxiety Score 10/05/2017 06/02/2017 02/25/2017 02/11/2017  Nervous,  Anxious, on Edge 0 1 3 3   Control/stop worrying 0 1 3 3   Worry too much - different things 0 1 3 3   Trouble relaxing 0 1 3 3   Restless 0 1 3 3   Easily annoyed or irritable 0 0 2 3  Afraid - awful might happen 0 1 3 3   Total GAD 7 Score 0 6 20 21   Anxiety Difficulty Not difficult at all Somewhat difficult Extremely difficult Extremely difficult   Denied experiencing any PTSD-related symptoms in the past month.          Plan:  Wants to meet with this therapist once a month until her insurance benefits expire to make sure she doesn't experience a relapse of symptoms      Diagnosis:    Major Depressive Disorder, recurrent, in full remission       Darrin LuisSolomon, [name], LCSW 10/05/2017

## 2017-10-07 ENCOUNTER — Telehealth: Payer: Self-pay | Admitting: *Deleted

## 2017-10-07 NOTE — Telephone Encounter (Signed)
Pre Authorization sent to cover my meds. [id number]  I think this will be denied. Alternatives may be Januvia, Franceradjenta with Docia BarrierFarxiga, Invokana

## 2017-10-08 NOTE — Telephone Encounter (Signed)
This medication has been denied. Can approved when patient has a contraindication to all alternative  Or if complete valid documentation is provided of patient trying and having an inadequate treatment response or intolerance to the required number of formulary alternatives. Alternatives are: januvia,tradjenta with farxiga,Invokana, trial and failure of 3 or more in a class with at least 3 alternatives,2 in a class with 2 alternative or 1 in a class with only 1 alternative.

## 2017-10-11 ENCOUNTER — Other Ambulatory Visit: Payer: Self-pay | Admitting: Physician Assistant

## 2017-10-11 MED ORDER — DAPAGLIFLOZIN PROPANEDIOL 10 MG PO TABS: 10.0000 mg | ORAL_TABLET | Freq: Every day | ORAL | 2 refills | Status: DC

## 2017-10-11 NOTE — Telephone Encounter (Signed)
Ok let patient know that I sent farxiga to pharmacy to see if insurance will cover.

## 2017-10-11 NOTE — Progress Notes (Signed)
farxiga to replace stelujan.

## 2017-10-14 ENCOUNTER — Telehealth: Payer: Self-pay | Admitting: Physician Assistant

## 2017-10-14 NOTE — Telephone Encounter (Signed)
Received fax for pa on one touch ultra blue strips sent through cover my meds waiting on authorization. - CF

## 2017-10-22 ENCOUNTER — Other Ambulatory Visit: Payer: Self-pay | Admitting: *Deleted

## 2017-10-22 DIAGNOSIS — E118 Type 2 diabetes mellitus with unspecified complications: Principal | ICD-10-CM

## 2017-10-22 DIAGNOSIS — E1165 Type 2 diabetes mellitus with hyperglycemia: Secondary | ICD-10-CM

## 2017-10-22 DIAGNOSIS — IMO0002 Reserved for concepts with insufficient information to code with codable children: Secondary | ICD-10-CM

## 2017-10-22 MED ORDER — AMBULATORY NON FORMULARY MEDICATION: 1 refills | Status: DC

## 2017-11-03 ENCOUNTER — Ambulatory Visit (INDEPENDENT_AMBULATORY_CARE_PROVIDER_SITE_OTHER): Payer: PRIVATE HEALTH INSURANCE | Admitting: Licensed Clinical Social Worker

## 2017-11-03 DIAGNOSIS — F330 Major depressive disorder, recurrent, mild: Secondary | ICD-10-CM

## 2017-11-03 NOTE — Progress Notes (Signed)
THERAPIST PROGRESS NOTE  Session Time: 1:10pm-2:05pm  Participation Level: Active  Behavioral Response:  Casual   Alert  A little sad  Type of Therapy: Individual Therapy  Treatment Goals addressed:  Reduce symptoms of PTSD  Interventions: Normalizing her emotional response, weighing pros and cons  Suicidal/Homicidal: Denied both   Therapist Interventions:  Discussed how patient coped with being triggered while at work.  Discussed how when you are triggered by stimuli that is associated with past trauma your response is automatic so it is beneficial for you to develop an attitude of acceptance that you don't have control of being triggered.  Pointed out that one thing she does have control of is her environment.  Discussed the option of not returning to work so that she doesn't have to expose herself to potential triggers.          Summary:  Reported feeling more anxious and depressed in the past few weeks.  Those feelings worsened after an incident at work where a customer yelled at her and cussed her out.  Noted she has an appointment scheduled with her psychiatrist next week and she plans to ask him about adjusting her meds.          Plan:  Plans to schedule one last appointment with this therapist.  Diagnosis:    Major Depressive Disorder, recurrent, mild      Darrin LuisSolomon, [name], LCSW 11/03/2017

## 2017-11-09 ENCOUNTER — Encounter (HOSPITAL_COMMUNITY): Payer: Self-pay | Admitting: Psychiatry

## 2017-11-09 ENCOUNTER — Ambulatory Visit (INDEPENDENT_AMBULATORY_CARE_PROVIDER_SITE_OTHER): Payer: PRIVATE HEALTH INSURANCE | Admitting: Psychiatry

## 2017-11-09 DIAGNOSIS — Z5689 Other problems related to employment: Secondary | ICD-10-CM | POA: Diagnosis not present

## 2017-11-09 DIAGNOSIS — G4700 Insomnia, unspecified: Secondary | ICD-10-CM | POA: Diagnosis not present

## 2017-11-09 DIAGNOSIS — Z818 Family history of other mental and behavioral disorders: Secondary | ICD-10-CM | POA: Diagnosis not present

## 2017-11-09 DIAGNOSIS — F4310 Post-traumatic stress disorder, unspecified: Secondary | ICD-10-CM | POA: Diagnosis not present

## 2017-11-09 DIAGNOSIS — F419 Anxiety disorder, unspecified: Secondary | ICD-10-CM

## 2017-11-09 DIAGNOSIS — F331 Major depressive disorder, recurrent, moderate: Secondary | ICD-10-CM

## 2017-11-09 DIAGNOSIS — Z915 Personal history of self-harm: Secondary | ICD-10-CM

## 2017-11-09 DIAGNOSIS — R450 Nervousness: Secondary | ICD-10-CM

## 2017-11-09 MED ORDER — FLUOXETINE HCL 40 MG PO CAPS: 40.0000 mg | ORAL_CAPSULE | Freq: Every day | ORAL | 0 refills | Status: DC

## 2017-11-09 MED ORDER — TRAZODONE HCL 100 MG PO TABS: 300.0000 mg | ORAL_TABLET | Freq: Every day | ORAL | 1 refills | Status: DC

## 2017-11-09 MED ORDER — LAMOTRIGINE 200 MG PO TABS: 200.0000 mg | ORAL_TABLET | Freq: Every day | ORAL | 0 refills | Status: DC

## 2017-11-09 NOTE — Progress Notes (Signed)
BH MD/PA/NP OP Progress Note  11/09/2017 2:37 PM Brittany BanningLakisha [name]  MRN:  [id number]  Chief Complaint: I was doing fine until 2 weeks ago I have a incident at work and my PTSD got worse.  HPI: Patient came for her follow-up appointment.  She is taking Lamictal Prozac as prescribed.  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  Since then she started to have nightmares and flashback.  She started taking trazodone for insomnia.  She still feels very nervous and anxious and especially at night having racing thoughts.  She like to go up on Lamictal.  She feels the Lamictal helping but she can take advantage of increased dose.  She is seeing Maralyn SagoSarah once a month for therapy.  Patient denies any agitation, anger, mania, psychosis but having flashback and nightmares.  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  She has given notice to [employer] and her last day will be end of the March.  She is not sure what she will do but she is hoping to get a part-time job.  Her energy level is good.  She has no tremors, shakes, rash or any itching.  Her vital signs are stable.  Patient denies drinking alcohol or using any illegal substances.  She has good holidays.  She went to [place] to visit her boyfriend's family.  Visit Diagnosis:    ICD-10-CM   1. Major depressive disorder, recurrent episode, moderate (HCC) F33.1 traZODone (DESYREL) 100 MG tablet    lamoTRIgine (LAMICTAL) 200 MG tablet    FLUoxetine (PROZAC) 40 MG capsule  2. PTSD (post-traumatic stress disorder) F43.10 traZODone (DESYREL) 100 MG tablet    lamoTRIgine (LAMICTAL) 200 MG tablet    Past Psychiatric History: Reviewed. Patient has history of major depression. She had 3 psychiatric hospitalization and 2suicidal attempt by taking overdose on medication. Her last hospitalization was in 2003 at [place]. She took Abilify but stopped after a few  months. Her to other psychiatric hospitalization was in 2005 and 2010. She was seeing Dr. Wilmon ArmsAkhtarin [place] and then briefly seen by Dr. Lenore CordiaAkintyo. She had tried Abilify, trazodone, Lamictal, Zoloft and Vibryd. She has done twice IOP and PHP.  Past Medical History:  Past Medical History:  Diagnosis Date  . Anxiety   . Depression   . Diabetes mellitus without complication (HCC)   . HLD (hyperlipidemia) 01/02/2014  . Hypertension UNSURE    Past Surgical History:  Procedure Laterality Date  . TUBAL LIGATION      Family Psychiatric History: Reviewed.  Family History:  Family History  Problem Relation Age of Onset  . Diabetes Mother   . Hyperlipidemia Mother   . Diabetes Father   . Asthma Son   . Diabetes Paternal Grandmother   . Diabetes Paternal Grandfather   . Kidney disease Paternal Grandfather   . Schizophrenia Maternal Grandmother     Social History:  Social History   Socioeconomic History  . Marital status: Single    Spouse name: None  . Number of children: None  . Years of education: None  . Highest education level: None  Social Needs  . Financial resource strain: None  . Food insecurity - worry: None  . Food insecurity - inability: None  . Transportation needs - medical: None  . Transportation needs - non-medical: None  Occupational History  . None  Tobacco Use  . Smoking status: Never Smoker  .  Smokeless tobacco: Never Used  Substance and Sexual Activity  . Alcohol use: No  . Drug use: No  . Sexual activity: Yes    Partners: Male    Birth control/protection: None, Surgical  Other Topics Concern  . None  Social History Narrative  . None    Allergies:  Allergies  Allergen Reactions  . Janumet Xr [Sitagliptin-Metformin Hcl Er]     diarrhea  . Metformin And Related     diarrhea  . Zoloft [Sertraline Hcl]     Headache, palpitations    Metabolic Disorder Labs: Lab Results  Component Value Date   HGBA1C 12.2 09/29/2017   MPG 295 (H)  11/07/2015   MPG 321 (H) 05/16/2014   No results found for: PROLACTIN Lab Results  Component Value Date   CHOL 194 11/07/2015   TRIG 226 (H) 11/07/2015   HDL 49 11/07/2015   CHOLHDL 4.0 11/07/2015   VLDL 45 (H) 11/07/2015   LDLCALC 100 11/07/2015   LDLCALC 109 (H) 05/16/2014   Lab Results  Component Value Date   TSH 1.50 11/07/2015    Therapeutic Level Labs: No results found for: LITHIUM No results found for: VALPROATE No components found for:  CBMZ  Current Medications: Current Outpatient Medications  Medication Sig Dispense Refill  . AMBULATORY NON FORMULARY MEDICATION Glucometer, test strips, and lancets.  Ok to fill whatever insurance prefers. Dx: E11.9 100 each 1  . azithromycin (ZITHROMAX) 250 MG tablet 2 tablets now and then one tablet for 4 days. 6 tablet 0  . dapagliflozin propanediol (FARXIGA) 10 MG TABS tablet Take 10 mg by mouth daily. 30 tablet 2  . Ertugliflozin-Sitagliptin (STEGLUJAN) 15-100 MG TABS Take 1 tablet by mouth daily. 30 tablet 2  . FLUoxetine (PROZAC) 40 MG capsule Take 1 capsule (40 mg total) daily by mouth. 90 capsule 0  . glipiZIDE (GLUCOTROL XL) 10 MG 24 hr tablet Take 1 tablet (10 mg total) by mouth daily with breakfast. 90 tablet 0  . insulin degludec (TRESIBA) 100 UNIT/ML SOPN FlexTouch Pen Inject 0.1 mLs (10 Units total) into the skin daily at 10 pm. 2 pen 2  . lamoTRIgine (LAMICTAL) 150 MG tablet Take 1 tablet (150 mg total) daily by mouth. 90 tablet 0  . Lancets (ONETOUCH ULTRASOFT) lancets Use as instructed 100 each 12  . liraglutide (VICTOZA) 18 MG/3ML SOPN INJECT 0.2 MLS (1.2 MG TOTAL) INTO THE SKIN EVERY MORNING. 18 pen 0  . metoprolol succinate (TOPROL-XL) 50 MG 24 hr tablet Take 1 tablet (50 mg total) by mouth daily. 90 tablet 3  . traZODone (DESYREL) 100 MG tablet Take 3 tablets (300 mg total) by mouth at bedtime. 90 tablet 1   No current facility-administered medications for this visit.      Musculoskeletal: Strength & Muscle  Tone: within normal limits Gait & Station: normal Patient leans: N/A  Psychiatric Specialty Exam: Review of Systems  Skin: Negative for itching and rash.  Psychiatric/Behavioral: The patient is nervous/anxious.     Blood pressure 121/82, pulse 96, height 5\' 2"  (1.575 m), weight 226 lb (102.5 kg).Body mass index is 41.34 kg/m.  General Appearance: Casual  Eye Contact:  Good  Speech:  Slow  Volume:  Normal  Mood:  Anxious  Affect:  Appropriate  Thought Process:  Goal Directed  Orientation:  Full (Time, Place, and Person)  Thought Content: Rumination   Suicidal Thoughts:  No  Homicidal Thoughts:  No  Memory:  Immediate;   Good Recent;   Good Remote;  Good  Judgement:  Good  Insight:  Good  Psychomotor Activity:  Normal  Concentration:  Concentration: Fair and Attention Span: Fair  Recall:  Good  Fund of Knowledge: Good  Language: Good  Akathisia:  No  Handed:  Right  AIMS (if indicated): not done  Assets:  Communication Skills Desire for Improvement Housing Resilience  ADL's:  Intact  Cognition: WNL  Sleep:  Fair   Screenings: GAD-7     Counselor from 10/05/2017 in [facility] AT [place] Counselor from 06/02/2017 in BEHAVIORAL HEALTH OUTPATIENT CENTER AT [place] Counselor from 02/25/2017 in BEHAVIORAL HEALTH PARTIAL HOSPITALIZATION PROGRAM Counselor from 02/11/2017 in BEHAVIORAL HEALTH PARTIAL HOSPITALIZATION PROGRAM Counselor from 04/06/2016 in BE[facility]AVIORAL HEALTH OUTPATIENT CENTER AT [place]  Total GAD-7 Score  0  6  20  21  8     PHQ2-9     Counselor from 10/05/2017 in BEHAVIORAL HEALTH OUTPATIENT CENTER AT [place] Counselor from 06/02/2017 in BEHAVIORAL HEALTH OUTPATIENT CENTER AT [place] Counselor from 04/30/2017 in BEHAVIORAL HEALTH OUTPATIENT CENTER AT [place] Counselor from 02/25/2017 in BEHAVIORAL HEALTH PARTIAL HOSPITALIZATION PROGRAM Counselor from 02/11/2017 in BEHAVIORAL HEALTH PARTIAL HOSPITALIZATION PROGRAM  PHQ-2  Total Score  0  0  2  5  6   PHQ-9 Total Score  No data  2  6  19  23        Assessment and Plan: Depressive disorder, recurrent.  Posttraumatic stress disorder.  Reassurance given.  Patient continues to have nightmares and flashback which was triggered by a recent incident at work.  She is relieved that she is quitting [employer].  She is hoping to get a part-time job.  She is not sure where she will get her prescription in the future if she do not have insurance.  We discussed risk of noncompliance with medication can cause relapse.  We discussed other possible options including [facility] and [facility] for medication management.  I would also try increasing Lamictal 200 mg daily to help her symptoms.  Continue Prozac 40 mg daily and resume trazodone 100 mg at bedtime.  Encourage to keep appointment with Maralyn Sago for CBT.  Discussed healthy lifestyle.  Also discussed consider anger management at [facility].  Recommended to call us back if she has any question or any concern.  Follow-up in 3 months.     Cleotis Nipper, MD 11/09/2017, 2:37 PM

## 2017-11-29 ENCOUNTER — Ambulatory Visit (HOSPITAL_COMMUNITY): Payer: Self-pay | Admitting: Licensed Clinical Social Worker

## 2017-12-08 ENCOUNTER — Ambulatory Visit (HOSPITAL_COMMUNITY): Payer: Self-pay | Admitting: Licensed Clinical Social Worker

## 2017-12-11 LAB — HM DIABETES EYE EXAM

## 2017-12-16 ENCOUNTER — Encounter: Payer: Self-pay | Admitting: Physician Assistant

## 2017-12-21 ENCOUNTER — Ambulatory Visit (HOSPITAL_COMMUNITY): Payer: Self-pay | Admitting: Licensed Clinical Social Worker

## 2017-12-28 ENCOUNTER — Ambulatory Visit: Payer: Self-pay | Admitting: Physician Assistant

## 2017-12-28 DIAGNOSIS — Z0189 Encounter for other specified special examinations: Secondary | ICD-10-CM

## 2018-01-01 ENCOUNTER — Other Ambulatory Visit: Payer: Self-pay | Admitting: Physician Assistant

## 2018-01-01 DIAGNOSIS — E1165 Type 2 diabetes mellitus with hyperglycemia: Secondary | ICD-10-CM

## 2018-01-01 DIAGNOSIS — E118 Type 2 diabetes mellitus with unspecified complications: Principal | ICD-10-CM

## 2018-01-01 DIAGNOSIS — IMO0002 Reserved for concepts with insufficient information to code with codable children: Secondary | ICD-10-CM

## 2018-01-25 ENCOUNTER — Other Ambulatory Visit: Payer: Self-pay | Admitting: Physician Assistant

## 2018-01-25 DIAGNOSIS — E118 Type 2 diabetes mellitus with unspecified complications: Principal | ICD-10-CM

## 2018-01-25 DIAGNOSIS — IMO0002 Reserved for concepts with insufficient information to code with codable children: Secondary | ICD-10-CM

## 2018-01-25 DIAGNOSIS — E1165 Type 2 diabetes mellitus with hyperglycemia: Secondary | ICD-10-CM

## 2018-02-04 ENCOUNTER — Other Ambulatory Visit: Payer: Self-pay | Admitting: *Deleted

## 2018-02-08 ENCOUNTER — Ambulatory Visit (HOSPITAL_COMMUNITY): Payer: PRIVATE HEALTH INSURANCE | Admitting: Psychiatry

## 2018-04-04 ENCOUNTER — Telehealth: Payer: Self-pay | Admitting: Physician Assistant

## 2018-04-04 NOTE — Telephone Encounter (Signed)
Patient calls and states she hasn't been feeling well and eating a lot of soups and her BP today is 170/108.  Returned patient call - got VM and LM to call office and needs to schedule appointment with PA or MD. KG LPN

## 2018-04-20 ENCOUNTER — Encounter: Payer: BLUE CROSS/BLUE SHIELD | Admitting: Physician Assistant

## 2018-04-23 ENCOUNTER — Other Ambulatory Visit: Payer: Self-pay | Admitting: Physician Assistant

## 2018-04-23 DIAGNOSIS — E118 Type 2 diabetes mellitus with unspecified complications: Principal | ICD-10-CM

## 2018-04-23 DIAGNOSIS — IMO0002 Reserved for concepts with insufficient information to code with codable children: Secondary | ICD-10-CM

## 2018-04-23 DIAGNOSIS — E1165 Type 2 diabetes mellitus with hyperglycemia: Secondary | ICD-10-CM

## 2018-05-30 ENCOUNTER — Other Ambulatory Visit: Payer: Self-pay | Admitting: Internal Medicine

## 2018-05-30 DIAGNOSIS — Z1231 Encounter for screening mammogram for malignant neoplasm of breast: Secondary | ICD-10-CM

## 2018-06-03 ENCOUNTER — Encounter: Payer: BLUE CROSS/BLUE SHIELD | Admitting: Physician Assistant

## 2018-07-14 ENCOUNTER — Ambulatory Visit: Payer: Self-pay

## 2018-07-14 ENCOUNTER — Other Ambulatory Visit: Payer: Self-pay | Admitting: Physician Assistant

## 2018-07-14 ENCOUNTER — Ambulatory Visit: Payer: BLUE CROSS/BLUE SHIELD | Attending: Internal Medicine | Admitting: Internal Medicine

## 2018-07-14 ENCOUNTER — Encounter: Payer: Self-pay | Admitting: Internal Medicine

## 2018-07-14 ENCOUNTER — Ambulatory Visit: Payer: BLUE CROSS/BLUE SHIELD | Admitting: Obstetrics & Gynecology

## 2018-07-14 VITALS — BP 167/114 | HR 70 | Temp 97.8°F | Resp 16 | Ht 63.0 in | Wt 232.2 lb

## 2018-07-14 DIAGNOSIS — IMO0002 Reserved for concepts with insufficient information to code with codable children: Secondary | ICD-10-CM

## 2018-07-14 DIAGNOSIS — Z6841 Body Mass Index (BMI) 40.0 and over, adult: Secondary | ICD-10-CM | POA: Diagnosis not present

## 2018-07-14 DIAGNOSIS — E118 Type 2 diabetes mellitus with unspecified complications: Secondary | ICD-10-CM

## 2018-07-14 DIAGNOSIS — Z79899 Other long term (current) drug therapy: Secondary | ICD-10-CM | POA: Insufficient documentation

## 2018-07-14 DIAGNOSIS — Z23 Encounter for immunization: Secondary | ICD-10-CM

## 2018-07-14 DIAGNOSIS — F4310 Post-traumatic stress disorder, unspecified: Secondary | ICD-10-CM | POA: Diagnosis not present

## 2018-07-14 DIAGNOSIS — Z794 Long term (current) use of insulin: Secondary | ICD-10-CM | POA: Insufficient documentation

## 2018-07-14 DIAGNOSIS — IMO0001 Reserved for inherently not codable concepts without codable children: Secondary | ICD-10-CM

## 2018-07-14 DIAGNOSIS — E1165 Type 2 diabetes mellitus with hyperglycemia: Secondary | ICD-10-CM

## 2018-07-14 DIAGNOSIS — I10 Essential (primary) hypertension: Secondary | ICD-10-CM | POA: Diagnosis not present

## 2018-07-14 DIAGNOSIS — E119 Type 2 diabetes mellitus without complications: Secondary | ICD-10-CM | POA: Diagnosis not present

## 2018-07-14 DIAGNOSIS — Z833 Family history of diabetes mellitus: Secondary | ICD-10-CM | POA: Diagnosis not present

## 2018-07-14 DIAGNOSIS — Z8249 Family history of ischemic heart disease and other diseases of the circulatory system: Secondary | ICD-10-CM | POA: Insufficient documentation

## 2018-07-14 DIAGNOSIS — F332 Major depressive disorder, recurrent severe without psychotic features: Secondary | ICD-10-CM | POA: Diagnosis not present

## 2018-07-14 DIAGNOSIS — K760 Fatty (change of) liver, not elsewhere classified: Secondary | ICD-10-CM | POA: Insufficient documentation

## 2018-07-14 DIAGNOSIS — E785 Hyperlipidemia, unspecified: Secondary | ICD-10-CM | POA: Insufficient documentation

## 2018-07-14 LAB — POCT GLYCOSYLATED HEMOGLOBIN (HGB A1C): HbA1c, POC (controlled diabetic range): 10.2 % — AB (ref 0.0–7.0)

## 2018-07-14 LAB — GLUCOSE, POCT (MANUAL RESULT ENTRY): POC Glucose: 193 mg/dl — AB (ref 70–99)

## 2018-07-14 MED ORDER — AMLODIPINE BESYLATE 5 MG PO TABS: 5.0000 mg | ORAL_TABLET | Freq: Every day | ORAL | 3 refills | Status: DC

## 2018-07-14 MED ORDER — HYDROCHLOROTHIAZIDE 12.5 MG PO TABS: 12.5000 mg | ORAL_TABLET | Freq: Every day | ORAL | 6 refills | Status: DC

## 2018-07-14 MED ORDER — TETANUS-DIPHTH-ACELL PERTUSSIS 5-2.5-18.5 LF-MCG/0.5 IM SUSP: 0.5000 mL | Freq: Once | INTRAMUSCULAR | 0 refills | Status: AC

## 2018-07-14 MED ORDER — LIRAGLUTIDE 18 MG/3ML ~~LOC~~ SOPN: PEN_INJECTOR | SUBCUTANEOUS | 5 refills | Status: DC

## 2018-07-14 NOTE — Telephone Encounter (Signed)
This refill request came through today for [facility], I saw that she has not been seen in the office for a while, so I just wanted to be sure about refilling meds. Thanks!

## 2018-07-14 NOTE — Progress Notes (Signed)
New patient

## 2018-07-14 NOTE — Patient Instructions (Addendum)
Please give appointment with clinical pharmacist for 1 month for recheck of blood pressure.  Your blood pressure is not controlled.  The goal is 130/80 or lower.  Please stop metoprolol.  Start amlodipine 5 mg daily and hydrochlorothiazide 12.5 mg daily.  Start Victoza as discussed today.  Continue to monitor blood sugars and bring in your readings on follow-up visit.  Please remember to come to the lab in about 1 week to have blood test done.  Td Vaccine (Tetanus and Diphtheria): What You Need to Know 1. Why get vaccinated? Tetanus  and diphtheria are very serious diseases. They are rare in the [place] today, but people who do become infected often have severe complications. Td vaccine is used to protect adolescents and adults from both of these diseases. Both tetanus and diphtheria are infections caused by bacteria. Diphtheria spreads from person to person through coughing or sneezing. Tetanus-causing bacteria enter the body through cuts, scratches, or wounds. TETANUS (lockjaw) causes painful muscle tightening and stiffness, usually all over the body.  It can lead to tightening of muscles in the head and neck so you can't open your mouth, swallow, or sometimes even breathe. Tetanus kills about 1 out of every 10 people who are infected even after receiving the best medical care.  DIPHTHERIA can cause a thick coating to form in the back of the throat.  It can lead to breathing problems, paralysis, heart failure, and death.  Before vaccines, as many as 200,000 cases of diphtheria and hundreds of cases of tetanus were reported in the [place] each year. Since vaccination began, reports of cases for both diseases have dropped by about 99%. 2. Td vaccine Td vaccine can protect adolescents and adults from tetanus and diphtheria. Td is usually given as a booster dose every 10 years but it can also be given earlier after a severe and dirty wound or burn. Another vaccine, called Tdap, which  protects against pertussis in addition to tetanus and diphtheria, is sometimes recommended instead of Td vaccine. Your doctor or the person giving you the vaccine can give you more information. Td may safely be given at the same time as other vaccines. 3. Some people should not get this vaccine  A person who has ever had a life-threatening allergic reaction after a previous dose of any tetanus or diphtheria containing vaccine, OR has a severe allergy to any part of this vaccine, should not get Td vaccine. Tell the person giving the vaccine about any severe allergies.  Talk to your doctor if you: ? had severe pain or swelling after any vaccine containing diphtheria or tetanus, ? ever had a condition called Guillain Barre Syndrome (GBS), ? aren't feeling well on the day the shot is scheduled. 4. What are the risks from Td vaccine? With any medicine, including vaccines, there is a chance of side effects. These are usually mild and go away on their own. Serious reactions are also possible but are rare. Most people who get Td vaccine do not have any problems with it. Mild problems following Td vaccine: (Did not interfere with activities)  Pain where the shot was given (about 8 people in 10)  Redness or swelling where the shot was given (about 1 person in 4)  Mild fever (rare)  Headache (about 1 person in 4)  Tiredness (about 1 person in 4)  Moderate problems following Td vaccine: (Interfered with activities, but did not require medical attention)  Fever over 102F (rare)  Severe problems following Td  vaccine: (Unable to perform usual activities; required medical attention)  Swelling, severe pain, bleeding and/or redness in the arm where the shot was given (rare).  Problems that could happen after any vaccine:  People sometimes faint after a medical procedure, including vaccination. Sitting or lying down for about 15 minutes can help prevent fainting, and injuries caused by a fall.  Tell your doctor if you feel dizzy, or have vision changes or ringing in the ears.  Some people get severe pain in the shoulder and have difficulty moving the arm where a shot was given. This happens very rarely.  Any medication can cause a severe allergic reaction. Such reactions from a vaccine are very rare, estimated at fewer than 1 in a million doses, and would happen within a few minutes to a few hours after the vaccination. As with any medicine, there is a very remote chance of a vaccine causing a serious injury or death. The safety of vaccines is always being monitored. For more information, visit: http://floyd.org/ 5. What if there is a serious reaction? What should I look for? Look for anything that concerns you, such as signs of a severe allergic reaction, very high fever, or unusual behavior. Signs of a severe allergic reaction can include hives, swelling of the face and throat, difficulty breathing, a fast heartbeat, dizziness, and weakness. These would usually start a few minutes to a few hours after the vaccination. What should I do?  If you think it is a severe allergic reaction or other emergency that can't wait, call 9-1-1 or get the person to the nearest hospital. Otherwise, call your doctor.  Afterward, the reaction should be reported to the Vaccine Adverse Event Reporting System (VAERS). Your doctor might file this report, or you can do it yourself through the VAERS web site at www.vaers.LAgents.no, or by calling 1-(347)162-2378. ? VAERS does not give medical advice. 6. The National Vaccine Injury Compensation Program The [employer] Vaccine Injury Compensation Program (VICP) is a federal program that was created to compensate people who may have been injured by certain vaccines. Persons who believe they may have been injured by a vaccine can learn about the program and about filing a claim by calling 1-(940)820-3862 or visiting the VICP website at  SpiritualWord.at. There is a time limit to file a claim for compensation. 7. How can I learn more?  Ask your doctor. He or she can give you the vaccine package insert or suggest other sources of information.  Call your local or state health department.  Contact the Centers for Disease Control and Prevention (CDC): ? Call 360-616-5889 (1-800-CDC-INFO) ? Visit CDC's website at PicCapture.uy CDC Td Vaccine VIS (12/31/15) This information is not intended to replace advice given to you by your health care provider. Make sure you discuss any questions you have with your health care provider. Document Released: 07/05/2006 Document Revised: 05/28/2016 Document Reviewed: 05/28/2016 Elsevier Interactive Patient Education  2017 [employer].

## 2018-07-14 NOTE — Progress Notes (Signed)
Patient ID: Brittany [name], female    DOB: 10/10/1975  MRN: [id number]  CC: New Patient (Initial Visit)   Subjective: Brittany [name] is a 42 y.o. female who presents for new patient visit. Her concerns today include:  Patient with history of HTN, DM, depression, PTSD and HL  Previous PCP was PA with [facility] in [place].  Decided to change because she felt she was not being listened to.  DM: Currently on glipizide XL 10 mg daily.  Patient reports being tried on several medications in the past but she stopped taking for various reasons.  Reports diarrhea with metformin and for [place].  Reports that her blood sugar stayed elevated when she was on Victoza.  Also stayed high with [place].I inquired with her the medications were titrated but patient states that she was not listened to by her previous PCP. Checking BS -BID; a.m range 90-110, before dinner 160-180 Eating Habits:  "I feel I'm eating better."  Drinks more water.  Weakness is sodas, drinks 2 regular sodas a day Trying to move more; parks further back in parking deck at work Eye:  [facility] 10/2017  HTN: checks BP with wrist device BID.  Gives # of 180/90.   She does not cook with a lot of salt and does not eat too much process foods. Reports compliance with Metoprolol + Fhx of HTN in both parents.   Dep/PTSD:  Was seeing Dr. Lolly Mustache but has not been seen since February of this year.  She has since ran out of her medications.  She does not feel that she needs to be on medicines anymore.  Reports that most of her depression and stress were due to her previous job which she has since left.  She now works for [employer] and is much happier there  HM: had flu 2 wks ago at work.  Due for Tdap.  Had appt with GYN today for pap but had to cancel.  She plans to reschedule Patient Active Problem List   Diagnosis Date Noted  . Severe recurrent major depression without psychotic features (HCC)  01/23/2015    Class: Chronic  . PTSD (post-traumatic stress disorder) 01/04/2015  . Hepatic steatosis 05/16/2014  . Uncontrolled type 2 diabetes mellitus with complication, without long-term current use of insulin (HCC) 05/01/2014  . Nausea, vomiting, and diarrhea 01/05/2014  . HLD (hyperlipidemia) 01/02/2014  . Elevated liver function tests 01/02/2014  . Glucosuria 01/01/2014  . HTN (hypertension) 01/01/2014     Current Outpatient Medications on File Prior to Visit  Medication Sig Dispense Refill  . glipiZIDE (GLUCOTROL XL) 10 MG 24 hr tablet TAKE 1 TABLET (10 MG TOTAL) BY MOUTH DAILY WITH BREAKFAST. 30 tablet 0  . metoprolol succinate (TOPROL-XL) 50 MG 24 hr tablet Take 1 tablet (50 mg total) by mouth daily. 90 tablet 3  . AMBULATORY NON FORMULARY MEDICATION Glucometer, test strips, and lancets.  Ok to fill whatever insurance prefers. Dx: E11.9 (Patient not taking: Reported on 07/14/2018) 100 each 1  . dapagliflozin propanediol (FARXIGA) 10 MG TABS tablet Take 10 mg by mouth daily. (Patient not taking: Reported on 07/14/2018) 30 tablet 2  . Ertugliflozin-Sitagliptin (STEGLUJAN) 15-100 MG TABS Take 1 tablet by mouth daily. 30 tablet 2  . FLUoxetine (PROZAC) 40 MG capsule Take 1 capsule (40 mg total) by mouth daily. 90 capsule 0  . insulin degludec (TRESIBA) 100 UNIT/ML SOPN FlexTouch Pen Inject 0.1 mLs (10 Units total) into the skin daily at  10 pm. (Patient not taking: Reported on 07/14/2018) 2 pen 2  . lamoTRIgine (LAMICTAL) 200 MG tablet Take 1 tablet (200 mg total) by mouth daily. 90 tablet 0  . Lancets (ONETOUCH ULTRASOFT) lancets Use as instructed (Patient not taking: Reported on 07/14/2018) 100 each 12  . liraglutide (VICTOZA) 18 MG/3ML SOPN INJECT 0.2 MLS (1.2 MG TOTAL) INTO THE SKIN EVERY MORNING. 18 pen 0  . traZODone (DESYREL) 100 MG tablet Take 3 tablets (300 mg total) by mouth at bedtime. (Patient not taking: Reported on 07/14/2018) 90 tablet 1   No current  facility-administered medications on file prior to visit.     Allergies  Allergen Reactions  . Janumet Xr [Sitagliptin-Metformin Hcl Er]     diarrhea  . Metformin And Related     diarrhea  . Zoloft [Sertraline Hcl]     Headache, palpitations    Social History   Socioeconomic History  . Marital status: Single    Spouse name: Not on file  . Number of children: Not on file  . Years of education: Not on file  . Highest education level: Not on file  Occupational History  . Not on file  Social Needs  . Financial resource strain: Not on file  . Food insecurity:    Worry: Not on file    Inability: Not on file  . Transportation needs:    Medical: Not on file    Non-medical: Not on file  Tobacco Use  . Smoking status: Never Smoker  . Smokeless tobacco: Never Used  Substance and Sexual Activity  . Alcohol use: No  . Drug use: No  . Sexual activity: Yes    Partners: Male    Birth control/protection: None, Surgical  Lifestyle  . Physical activity:    Days per week: Not on file    Minutes per session: Not on file  . Stress: Not on file  Relationships  . Social connections:    Talks on phone: Not on file    Gets together: Not on file    Attends religious service: Not on file    Active member of club or organization: Not on file    Attends meetings of clubs or organizations: Not on file    Relationship status: Not on file  . Intimate partner violence:    Fear of current or ex partner: Not on file    Emotionally abused: Not on file    Physically abused: Not on file    Forced sexual activity: Not on file  Other Topics Concern  . Not on file  Social History Narrative  . Not on file    Family History  Problem Relation Age of Onset  . Diabetes Mother   . Hyperlipidemia Mother   . Diabetes Father   . Asthma Son   . Diabetes Paternal Grandmother   . Diabetes Paternal Grandfather   . Kidney disease Paternal Grandfather   . Schizophrenia Maternal Grandmother      Past Surgical History:  Procedure Laterality Date  . TUBAL LIGATION      ROS: Review of Systems  Respiratory: Negative for shortness of breath.   Cardiovascular: Negative for chest pain and leg swelling.   PHYSICAL EXAM: BP (!) 166/109 (BP Location: Right Arm, Patient Position: Sitting, Cuff Size: Large)   Pulse 70   Temp 97.8 F (36.6 C) (Oral)   Resp 16   Ht 5\' 3"  (1.6 m)   Wt 232 lb 3.2 oz (105.3 kg)   SpO2 95%  BMI 41.13 kg/m   BP 162/94 Physical Exam  General appearance - alert, well appearing, obese middle-aged African-American female and in no distress Mental status - normal mood, behavior, speech, dress, motor activity, and thought processes Eyes - pupils equal and reactive, extraocular eye movements intact Mouth - mucous membranes moist, pharynx normal without lesions Neck - supple, no significant adenopathy Chest - clear to auscultation, no wheezes, rales or rhonchi, symmetric air entry Heart - normal rate, regular rhythm, normal S1, S2, no murmurs, rubs, clicks or gallops Extremities - peripheral pulses normal, no pedal edema, no clubbing or cyanosis Diabetic Foot Exam - Simple   Simple Foot Form Visual Inspection No deformities, no ulcerations, no other skin breakdown bilaterally:  Yes Sensation Testing Intact to touch and monofilament testing bilaterally:  Yes Pulse Check Posterior Tibialis and Dorsalis pulse intact bilaterally:  Yes Comments      Results for orders placed or performed in visit on 07/14/18  POCT glucose (manual entry)  Result Value Ref Range   POC Glucose 193 (A) 70 - 99 mg/dl  POCT glycosylated hemoglobin (Hb A1C)  Result Value Ref Range   Hemoglobin A1C     HbA1c POC (<> result, manual entry)     HbA1c, POC (prediabetic range)     HbA1c, POC (controlled diabetic range) 10.2 (A) 0.0 - 7.0 %    ASSESSMENT AND PLAN: 1. Uncontrolled diabetes mellitus type 2 without complications (HCC) Stressed the importance of healthy  eating habits and regular exercise.  She has set a goal to cut out drinking sodas. Continue glipizide. She is agreeable to trying Victoza again.  I went over how the medication work.  I have told her that it may bring about a little bit of weight loss as it decreases gastric emptying.  She denies any problems with recurrent nausea or vomiting.  No previous history of pancreatitis. - POCT glucose (manual entry) - POCT glycosylated hemoglobin (Hb A1C) - Comprehensive metabolic panel; Future - CBC; Future - Lipid panel; Future - liraglutide (VICTOZA) 18 MG/3ML SOPN; INJ Subcut.1 mls daily x 1 wk then  0.2 MLS (1.2 MG TOTAL) INTO THE SKIN EVERY MORNING.  Dispense: 18 pen; Refill: 5 - Microalbumin / creatinine urine ratio; Future  2. Essential hypertension Not at goal.  Stop metoprolol.  Start Norvasc and HCTZ instead.  DASH diet discussed and encouraged.  Follow-up with clinical pharmacist in 2 weeks for repeat blood pressure check.  3. Hyperlipidemia, unspecified hyperlipidemia type Last LDL was not at goal.  We will recheck lipid profile  4. Class 3 severe obesity due to excess calories without serious comorbidity with body mass index (BMI) of 40.0 to 44.9 in adult [facility]) See #1 above  5. Need for Tdap vaccination   Patient was given the opportunity to ask questions.  Patient verbalized understanding of the plan and was able to repeat key elements of the plan.   No orders of the defined types were placed in this encounter.    Requested Prescriptions    No prescriptions requested or ordered in this encounter    No follow-ups on file.  Jonah Blue, MD, FACP

## 2018-07-21 ENCOUNTER — Telehealth: Payer: Self-pay | Admitting: *Deleted

## 2018-07-21 ENCOUNTER — Ambulatory Visit: Payer: BLUE CROSS/BLUE SHIELD | Attending: Family Medicine

## 2018-07-21 ENCOUNTER — Ambulatory Visit: Payer: BLUE CROSS/BLUE SHIELD

## 2018-07-21 DIAGNOSIS — E1165 Type 2 diabetes mellitus with hyperglycemia: Secondary | ICD-10-CM | POA: Insufficient documentation

## 2018-07-21 DIAGNOSIS — IMO0001 Reserved for inherently not codable concepts without codable children: Secondary | ICD-10-CM

## 2018-07-21 NOTE — Telephone Encounter (Signed)
Brittany [name] came in today for lab test. While in the office she had concerns about right arm soreness from a Tdap injection she was given on 07/14/2018. She noticed pain began on Tuesday, 07/19/2018.  Erythema and a nodule noticed at the Injection site on right arm. Site is tender to touch and itching. Pt states she has  placed ice to the site.  Pt denies decrease ROM or tingling.  Pt was advised to take Ibuprofen and Zyrtec and place warm compress to site. Instructed to call if no improvement or worsening concerns arise x 1 week.

## 2018-07-22 LAB — COMPREHENSIVE METABOLIC PANEL
ALBUMIN: 4.3 g/dL (ref 3.5–5.5)
ALT: 33 IU/L — AB (ref 0–32)
AST: 23 IU/L (ref 0–40)
Albumin/Globulin Ratio: 1.4 (ref 1.2–2.2)
Alkaline Phosphatase: 79 IU/L (ref 39–117)
BILIRUBIN TOTAL: 0.3 mg/dL (ref 0.0–1.2)
BUN / CREAT RATIO: 18 (ref 9–23)
BUN: 13 mg/dL (ref 6–24)
CALCIUM: 9.5 mg/dL (ref 8.7–10.2)
CHLORIDE: 96 mmol/L (ref 96–106)
CO2: 22 mmol/L (ref 20–29)
Creatinine, Ser: 0.71 mg/dL (ref 0.57–1.00)
GFR, EST AFRICAN AMERICAN: 121 mL/min/{1.73_m2} (ref >59)
GFR, EST NON AFRICAN AMERICAN: 105 mL/min/{1.73_m2} (ref >59)
GLUCOSE: 227 mg/dL — AB (ref 65–99)
Globulin, Total: 3.1 g/dL (ref 1.5–4.5)
Potassium: 4.1 mmol/L (ref 3.5–5.2)
Sodium: 136 mmol/L (ref 134–144)
TOTAL PROTEIN: 7.4 g/dL (ref 6.0–8.5)

## 2018-07-22 LAB — MICROALBUMIN / CREATININE URINE RATIO
CREATININE, UR: 458.8 mg/dL
MICROALB/CREAT RATIO: 11.4 mg/g{creat} (ref 0.0–30.0)
Microalbumin, Urine: 52.3 ug/mL

## 2018-07-22 LAB — LIPID PANEL
CHOLESTEROL TOTAL: 226 mg/dL — AB (ref 100–199)
Chol/HDL Ratio: 3.6 ratio (ref 0.0–4.4)
HDL: 63 mg/dL (ref >39)
LDL CALC: 145 mg/dL — AB (ref 0–99)
Triglycerides: 92 mg/dL (ref 0–149)
VLDL Cholesterol Cal: 18 mg/dL (ref 5–40)

## 2018-07-22 LAB — CBC
HEMOGLOBIN: 13.5 g/dL (ref 11.1–15.9)
Hematocrit: 44.7 % (ref 34.0–46.6)
MCH: 22.2 pg — AB (ref 26.6–33.0)
MCHC: 30.2 g/dL — ABNORMAL LOW (ref 31.5–35.7)
MCV: 74 fL — AB (ref 79–97)
PLATELETS: 354 10*3/uL (ref 150–450)
RBC: 6.07 x10E6/uL — AB (ref 3.77–5.28)
RDW: 13.7 % (ref 12.3–15.4)
WBC: 7.8 10*3/uL (ref 3.4–10.8)

## 2018-07-23 ENCOUNTER — Other Ambulatory Visit: Payer: Self-pay | Admitting: Internal Medicine

## 2018-07-23 MED ORDER — ATORVASTATIN CALCIUM 10 MG PO TABS: 10.0000 mg | ORAL_TABLET | Freq: Every day | ORAL | 3 refills | Status: DC

## 2018-08-10 ENCOUNTER — Other Ambulatory Visit: Payer: Self-pay | Admitting: Internal Medicine

## 2018-08-10 DIAGNOSIS — E1165 Type 2 diabetes mellitus with hyperglycemia: Secondary | ICD-10-CM

## 2018-08-10 DIAGNOSIS — IMO0002 Reserved for concepts with insufficient information to code with codable children: Secondary | ICD-10-CM

## 2018-08-10 DIAGNOSIS — E118 Type 2 diabetes mellitus with unspecified complications: Principal | ICD-10-CM

## 2018-08-10 NOTE — Telephone Encounter (Signed)
1) Medication(s) Requested (by name):glipiZIDE (GLUCOTROL XL) 10 MG 24 hr tablet   2) Pharmacy of Choice:[facility] [address]  3) Special Requests:   Approved medications will be sent to the pharmacy, we will reach out if there is an issue.  Requests made after 3pm may not be addressed until the following business day!  If a patient is unsure of the name of the medication(s) please note and ask patient to call back when they are able to provide all info, do not send to responsible party until all information is available!

## 2018-08-11 MED ORDER — GLIPIZIDE ER 10 MG PO TB24: ORAL_TABLET | ORAL | 2 refills | Status: DC

## 2018-08-15 ENCOUNTER — Encounter: Payer: Self-pay | Admitting: Pharmacist

## 2018-08-15 NOTE — Progress Notes (Deleted)
   S:     PCP: Dr. Laural BenesJohnson  Patient arrives ***.    Presents to the clinic for hypertension management. Patient was referred by Dr. Laural BenesJohnson on 07/14/18.  BP at that visit was 167/114. Metoprolol was stopped. Amlodipine and HCTZ started.  Patient {Actions; denies-reports:120008} adherence with medications.  Current BP Medications include:  Amlodipine 5 mg daily, HCTZ 12.5 mg daily  Antihypertensives tried in the past include: Zestoretic 20-12.5 mg, metoprolol succinate XL 50 mg   Dietary habits include: *** Exercise habits include:*** Family / Social history: ***  ASCVD risk factors include: HTN, HLD, DM   Home BP readings: ***  O:  L arm after 5 minutes rest: ***, HR *** Last 3 Office BP readings: BP Readings from Last 3 Encounters:  07/14/18 (!) 167/114  11/09/17 121/82  09/29/17 (!) 147/87    BMET    Component Value Date/Time   NA 136 07/21/2018 1130   K 4.1 07/21/2018 1130   CL 96 07/21/2018 1130   CO2 22 07/21/2018 1130   GLUCOSE 227 (H) 07/21/2018 1130   GLUCOSE 202 (H) 09/11/2016 0900   BUN 13 07/21/2018 1130   CREATININE 0.71 07/21/2018 1130   CREATININE 0.69 09/11/2016 0900   CALCIUM 9.5 07/21/2018 1130   GFRNONAA 105 07/21/2018 1130   GFRNONAA >89 09/11/2016 0900   GFRAA 121 07/21/2018 1130   GFRAA >89 09/11/2016 0900    Renal function: CrCl cannot be calculated (Patient's most recent lab result is older than the maximum 21 days allowed.).  A/P: Hypertension longstanding/newly diagnosed currently *** on current medications. BP Goal = *** mmHg. Patient {Is/is not:9024} adherent with current medications.  -{Meds adjust:18428} ***.  -F/u labs ordered - *** -Counseled on lifestyle modifications for blood pressure control including reduced dietary sodium, increased exercise, adequate sleep  Results reviewed and written information provided.   Total time in face-to-face counseling *** minutes.   F/U Clinic Visit in ***.  Patient seen with ***

## 2018-09-22 ENCOUNTER — Ambulatory Visit: Payer: Self-pay

## 2018-09-29 ENCOUNTER — Encounter: Payer: Self-pay | Admitting: Internal Medicine

## 2018-09-29 ENCOUNTER — Ambulatory Visit (INDEPENDENT_AMBULATORY_CARE_PROVIDER_SITE_OTHER): Payer: BLUE CROSS/BLUE SHIELD

## 2018-09-29 ENCOUNTER — Ambulatory Visit: Payer: BLUE CROSS/BLUE SHIELD | Attending: Internal Medicine | Admitting: Internal Medicine

## 2018-09-29 VITALS — BP 131/90 | HR 98 | Temp 97.6°F | Resp 16 | Ht 63.0 in | Wt 229.0 lb

## 2018-09-29 DIAGNOSIS — I10 Essential (primary) hypertension: Secondary | ICD-10-CM

## 2018-09-29 DIAGNOSIS — L500 Allergic urticaria: Secondary | ICD-10-CM

## 2018-09-29 DIAGNOSIS — Z1331 Encounter for screening for depression: Secondary | ICD-10-CM

## 2018-09-29 DIAGNOSIS — L509 Urticaria, unspecified: Secondary | ICD-10-CM | POA: Insufficient documentation

## 2018-09-29 DIAGNOSIS — Z6379 Other stressful life events affecting family and household: Secondary | ICD-10-CM | POA: Diagnosis not present

## 2018-09-29 DIAGNOSIS — E7800 Pure hypercholesterolemia, unspecified: Secondary | ICD-10-CM | POA: Diagnosis not present

## 2018-09-29 DIAGNOSIS — E1165 Type 2 diabetes mellitus with hyperglycemia: Secondary | ICD-10-CM

## 2018-09-29 DIAGNOSIS — Z1231 Encounter for screening mammogram for malignant neoplasm of breast: Secondary | ICD-10-CM | POA: Diagnosis not present

## 2018-09-29 DIAGNOSIS — Z6841 Body Mass Index (BMI) 40.0 and over, adult: Secondary | ICD-10-CM

## 2018-09-29 DIAGNOSIS — E119 Type 2 diabetes mellitus without complications: Secondary | ICD-10-CM

## 2018-09-29 LAB — GLUCOSE, POCT (MANUAL RESULT ENTRY): POC GLUCOSE: 286 mg/dL — AB (ref 70–99)

## 2018-09-29 MED ORDER — LORATADINE 10 MG PO TABS: 10.0000 mg | ORAL_TABLET | Freq: Every day | ORAL | 11 refills | Status: DC

## 2018-09-29 MED ORDER — TRAZODONE HCL 50 MG PO TABS: 25.0000 mg | ORAL_TABLET | Freq: Every evening | ORAL | 3 refills | Status: DC | PRN

## 2018-09-29 NOTE — Progress Notes (Signed)
Pt states she her mom has been dx with Alzheimer she has not been able to sleep or eat

## 2018-09-29 NOTE — Progress Notes (Signed)
Patient ID: Brittany [name], female    DOB: 1976/09/06  MRN: [id number]  CC: Hypertension; Diabetes; and Insomnia   Subjective: Brittany [name] is a 43 y.o. female who presents for chronic disease management Her concerns today include:  Patient with history of HTN, DM, depression, PTSD and HL  Patient reports she is under some stress due to a family situation.  Her mother was recently diagnosed with early Alzheimer's.  Mother has been staying with her for the past 3 weeks.  Mother was staying with her brother but due to his schedule he is not able to be her caregiver.  They are currently debating about the best living arrangement for her.   - Not sleeping and eating like she should.  She plans to reach out to the therapist she was seeing in the past for some counseling about once a month.  She does not feel that she is depressed she feels it is more stress.  She was on trazodone in the past to help with sleep and is wanting to know if I would prescribe it for her.  Worried about her mom because she went through the same thing with her GM.    Complains of remittent episodes of hives on the extremities  x 1 mth.  Occurs 1-2 x a wk.  Takes Benadryl.  Thought it was detergent which she has changed she still gets episodes of hives.  She is not changed anything about her eating to think that it is a food allergy.  She does not have any new plants or animals at home.   DM:  Compliant with meds "I just need to get back to eating better."  Does not eat on time then when she does, she grabs something quick.  "I just need to regroup."  Had stopped thinking sodas after our last visit but restarted due to stress Forgot to bring monitor. Range before BF 90-102.  Before dinner 130-150 Exercise:  Not as active and not parking farther away from buildings as she was before.  She plans to do better  HTN: compliant with medications and salt restriction Uses wrist cuff.  Plans to get arm cuff No chest pains  or shortness of breath.  No lower extremity edema.  HL: LDL cholesterol was not at goal on last visit.  I recommended starting Lipitor.  She did get the prescription and is taking it.   Patient Active Problem List   Diagnosis Date Noted  . Class 3 severe obesity due to excess calories without serious comorbidity with body mass index (BMI) of 40.0 to 44.9 in adult (HCC) 09/29/2018  . PTSD (post-traumatic stress disorder) 01/04/2015  . Hepatic steatosis 05/16/2014  . Uncontrolled type 2 diabetes mellitus with complication, without long-term current use of insulin (HCC) 05/01/2014  . Nausea, vomiting, and diarrhea 01/05/2014  . HLD (hyperlipidemia) 01/02/2014  . Elevated liver function tests 01/02/2014  . Glucosuria 01/01/2014  . HTN (hypertension) 01/01/2014     Current Outpatient Medications on File Prior to Visit  Medication Sig Dispense Refill  . amLODipine (NORVASC) 5 MG tablet Take 1 tablet (5 mg total) by mouth daily. 90 tablet 3  . atorvastatin (LIPITOR) 10 MG tablet Take 1 tablet (10 mg total) by mouth daily. 90 tablet 3  . glipiZIDE (GLUCOTROL XL) 10 MG 24 hr tablet TAKE 1 TABLET EVERY DAY WITH BREAKFAST 30 tablet 2  . hydrochlorothiazide (HYDRODIURIL) 12.5 MG tablet Take 1 tablet (12.5 mg total) by mouth daily. 30 tablet  6  . liraglutide (VICTOZA) 18 MG/3ML SOPN INJ Subcut.1 mls daily x 1 wk then  0.2 MLS (1.2 MG TOTAL) INTO THE SKIN EVERY MORNING. 18 pen 5   No current facility-administered medications on file prior to visit.     Allergies  Allergen Reactions  . Metformin And Related Diarrhea  . Zoloft [Sertraline Hcl] Palpitations and Other (See Comments)    Headache    Social History   Socioeconomic History  . Marital status: Single    Spouse name: Not on file  . Number of children: Not on file  . Years of education: Not on file  . Highest education level: Not on file  Occupational History  . Not on file  Social Needs  . Financial resource strain: Not on file    . Food insecurity:    Worry: Not on file    Inability: Not on file  . Transportation needs:    Medical: Not on file    Non-medical: Not on file  Tobacco Use  . Smoking status: Never Smoker  . Smokeless tobacco: Never Used  Substance and Sexual Activity  . Alcohol use: No  . Drug use: No  . Sexual activity: Yes    Partners: Male    Birth control/protection: None, Surgical  Lifestyle  . Physical activity:    Days per week: Not on file    Minutes per session: Not on file  . Stress: Not on file  Relationships  . Social connections:    Talks on phone: Not on file    Gets together: Not on file    Attends religious service: Not on file    Active member of club or organization: Not on file    Attends meetings of clubs or organizations: Not on file    Relationship status: Not on file  . Intimate partner violence:    Fear of current or ex partner: Not on file    Emotionally abused: Not on file    Physically abused: Not on file    Forced sexual activity: Not on file  Other Topics Concern  . Not on file  Social History Narrative  . Not on file    Family History  Problem Relation Age of Onset  . Diabetes Mother   . Hyperlipidemia Mother   . Diabetes Father   . Asthma Son   . Diabetes Paternal Grandmother   . Diabetes Paternal Grandfather   . Kidney disease Paternal Grandfather   . Schizophrenia Maternal Grandmother     Past Surgical History:  Procedure Laterality Date  . TUBAL LIGATION      ROS: Review of Systems Negative except as above. PHYSICAL EXAM: BP 131/90   Pulse 98   Temp 97.6 F (36.4 C) (Oral)   Resp 16   Ht 5\' 3"  (1.6 m)   Wt 229 lb (103.9 kg)   SpO2 96%   BMI 40.57 kg/m   Wt Readings from Last 3 Encounters:  09/29/18 229 lb (103.9 kg)  07/14/18 232 lb 3.2 oz (105.3 kg)  09/29/17 223 lb (101.2 kg)  BP 134/90  Physical Exam  General appearance - alert, well appearing, and in no distress Mental status - normal mood, behavior, speech,  dress, motor activity, and thought processes Neck - supple, no significant adenopathy Chest - clear to auscultation, no wheezes, rales or rhonchi, symmetric air entry Heart - normal rate, regular rhythm, normal S1, S2, no murmurs, rubs, clicks or gallops Extremities - peripheral pulses normal, no pedal edema,  no clubbing or cyanosis   Depression screen [facility] 2/9 09/29/2018 07/14/2018 12/22/2016  Decreased Interest 1 0 0  Down, Depressed, Hopeless 0 0 0  PHQ - 2 Score 1 0 0  Some encounter information is confidential and restricted. Go to Review Flowsheets activity to see all data.  Some recent data might be hidden    ASSESSMENT AND PLAN: 1. Controlled type 2 diabetes mellitus without complication, without long-term current use of insulin (HCC) Patient does not have a log with her today but reported blood sugars are okay.  Advised patient to bring blood sugar log with her on next visit. Discussed the importance of healthy eating habits, regular aerobic exercise (at least 150 minutes a week as tolerated) and medication compliance to achieve or maintain control of diabetes. Patient will do away with drinking sodas.  She will also try to incorporate exercise into her daily routine - POCT glucose (manual entry)  2. Essential hypertension Not at goal.  Increase amlodipine to 10 mg daily.  3. Hyperlipidemia, unspecified hyperlipidemia type Continue Lipitor.  4. Hives Advised patient to keep a food diary.  She will also pay attention to whether she develops symptoms after applying lotion or perfume to the skin.  I recommend using daily Claritin instead of Benadryl as it is not sedating.  If no improvement we can refer her to an allergist. - loratadine (CLARITIN) 10 MG tablet; Take 1 tablet (10 mg total) by mouth daily.  Dispense: 30 tablet; Refill: 11  5. Class 3 severe obesity due to excess calories without serious comorbidity with body mass index (BMI) of 40.0 to 44.9 in adult [facility]) See #1  above.  6. Stressful life event affecting family We discussed ways to help reduce stress and prevent caregiver burnout.  She plans to have a discussion with her mother's doctor about best steps moving forward.  She also plans to call and schedule an appointment with her previous therapist for some counseling.  Prescription given for trazodone to take at bedtime.    Patient was given the opportunity to ask questions.  Patient verbalized understanding of the plan and was able to repeat key elements of the plan.   Orders Placed This Encounter  Procedures  . POCT glucose (manual entry)     Requested Prescriptions   Signed Prescriptions Disp Refills  . traZODone (DESYREL) 50 MG tablet 30 tablet 3    Sig: Take 0.5-1 tablets (25-50 mg total) by mouth at bedtime as needed for sleep.  Marland Kitchen loratadine (CLARITIN) 10 MG tablet 30 tablet 11    Sig: Take 1 tablet (10 mg total) by mouth daily.    Return in about 3 months (around 12/29/2018).  Jonah Blue, MD, FACP

## 2018-09-29 NOTE — Patient Instructions (Signed)
Start trazodone at bedtime to help with sleep.   Please keep a food diary to see whether any foods may be causing the hives.  Take a Claritin daily.  If hives still continue then we will need to refer to an allergist.  Follow a Healthy Eating Plan - You can do it! Limit sugary drinks.  Avoid sodas, sweet tea, sport or energy drinks, or fruit drinks.  Drink water, lo-fat milk, or diet drinks. Limit snack foods.   Cut back on candy, cake, cookies, chips, ice cream.  These are a special treat, only in small amounts. Eat plenty of vegetables.  Especially dark green, red, and orange vegetables. Aim for at least 3 servings a day. More is better! Include fruit in your daily diet.  Whole fruit is much healthier than fruit juice! Limit "white" bread, "white" pasta, "white" rice.   Choose "100% whole grain" products, brown or wild rice. Avoid fatty meats. Try "Meatless Monday" and choose eggs or beans one day a week.  When eating meat, choose lean meats like chicken, [place], and fish.  Grill, broil, or bake meats instead of frying, and eat poultry without the skin. Eat less salt.  Avoid frozen pizzas, frozen dinners and salty foods.  Use seasonings other than salt in cooking.  This can help blood pressure and keep you from swelling Beer, wine and liquor have calories.  If you can safely drink alcohol, limit to 1 drink per day for women, 2 drinks for men

## 2018-10-11 ENCOUNTER — Ambulatory Visit: Payer: BLUE CROSS/BLUE SHIELD | Admitting: Obstetrics & Gynecology

## 2018-10-13 ENCOUNTER — Ambulatory Visit (INDEPENDENT_AMBULATORY_CARE_PROVIDER_SITE_OTHER): Payer: BLUE CROSS/BLUE SHIELD | Admitting: Obstetrics & Gynecology

## 2018-10-13 ENCOUNTER — Encounter: Payer: Self-pay | Admitting: Obstetrics & Gynecology

## 2018-10-13 VITALS — BP 143/96 | HR 74 | Ht 63.0 in | Wt 225.0 lb

## 2018-10-13 DIAGNOSIS — B373 Candidiasis of vulva and vagina: Secondary | ICD-10-CM

## 2018-10-13 DIAGNOSIS — Z1151 Encounter for screening for human papillomavirus (HPV): Secondary | ICD-10-CM | POA: Diagnosis not present

## 2018-10-13 DIAGNOSIS — N898 Other specified noninflammatory disorders of vagina: Secondary | ICD-10-CM | POA: Diagnosis not present

## 2018-10-13 DIAGNOSIS — Z113 Encounter for screening for infections with a predominantly sexual mode of transmission: Secondary | ICD-10-CM

## 2018-10-13 DIAGNOSIS — Z01419 Encounter for gynecological examination (general) (routine) without abnormal findings: Secondary | ICD-10-CM | POA: Diagnosis not present

## 2018-10-13 DIAGNOSIS — Z124 Encounter for screening for malignant neoplasm of cervix: Secondary | ICD-10-CM | POA: Diagnosis not present

## 2018-10-13 DIAGNOSIS — N760 Acute vaginitis: Secondary | ICD-10-CM

## 2018-10-13 MED ORDER — NYSTATIN 100000 UNIT/GM EX CREA: 1.0000 "application " | TOPICAL_CREAM | Freq: Two times a day (BID) | CUTANEOUS | 1 refills | Status: DC

## 2018-10-13 MED ORDER — FLUCONAZOLE 150 MG PO TABS: 150.0000 mg | ORAL_TABLET | Freq: Once | ORAL | 3 refills | Status: DC

## 2018-10-13 NOTE — Progress Notes (Signed)
GYNECOLOGY ANNUAL PREVENTATIVE CARE ENCOUNTER NOTE  Subjective:   Brittany [name] is a 43 y.o. [id number] female here for a routine annual gynecologic exam and to establish care.  Current complaints: feels she has a yeast infection for a week with irritation, wants evaluation.   Denies abnormal vaginal bleeding, pelvic pain, problems with intercourse or other gynecologic concerns.    Gynecologic History Patient's last menstrual period was 10/03/2018 (exact date). Contraception: tubal ligation Last Pap: 5 years ago. Results were: normal, History of one abnormal pap in 2002, normal ones since than.  Last mammogram: 09/29/2018. Results were: normal  Obstetric History OB History  Gravida Para Term Preterm AB Living  2 2 2     2   SAB TAB Ectopic Multiple Live Births          2    # Outcome Date GA Lbr Len/2nd Weight Sex Delivery Anes PTL Lv  2 Term           1 Term             Past Medical History:  Diagnosis Date  . Anxiety   . Depression   . Diabetes mellitus without complication (HCC)   . HLD (hyperlipidemia) 01/02/2014  . Hypertension UNSURE    Past Surgical History:  Procedure Laterality Date  . TUBAL LIGATION      Current Outpatient Medications on File Prior to Visit  Medication Sig Dispense Refill  . amLODipine (NORVASC) 5 MG tablet Take 1 tablet (5 mg total) by mouth daily. 90 tablet 3  . atorvastatin (LIPITOR) 10 MG tablet Take 1 tablet (10 mg total) by mouth daily. 90 tablet 3  . glipiZIDE (GLUCOTROL XL) 10 MG 24 hr tablet TAKE 1 TABLET EVERY DAY WITH BREAKFAST 30 tablet 2  . hydrochlorothiazide (HYDRODIURIL) 12.5 MG tablet Take 1 tablet (12.5 mg total) by mouth daily. 30 tablet 6  . liraglutide (VICTOZA) 18 MG/3ML SOPN INJ Subcut.1 mls daily x 1 wk then  0.2 MLS (1.2 MG TOTAL) INTO THE SKIN EVERY MORNING. 18 pen 5  . loratadine (CLARITIN) 10 MG tablet Take 1 tablet (10 mg total) by mouth daily. 30 tablet 11  . traZODone (DESYREL) 50 MG tablet Take 0.5-1 tablets  (25-50 mg total) by mouth at bedtime as needed for sleep. 30 tablet 3   No current facility-administered medications on file prior to visit.     Allergies  Allergen Reactions  . Metformin And Related Diarrhea  . Zoloft [Sertraline Hcl] Palpitations and Other (See Comments)    Headache    Social History:  reports that she has never smoked. She has never used smokeless tobacco. She reports that she does not drink alcohol or use drugs.  Family History  Problem Relation Age of Onset  . Diabetes Mother   . Hyperlipidemia Mother   . Diabetes Father   . Asthma Son   . Diabetes Paternal Grandmother   . Diabetes Paternal Grandfather   . Kidney disease Paternal Grandfather   . Schizophrenia Maternal Grandmother     The following portions of the patient's history were reviewed and updated as appropriate: allergies, current medications, past family history, past medical history, past social history, past surgical history and problem list.  Review of Systems Pertinent items noted in HPI and remainder of comprehensive ROS otherwise negative.   Objective:  BP (!) 143/96   Pulse 74   Ht 5\' 3"  (1.6 m)   Wt 225 lb (102.1 kg)   LMP 10/03/2018 (Exact Date)  BMI 39.86 kg/m  CONSTITUTIONAL: Well-developed, well-nourished female in no acute distress.  HENT:  Normocephalic, atraumatic, External right and left ear normal. Oropharynx is clear and moist EYES: Conjunctivae and EOM are normal. Pupils are equal, round, and reactive to light. No scleral icterus.  NECK: Normal range of motion, supple, no masses.  Normal thyroid.  SKIN: Skin is warm and dry. No rash noted. Not diaphoretic. No erythema. No pallor. MUSCULOSKELETAL: Normal range of motion. No tenderness.  No cyanosis, clubbing, or edema.  2+ distal pulses. NEUROLOGIC: Alert and oriented to person, place, and time. Normal reflexes, muscle tone coordination. No cranial nerve deficit noted. PSYCHIATRIC: Normal mood and affect. Normal  behavior. Normal judgment and thought content. CARDIOVASCULAR: Normal heart rate noted, regular rhythm RESPIRATORY: Clear to auscultation bilaterally. Effort and breath sounds normal, no problems with respiration noted. BREASTS: Symmetric in size. No masses, skin changes, nipple drainage, or lymphadenopathy. ABDOMEN: Soft, normal bowel sounds, no distention noted.  No tenderness, rebound or guarding.  PELVIC: Normal appearing external genitalia but has diffuse erythematous inflammation noted on her vulva.  Normal appearing vaginal mucosa and cervix. White discharge noted, sample obtained.  Pap smear obtained.  Normal uterine size, no other palpable masses, no uterine or adnexal tenderness.  Imaging Mm 3d Screen Breast Bilateral  Result Date: 09/29/2018 CLINICAL DATA:  Screening. EXAM: DIGITAL SCREENING BILATERAL MAMMOGRAM WITH TOMO AND CAD COMPARISON:  Previous exam(s). ACR Breast Density Category b: There are scattered areas of fibroglandular density. FINDINGS: There are no findings suspicious for malignancy. Images were processed with CAD. IMPRESSION: No mammographic evidence of malignancy. A result letter of this screening mammogram will be mailed directly to the patient. RECOMMENDATION: Screening mammogram in one year. (Code:SM-B-01Y) BI-RADS CATEGORY  1: Negative. Electronically Signed   By: Annia Belt M.D.   On: 09/29/2018 15:11    Assessment and Plan:  1. Acute vulvovaginitis Concerned about candidal vulvovaginitis - Cervicovaginal ancillary only done - nystatin cream (MYCOSTATIN); Apply 1 application topically 2 (two) times daily.  Dispense: 30 g; Refill: 1 - fluconazole (DIFLUCAN) 150 MG tablet; Take 1 tablet (150 mg total) by mouth once for 1 dose. Can take additional dose three days later if symptoms persist  Dispense: 1 tablet; Refill: 3  2. Well woman exam - Cytology - PAP Will follow up results of pap smear and manage accordingly. Mammogram is up-to-date Routine preventative  health maintenance measures emphasized. Please refer to After Visit Summary for other counseling recommendations.    Jaynie Collins, MD, FACOG Obstetrician & Gynecologist, [facility] for [employer], [facility]

## 2018-10-13 NOTE — Progress Notes (Signed)
Had mammogram few weeks ago was normal Has not seen an GYN in probably 5 years has had one abnormal pap in 2002 and had normal on after .

## 2018-10-13 NOTE — Patient Instructions (Signed)
Preventive Care 40-64 Years, Female Preventive care refers to lifestyle choices and visits with your health care provider that can promote health and wellness. What does preventive care include?   A yearly physical exam. This is also called an annual well check.  Dental exams once or twice a year.  Routine eye exams. Ask your health care provider how often you should have your eyes checked.  Personal lifestyle choices, including: ? Daily care of your teeth and gums. ? Regular physical activity. ? Eating a healthy diet. ? Avoiding tobacco and drug use. ? Limiting alcohol use. ? Practicing safe sex. ? Taking low-dose aspirin daily starting at age 50. ? Taking vitamin and mineral supplements as recommended by your health care provider. What happens during an annual well check? The services and screenings done by your health care provider during your annual well check will depend on your age, overall health, lifestyle risk factors, and family history of disease. Counseling Your health care provider may ask you questions about your:  Alcohol use.  Tobacco use.  Drug use.  Emotional well-being.  Home and relationship well-being.  Sexual activity.  Eating habits.  Work and work environment.  Method of birth control.  Menstrual cycle.  Pregnancy history. Screening You may have the following tests or measurements:  Height, weight, and BMI.  Blood pressure.  Lipid and cholesterol levels. These may be checked every 5 years, or more frequently if you are over 50 years old.  Skin check.  Lung cancer screening. You may have this screening every year starting at age 55 if you have a 30-pack-year history of smoking and currently smoke or have quit within the past 15 years.  Colorectal cancer screening. All adults should have this screening starting at age 50 and continuing until age 75. Your health care provider may recommend screening at age 45. You will have tests every  1-10 years, depending on your results and the type of screening test. People at increased risk should start screening at an earlier age. Screening tests may include: ? Guaiac-based fecal occult blood testing. ? Fecal immunochemical test (FIT). ? Stool DNA test. ? Virtual colonoscopy. ? Sigmoidoscopy. During this test, a flexible tube with a tiny camera (sigmoidoscope) is used to examine your rectum and lower colon. The sigmoidoscope is inserted through your anus into your rectum and lower colon. ? Colonoscopy. During this test, a long, thin, flexible tube with a tiny camera (colonoscope) is used to examine your entire colon and rectum.  Hepatitis C blood test.  Hepatitis B blood test.  Sexually transmitted disease (STD) testing.  Diabetes screening. This is done by checking your blood sugar (glucose) after you have not eaten for a while (fasting). You may have this done every 1-3 years.  Mammogram. This may be done every 1-2 years. Talk to your health care provider about when you should start having regular mammograms. This may depend on whether you have a family history of breast cancer.  BRCA-related cancer screening. This may be done if you have a family history of breast, ovarian, tubal, or peritoneal cancers.  Pelvic exam and Pap test. This may be done every 3 years starting at age 21. Starting at age 30, this may be done every 5 years if you have a Pap test in combination with an HPV test.  Bone density scan. This is done to screen for osteoporosis. You may have this scan if you are at high risk for osteoporosis. Discuss your test results, treatment options,   and if necessary, the need for more tests with your health care provider. Vaccines Your health care provider may recommend certain vaccines, such as:  Influenza vaccine. This is recommended every year.  Tetanus, diphtheria, and acellular pertussis (Tdap, Td) vaccine. You may need a Td booster every 10 years.  Varicella  vaccine. You may need this if you have not been vaccinated.  Zoster vaccine. You may need this after age 38.  Measles, mumps, and rubella (MMR) vaccine. You may need at least one dose of MMR if you were born in 1957 or later. You may also need a second dose.  Pneumococcal 13-valent conjugate (PCV13) vaccine. You may need this if you have certain conditions and were not previously vaccinated.  Pneumococcal polysaccharide (PPSV23) vaccine. You may need one or two doses if you smoke cigarettes or if you have certain conditions.  Meningococcal vaccine. You may need this if you have certain conditions.  Hepatitis A vaccine. You may need this if you have certain conditions or if you travel or work in places where you may be exposed to hepatitis A.  Hepatitis B vaccine. You may need this if you have certain conditions or if you travel or work in places where you may be exposed to hepatitis B.  Haemophilus influenzae type b (Hib) vaccine. You may need this if you have certain conditions. Talk to your health care provider about which screenings and vaccines you need and how often you need them. This information is not intended to replace advice given to you by your health care provider. Make sure you discuss any questions you have with your health care provider. Document Released: 10/04/2015 Document Revised: 10/28/2017 Document Reviewed: 07/09/2015 Elsevier Interactive Patient Education  2019 [employer].

## 2018-10-14 LAB — CERVICOVAGINAL ANCILLARY ONLY
Bacterial vaginitis: NEGATIVE
Candida vaginitis: POSITIVE — AB
Chlamydia: NEGATIVE
Neisseria Gonorrhea: NEGATIVE
Trichomonas: NEGATIVE

## 2018-10-17 LAB — CYTOLOGY - PAP
Diagnosis: NEGATIVE
HPV: NOT DETECTED

## 2018-10-29 ENCOUNTER — Other Ambulatory Visit: Payer: Self-pay | Admitting: Internal Medicine

## 2018-11-10 ENCOUNTER — Other Ambulatory Visit: Payer: Self-pay | Admitting: Internal Medicine

## 2018-11-10 DIAGNOSIS — IMO0002 Reserved for concepts with insufficient information to code with codable children: Secondary | ICD-10-CM

## 2018-11-10 DIAGNOSIS — E1165 Type 2 diabetes mellitus with hyperglycemia: Secondary | ICD-10-CM

## 2018-11-10 DIAGNOSIS — E118 Type 2 diabetes mellitus with unspecified complications: Principal | ICD-10-CM

## 2018-11-22 ENCOUNTER — Encounter: Payer: Self-pay | Admitting: Internal Medicine

## 2018-11-23 ENCOUNTER — Ambulatory Visit: Payer: BLUE CROSS/BLUE SHIELD | Admitting: Obstetrics & Gynecology

## 2018-11-23 DIAGNOSIS — Z09 Encounter for follow-up examination after completed treatment for conditions other than malignant neoplasm: Secondary | ICD-10-CM

## 2018-11-23 MED ORDER — LISINOPRIL 5 MG PO TABS: 5.0000 mg | ORAL_TABLET | Freq: Every day | ORAL | 3 refills | Status: DC

## 2018-11-23 NOTE — Progress Notes (Deleted)
   Patient did not show up today for her scheduled appointment.   Ryker Sudbury, MD, FACOG Obstetrician & Gynecologist, [facility], [facility]  

## 2018-12-15 ENCOUNTER — Telehealth: Payer: Self-pay | Admitting: Internal Medicine

## 2018-12-15 NOTE — Telephone Encounter (Signed)
Patient called because she would like to get tested for the COVID. Patient states she has had a fever these past two days.   Patients states her fever yesterday was 102  Patient states her fever today is 104 Feels week Patient states when she takes a deep breath she starts coughing uncontrollably  Spoke with triage nurse and advised to go to the ED

## 2018-12-21 ENCOUNTER — Other Ambulatory Visit: Payer: Self-pay | Admitting: Obstetrics & Gynecology

## 2018-12-21 DIAGNOSIS — N760 Acute vaginitis: Secondary | ICD-10-CM

## 2018-12-27 ENCOUNTER — Telehealth: Payer: BLUE CROSS/BLUE SHIELD | Admitting: Physician Assistant

## 2018-12-27 DIAGNOSIS — E1365 Other specified diabetes mellitus with hyperglycemia: Secondary | ICD-10-CM

## 2018-12-27 DIAGNOSIS — I10 Essential (primary) hypertension: Secondary | ICD-10-CM

## 2018-12-27 DIAGNOSIS — Z20828 Contact with and (suspected) exposure to other viral communicable diseases: Secondary | ICD-10-CM

## 2018-12-27 NOTE — Progress Notes (Signed)
E-Visit for [employer] Virus Screening  Based on what you have shared with me, you need to seek an evaluation for a severe illness that is causing your symptoms which may be coronavirus or some other illness. I recommend that you be seen and evaluated "face to face". Our Emergency Departments are best equipped to handle patients with severe symptoms.   I recommend the following:  If you are having a true medical emergency please call 911. If you are considered high risk for Corona virus because of a known exposure, fever, shortness of breath and cough, OR if you have severe symptoms of any kind, seek medical care at an emergency room.  Please call ahead and tell them that you were seen by telemedicine and they have recommended that you have a face to face evaluation. [facility] Emergency Department [address] [place], [place], [place] [address] [phone]  [facility] Emergency Department [address] [place], [place] [address] [phone]  [facility] Emergency Department [address] [place], [place], [place] [address] [phone]  [facility] Emergency Department [address] [place], [place], [place] [address] [phone]  [facility] Emergency Department [address] [place], [place], [place] [address] [phone]  NOTE: If you entered your credit card information for this eVisit, you will not be charged. You may see a "hold" on your card for the $35 but that hold will drop off and you will not have a charge processed.   Your e-visit answers were reviewed by a board certified advanced clinical practitioner to complete your personal care plan.  Thank you for using e-Visits.  ===View-only below this line===   ----- Message -----    From: Roanna Banning    Sent: 12/27/2018 12:31 PM EDT      To: E-Visit Mailing List Subject: E-Visit Submission: CoronaVirus (COVID-19)  Screening  E-Visit Submission: CoronaVirus (COVID-19) Screening --------------------------------  Question: Do you have any of the following?  Answer:   Cough            Fever  Question: Do you have any of the following additional symptoms?  Answer:   Sore throat            Body aches            Headache  Question: Have you had a fever? Answer:   Yes  Question: If you are running a fever, please type in your temperature reading Answer:   100.2  Question: How long have you had the fever? Answer:   For more than a week  Question: Have others in your home or workplace had similar symptoms? Answer:   Yes  Question: When did your symptoms start? Answer:   12/14/18  Question: Have you recently visited any of the following countries? Answer:   None of these  Question: If you have traveled anywhere in the last  2 months please document where you have visited: Answer:   Visited my mother  Question: Have you recently been around others from these countries or visited these countries who have had coughing or fever? Answer:   No  Question: Have you recently been around anyone who has been diagnosed with Corona virus? Answer:   Yes  Question: Have you been taking any medications? Answer:   Yes  Question: If taking medications for these symptoms, please list the names and whether they are helping or not Answer:   Tylenol for fever  Question: Are you treated for any of the following conditions: Asthma, COPD, Diabetes, Renal Failure (on Dialysis), AIDS, any Neuromuscular disease that effects the clearing of secretions, Heart Failure, or Heart Disease? Answer:   Yes  Question: Please enter a phone number where you can be reached if we have additional questions about your symptoms Answer:   [phone]  Question: Please list your medication allergies that you may have ? (If 'none' , please list as 'none') Answer:   None  Question: Please list any additional comments  Answer:      Question: Are you pregnant? Answer:   I am confident that I am not pregnant  Question: Are you breastfeeding? Answer:   No  A total of 5-10 minutes was spent evaluating this patients questionnaire and formulating a plan of care.   Lab Results  Component Value Date   HGBA1C 10.2 (A) 07/14/2018   Lab Results  Component Value Date   CHOL 226 (H) 07/21/2018   HDL 63 07/21/2018   LDLCALC 145 (H) 07/21/2018   TRIG 92 07/21/2018   CHOLHDL 3.6 07/21/2018   BP Readings from Last 3 Encounters:  10/13/18 (!) 143/96  09/29/18 131/90  07/14/18 (!) 167/114

## 2018-12-28 ENCOUNTER — Encounter: Payer: Self-pay | Admitting: Internal Medicine

## 2018-12-28 NOTE — Telephone Encounter (Signed)
Please advise on trazadone refill request. FMLA paperwork was taken out of PCP box and shredded per patient request.

## 2019-01-02 ENCOUNTER — Ambulatory Visit: Payer: Self-pay | Admitting: Internal Medicine

## 2019-01-02 MED ORDER — TRAZODONE HCL 50 MG PO TABS: ORAL_TABLET | ORAL | 1 refills | Status: DC

## 2019-01-09 ENCOUNTER — Other Ambulatory Visit: Payer: Self-pay | Admitting: Internal Medicine

## 2019-01-17 ENCOUNTER — Ambulatory Visit: Payer: Self-pay | Admitting: Internal Medicine

## 2019-02-07 ENCOUNTER — Other Ambulatory Visit: Payer: Self-pay | Admitting: Internal Medicine

## 2019-02-07 DIAGNOSIS — IMO0002 Reserved for concepts with insufficient information to code with codable children: Secondary | ICD-10-CM

## 2019-02-07 DIAGNOSIS — E1165 Type 2 diabetes mellitus with hyperglycemia: Secondary | ICD-10-CM

## 2019-05-19 ENCOUNTER — Other Ambulatory Visit: Payer: Self-pay | Admitting: Obstetrics & Gynecology

## 2019-05-19 DIAGNOSIS — N760 Acute vaginitis: Secondary | ICD-10-CM

## 2019-08-07 ENCOUNTER — Other Ambulatory Visit: Payer: Self-pay | Admitting: Internal Medicine

## 2019-08-07 DIAGNOSIS — E1165 Type 2 diabetes mellitus with hyperglycemia: Secondary | ICD-10-CM

## 2019-08-07 DIAGNOSIS — IMO0002 Reserved for concepts with insufficient information to code with codable children: Secondary | ICD-10-CM

## 2019-09-12 ENCOUNTER — Other Ambulatory Visit: Payer: Self-pay | Admitting: Internal Medicine

## 2019-09-12 DIAGNOSIS — IMO0002 Reserved for concepts with insufficient information to code with codable children: Secondary | ICD-10-CM

## 2019-09-12 DIAGNOSIS — E1165 Type 2 diabetes mellitus with hyperglycemia: Secondary | ICD-10-CM

## 2019-09-18 ENCOUNTER — Encounter: Payer: Self-pay | Admitting: Radiology

## 2020-05-07 ENCOUNTER — Ambulatory Visit: Payer: Self-pay | Attending: Internal Medicine

## 2020-05-07 DIAGNOSIS — Z23 Encounter for immunization: Secondary | ICD-10-CM

## 2020-05-07 NOTE — Progress Notes (Signed)
   Covid-19 Vaccination Clinic  Name:  Brittin Belnap    MRN: [id number] DOB: Jan 26, 1976  05/07/2020  Ms. Eutsler was observed post Covid-19 immunization for 15 minutes without incident. She was provided with Vaccine Information Sheet and instruction to access the V-Safe system.   Ms. Shampine was instructed to call 911 with any severe reactions post vaccine: Marland Kitchen Difficulty breathing  . Swelling of face and throat  . A fast heartbeat  . A bad rash all over body  . Dizziness and weakness   Immunizations Administered    Name Date Dose VIS Date Route   Moderna COVID-19 Vaccine 05/07/2020 10:10 AM 0.5 mL 08/2019 Intramuscular   Manufacturer: Moderna   Lot: [id number]   NDC: 56387-564-33

## 2020-05-13 ENCOUNTER — Other Ambulatory Visit: Payer: Self-pay | Admitting: Internal Medicine

## 2020-05-13 DIAGNOSIS — Z1231 Encounter for screening mammogram for malignant neoplasm of breast: Secondary | ICD-10-CM

## 2020-05-22 ENCOUNTER — Ambulatory Visit (INDEPENDENT_AMBULATORY_CARE_PROVIDER_SITE_OTHER): Payer: BC Managed Care – PPO

## 2020-05-22 ENCOUNTER — Other Ambulatory Visit: Payer: Self-pay

## 2020-05-22 DIAGNOSIS — Z1231 Encounter for screening mammogram for malignant neoplasm of breast: Secondary | ICD-10-CM | POA: Diagnosis not present

## 2020-06-04 ENCOUNTER — Ambulatory Visit: Payer: Self-pay

## 2020-06-04 ENCOUNTER — Ambulatory Visit (INDEPENDENT_AMBULATORY_CARE_PROVIDER_SITE_OTHER): Payer: BC Managed Care – PPO | Admitting: Obstetrics & Gynecology

## 2020-06-04 ENCOUNTER — Other Ambulatory Visit (HOSPITAL_COMMUNITY)
Admission: RE | Admit: 2020-06-04 | Discharge: 2020-06-04 | Disposition: A | Discharge status: Home / Self Care | Payer: BC Managed Care – PPO | Source: Ambulatory Visit | Attending: Obstetrics & Gynecology | Admitting: Obstetrics & Gynecology

## 2020-06-04 ENCOUNTER — Encounter: Payer: Self-pay | Admitting: Obstetrics & Gynecology

## 2020-06-04 ENCOUNTER — Other Ambulatory Visit: Payer: Self-pay

## 2020-06-04 VITALS — BP 152/97 | HR 87 | Ht 63.0 in | Wt 212.8 lb

## 2020-06-04 DIAGNOSIS — B379 Candidiasis, unspecified: Secondary | ICD-10-CM

## 2020-06-04 DIAGNOSIS — B3731 Acute candidiasis of vulva and vagina: Secondary | ICD-10-CM

## 2020-06-04 DIAGNOSIS — B372 Candidiasis of skin and nail: Secondary | ICD-10-CM | POA: Diagnosis not present

## 2020-06-04 DIAGNOSIS — Z01419 Encounter for gynecological examination (general) (routine) without abnormal findings: Secondary | ICD-10-CM | POA: Insufficient documentation

## 2020-06-04 DIAGNOSIS — N760 Acute vaginitis: Secondary | ICD-10-CM

## 2020-06-04 DIAGNOSIS — B9689 Other specified bacterial agents as the cause of diseases classified elsewhere: Secondary | ICD-10-CM

## 2020-06-04 DIAGNOSIS — Z23 Encounter for immunization: Secondary | ICD-10-CM

## 2020-06-04 DIAGNOSIS — Z113 Encounter for screening for infections with a predominantly sexual mode of transmission: Secondary | ICD-10-CM | POA: Diagnosis not present

## 2020-06-04 DIAGNOSIS — B373 Candidiasis of vulva and vagina: Secondary | ICD-10-CM

## 2020-06-04 MED ORDER — FLUCONAZOLE 150 MG PO TABS: 150.0000 mg | ORAL_TABLET | ORAL | 3 refills | Status: DC | PRN

## 2020-06-04 MED ORDER — NYSTATIN 100000 UNIT/GM EX CREA: TOPICAL_CREAM | Freq: Two times a day (BID) | CUTANEOUS | 1 refills | Status: DC

## 2020-06-04 MED ORDER — NYSTATIN-TRIAMCINOLONE 100000-0.1 UNIT/GM-% EX CREA: 1.0000 "application " | TOPICAL_CREAM | Freq: Two times a day (BID) | CUTANEOUS | 2 refills | Status: DC

## 2020-06-04 NOTE — Patient Instructions (Addendum)
Preventive Care 40-44 Years Old, Female Preventive care refers to visits with your health care provider and lifestyle choices that can promote health and wellness. This includes:  A yearly physical exam. This may also be called an annual well check.  Regular dental visits and eye exams.  Immunizations.  Screening for certain conditions.  Healthy lifestyle choices, such as eating a healthy diet, getting regular exercise, not using drugs or products that contain nicotine and tobacco, and limiting alcohol use. What can I expect for my preventive care visit? Physical exam Your health care provider will check your:  Height and weight. This may be used to calculate body mass index (BMI), which tells if you are at a healthy weight.  Heart rate and blood pressure.  Skin for abnormal spots. Counseling Your health care provider may ask you questions about your:  Alcohol, tobacco, and drug use.  Emotional well-being.  Home and relationship well-being.  Sexual activity.  Eating habits.  Work and work environment.  Method of birth control.  Menstrual cycle.  Pregnancy history. What immunizations do I need?  Influenza (flu) vaccine  This is recommended every year. Tetanus, diphtheria, and pertussis (Tdap) vaccine  You may need a Td booster every 10 years. Varicella (chickenpox) vaccine  You may need this if you have not been vaccinated. Zoster (shingles) vaccine  You may need this after age 60. Measles, mumps, and rubella (MMR) vaccine  You may need at least one dose of MMR if you were born in 1957 or later. You may also need a second dose. Pneumococcal conjugate (PCV13) vaccine  You may need this if you have certain conditions and were not previously vaccinated. Pneumococcal polysaccharide (PPSV23) vaccine  You may need one or two doses if you smoke cigarettes or if you have certain conditions. Meningococcal conjugate (MenACWY) vaccine  You may need this if you  have certain conditions. Hepatitis A vaccine  You may need this if you have certain conditions or if you travel or work in places where you may be exposed to hepatitis A. Hepatitis B vaccine  You may need this if you have certain conditions or if you travel or work in places where you may be exposed to hepatitis B. Haemophilus influenzae type b (Hib) vaccine  You may need this if you have certain conditions. Human papillomavirus (HPV) vaccine  If recommended by your health care provider, you may need three doses over 6 months. You may receive vaccines as individual doses or as more than one vaccine together in one shot (combination vaccines). Talk with your health care provider about the risks and benefits of combination vaccines. What tests do I need? Blood tests  Lipid and cholesterol levels. These may be checked every 5 years, or more frequently if you are over 50 years old.  Hepatitis C test.  Hepatitis B test. Screening  Lung cancer screening. You may have this screening every year starting at age 55 if you have a 30-pack-year history of smoking and currently smoke or have quit within the past 15 years.  Colorectal cancer screening. All adults should have this screening starting at age 50 and continuing until age 75. Your health care provider may recommend screening at age 45 if you are at increased risk. You will have tests every 1-10 years, depending on your results and the type of screening test.  Diabetes screening. This is done by checking your blood sugar (glucose) after you have not eaten for a while (fasting). You may have this   done every 1-3 years.  Mammogram. This may be done every 1-2 years. Talk with your health care provider about when you should start having regular mammograms. This may depend on whether you have a family history of breast cancer.  BRCA-related cancer screening. This may be done if you have a family history of breast, ovarian, tubal, or peritoneal  cancers.  Pelvic exam and Pap test. This may be done every 3 years starting at age 21. Starting at age 35, this may be done every 5 years if you have a Pap test in combination with an HPV test. Other tests  Sexually transmitted disease (STD) testing.  Bone density scan. This is done to screen for osteoporosis. You may have this scan if you are at high risk for osteoporosis. Follow these instructions at home: Eating and drinking  Eat a diet that includes fresh fruits and vegetables, whole grains, lean protein, and low-fat dairy.  Take vitamin and mineral supplements as recommended by your health care provider.  Do not drink alcohol if: ? Your health care provider tells you not to drink. ? You are pregnant, may be pregnant, or are planning to become pregnant.  If you drink alcohol: ? Limit how much you have to 0-1 drink a day. ? Be aware of how much alcohol is in your drink. In the [place] one drink equals one 12 oz bottle of beer (355 mL), one 5 oz glass of wine (148 mL), or one 1 oz glass of hard liquor (44 mL). Lifestyle  Take daily care of your teeth and gums.  Stay active. Exercise for at least 30 minutes on 5 or more days each week.  Do not use any products that contain nicotine or tobacco, such as cigarettes, e-cigarettes, and chewing tobacco. If you need help quitting, ask your health care provider.  If you are sexually active, practice safe sex. Use a condom or other form of birth control (contraception) in order to prevent pregnancy and STIs (sexually transmitted infections).  If told by your health care provider, take low-dose aspirin daily starting at age 17. What's next?  Visit your health care provider once a year for a well check visit.  Ask your health care provider how often you should have your eyes and teeth checked.  Stay up to date on all vaccines. This information is not intended to replace advice given to you by your health care provider. Make sure you  discuss any questions you have with your health care provider. Document Revised: 05/19/2018 Document Reviewed: 05/19/2018 Elsevier Patient Education  [place].  Skin Yeast Infection  A skin yeast infection is a condition in which there is an overgrowth of yeast (candida) that normally lives on the skin. This condition usually occurs in areas of the skin that are constantly warm and moist, such as the armpits or the groin. What are the causes? This condition is caused by a change in the normal balance of the yeast and bacteria that live on the skin. What increases the risk? You are more likely to develop this condition if you:  Are obese.  Are pregnant.  Take birth control pills.  Have diabetes.  Take antibiotic medicines.  Take steroid medicines.  Are malnourished.  Have a weak body defense system (immune system).  Are 70 years of age or older.  Wear tight clothing. What are the signs or symptoms? The most common symptom of this condition is itchiness in the affected area. Other symptoms include:  Red,  swollen area of the skin.  Bumps on the skin. How is this diagnosed?  This condition is diagnosed with a medical history and physical exam.  Your health care provider may check for yeast by taking light scrapings of the skin to be viewed under a microscope. How is this treated? This condition is treated with medicine. Medicines may be prescribed or be available over the counter. The medicines may be:  Taken by mouth (orally).  Applied as a cream or powder to your skin. Follow these instructions at home:   Take or apply over-the-counter and prescription medicines only as told by your health care provider.  Maintain a healthy weight. If you need help losing weight, talk with your health care provider.  Keep your skin clean and dry.  If you have diabetes, keep your blood sugar under control.  Keep all follow-up visits as told by your health care  provider. This is important. Contact a health care provider if:  Your symptoms go away and then return.  Your symptoms do not get better with treatment.  Your symptoms get worse.  Your rash spreads.  You have a fever or chills.  You have new symptoms.  You have new warmth or redness of your skin. Summary  A skin yeast infection is a condition in which there is an overgrowth of yeast (candida) that normally lives on the skin. This condition is caused by a change in the normal balance of the yeast and bacteria that live on the skin.  Take or apply over-the-counter and prescription medicines only as told by your health care provider.  Keep your skin clean and dry.  Contact a health care provider if your symptoms do not get better with treatment. This information is not intended to replace advice given to you by your health care provider. Make sure you discuss any questions you have with your health care provider. Document Revised: 01/25/2018 Document Reviewed: 01/25/2018 Elsevier Patient Education  2020 [employer] Inc.  Preventive Care 29-65 Years Old, Female Preventive care refers to visits with your health care provider and lifestyle choices that can promote health and wellness. This includes:  A yearly physical exam. This may also be called an annual well check.  Regular dental visits and eye exams.  Immunizations.  Screening for certain conditions.  Healthy lifestyle choices, such as eating a healthy diet, getting regular exercise, not using drugs or products that contain nicotine and tobacco, and limiting alcohol use. What can I expect for my preventive care visit? Physical exam Your health care provider will check your:  Height and weight. This may be used to calculate body mass index (BMI), which tells if you are at a healthy weight.  Heart rate and blood pressure.  Skin for abnormal spots. Counseling Your health care provider may ask you questions about  your:  Alcohol, tobacco, and drug use.  Emotional well-being.  Home and relationship well-being.  Sexual activity.  Eating habits.  Work and work Statistician.  Method of birth control.  Menstrual cycle.  Pregnancy history. What immunizations do I need?  Influenza (flu) vaccine  This is recommended every year. Tetanus, diphtheria, and pertussis (Tdap) vaccine  You may need a Td booster every 10 years. Varicella (chickenpox) vaccine  You may need this if you have not been vaccinated. Zoster (shingles) vaccine  You may need this after age 51. Measles, mumps, and rubella (MMR) vaccine  You may need at least one dose of MMR if you were born in 1957  or later. You may also need a second dose. Pneumococcal conjugate (PCV13) vaccine  You may need this if you have certain conditions and were not previously vaccinated. Pneumococcal polysaccharide (PPSV23) vaccine  You may need one or two doses if you smoke cigarettes or if you have certain conditions. Meningococcal conjugate (MenACWY) vaccine  You may need this if you have certain conditions. Hepatitis A vaccine  You may need this if you have certain conditions or if you travel or work in places where you may be exposed to hepatitis A. Hepatitis B vaccine  You may need this if you have certain conditions or if you travel or work in places where you may be exposed to hepatitis B. Haemophilus influenzae type b (Hib) vaccine  You may need this if you have certain conditions. Human papillomavirus (HPV) vaccine  If recommended by your health care provider, you may need three doses over 6 months. You may receive vaccines as individual doses or as more than one vaccine together in one shot (combination vaccines). Talk with your health care provider about the risks and benefits of combination vaccines. What tests do I need? Blood tests  Lipid and cholesterol levels. These may be checked every 5 years, or more frequently if  you are over 24 years old.  Hepatitis C test.  Hepatitis B test. Screening  Lung cancer screening. You may have this screening every year starting at age 74 if you have a 30-pack-year history of smoking and currently smoke or have quit within the past 15 years.  Colorectal cancer screening. All adults should have this screening starting at age 25 and continuing until age 110. Your health care provider may recommend screening at age 52 if you are at increased risk. You will have tests every 1-10 years, depending on your results and the type of screening test.  Diabetes screening. This is done by checking your blood sugar (glucose) after you have not eaten for a while (fasting). You may have this done every 1-3 years.  Mammogram. This may be done every 1-2 years. Talk with your health care provider about when you should start having regular mammograms. This may depend on whether you have a family history of breast cancer.  BRCA-related cancer screening. This may be done if you have a family history of breast, ovarian, tubal, or peritoneal cancers.  Pelvic exam and Pap test. This may be done every 3 years starting at age 40. Starting at age 66, this may be done every 5 years if you have a Pap test in combination with an HPV test. Other tests  Sexually transmitted disease (STD) testing.  Bone density scan. This is done to screen for osteoporosis. You may have this scan if you are at high risk for osteoporosis. Follow these instructions at home: Eating and drinking  Eat a diet that includes fresh fruits and vegetables, whole grains, lean protein, and low-fat dairy.  Take vitamin and mineral supplements as recommended by your health care provider.  Do not drink alcohol if: ? Your health care provider tells you not to drink. ? You are pregnant, may be pregnant, or are planning to become pregnant.  If you drink alcohol: ? Limit how much you have to 0-1 drink a day. ? Be aware of how much  alcohol is in your drink. In the [place] one drink equals one 12 oz bottle of beer (355 mL), one 5 oz glass of wine (148 mL), or one 1 oz glass of hard liquor (44  mL). Lifestyle  Take daily care of your teeth and gums.  Stay active. Exercise for at least 30 minutes on 5 or more days each week.  Do not use any products that contain nicotine or tobacco, such as cigarettes, e-cigarettes, and chewing tobacco. If you need help quitting, ask your health care provider.  If you are sexually active, practice safe sex. Use a condom or other form of birth control (contraception) in order to prevent pregnancy and STIs (sexually transmitted infections).  If told by your health care provider, take low-dose aspirin daily starting at age 60. What's next?  Visit your health care provider once a year for a well check visit.  Ask your health care provider how often you should have your eyes and teeth checked.  Stay up to date on all vaccines. This information is not intended to replace advice given to you by your health care provider. Make sure you discuss any questions you have with your health care provider. Document Revised: 05/19/2018 Document Reviewed: 05/19/2018 Elsevier Patient Education  2020 [employer].

## 2020-06-04 NOTE — Progress Notes (Signed)
GYNECOLOGY ANNUAL PREVENTATIVE CARE ENCOUNTER NOTE  History:     Brittany [name] is a 44 y.o. [id number] female here for a routine annual gynecologic exam.  Current complaints: recurrent yeast infection of her vulvovaginal area and inner thighs causing severe inflammation and discomfort.  Also reports having abnormal vaginal discharge.  She gets these episodes infrequently  Denies abnormal vaginal bleeding, discharge, pelvic pain, problems with intercourse or other gynecologic concerns.    Gynecologic History Patient's last menstrual period was 05/24/2020 (exact date). Contraception: tubal ligation Last Pap: 10/13/2018. Results were: normal with negative HPV Last mammogram: 05/23/2020. Results were: normal  Obstetric History OB History  Gravida Para Term Preterm AB Living  2 2 2     2   SAB TAB Ectopic Multiple Live Births          2    # Outcome Date GA Lbr Len/2nd Weight Sex Delivery Anes PTL Lv  2 Term           1 Term             Past Medical History:  Diagnosis Date  . Anxiety   . Depression   . Diabetes mellitus without complication (HCC)   . HLD (hyperlipidemia) 01/02/2014  . Hypertension UNSURE    Past Surgical History:  Procedure Laterality Date  . TUBAL LIGATION      Current Outpatient Medications on File Prior to Visit  Medication Sig Dispense Refill  . amLODipine (NORVASC) 5 MG tablet Take 1 tablet (5 mg total) by mouth daily. 90 tablet 3  . loratadine (CLARITIN) 10 MG tablet Take 1 tablet (10 mg total) by mouth daily. 30 tablet 11  . traZODone (DESYREL) 50 MG tablet TAKE 1/2 TO 1 TABLET AT BEDTIME AS NEEDED FOR SLEEP 90 tablet 1  . atorvastatin (LIPITOR) 10 MG tablet Take 1 tablet (10 mg total) by mouth daily. (Patient not taking: Reported on 06/04/2020) 90 tablet 3  . glipiZIDE (GLUCOTROL XL) 10 MG 24 hr tablet TAKE 1 TABLET BY MOUTH EVERY DAY WITH BREAKFAST (Patient not taking: Reported on 06/04/2020) 90 tablet 0  . liraglutide (VICTOZA) 18 MG/3ML SOPN INJ  Subcut.1 mls daily x 1 wk then  0.2 MLS (1.2 MG TOTAL) INTO THE SKIN EVERY MORNING. (Patient not taking: Reported on 06/04/2020) 18 pen 5  . [DISCONTINUED] nystatin cream (MYCOSTATIN) Apply 1 application topically 2 (two) times daily. 30 g 1   No current facility-administered medications on file prior to visit.    Allergies  Allergen Reactions  . Metformin And Related Diarrhea  . Zoloft [Sertraline Hcl] Palpitations and Other (See Comments)    Headache    Social History:  reports that she has never smoked. She has never used smokeless tobacco. She reports that she does not drink alcohol and does not use drugs.  Family History  Problem Relation Age of Onset  . Diabetes Mother   . Hyperlipidemia Mother   . Diabetes Father   . Asthma Son   . Diabetes Paternal Grandmother   . Diabetes Paternal Grandfather   . Kidney disease Paternal Grandfather   . Schizophrenia Maternal Grandmother     The following portions of the patient's history were reviewed and updated as appropriate: allergies, current medications, past family history, past medical history, past social history, past surgical history and problem list.  Review of Systems Pertinent items noted in HPI and remainder of comprehensive ROS otherwise negative.  Physical Exam:  BP (!) 152/97 (BP Location: Right Arm)  Pulse 87   Ht 5\' 3"  (1.6 m)   Wt 212 lb 12.8 oz (96.5 kg)   LMP 05/24/2020 (Exact Date)   BMI 37.70 kg/m  CONSTITUTIONAL: Well-developed, well-nourished female in no acute distress.  HENT:  Normocephalic, atraumatic, External right and left ear normal. Oropharynx is clear and moist EYES: Conjunctivae and EOM are normal. Pupils are equal, round, and reactive to light. No scleral icterus.  NECK: Normal range of motion, supple, no masses.  Normal thyroid.  SKIN: Skin is warm and dry. No rash noted. Not diaphoretic. No erythema. No pallor. MUSCULOSKELETAL: Normal range of motion. No tenderness.  No cyanosis, clubbing,  or edema.  2+ distal pulses. NEUROLOGIC: Alert and oriented to person, place, and time. Normal reflexes, muscle tone coordination.  PSYCHIATRIC: Normal mood and affect. Normal behavior. Normal judgment and thought content. CARDIOVASCULAR: Normal heart rate noted, regular rhythm RESPIRATORY: Clear to auscultation bilaterally. Effort and breath sounds normal, no problems with respiration noted. BREASTS: Symmetric in size. No masses, tenderness, skin changes, nipple drainage, or lymphadenopathy bilaterally. Performed in the presence of a chaperone. ABDOMEN: Soft, obese, no distention noted.  No tenderness, rebound or guarding.  PELVIC: Normal appearing external genitalia and urethral meatus has diffuse erythematous inflammation noted on her vulva, perineal area and bilateral inner thighs concerning for skin yeast infection.  Normal appearing vaginal mucosa and cervix. White discharge noted, sample obtained.  Pap smear obtained.  Normal uterine size, no other palpable masses, no uterine or adnexal tenderness.  Performed in the presence of a chaperone.   Assessment and Plan:      1. Flu vaccine need - Flu Vaccine QUAD 36+ mos IM (Fluarix, Quad PF)  2. Yeast infection of the skin Due to Type II DM and habitus.  Antifungals prescribed, also steroid cream for inflammation. Advised to continue glycemic control, advocated for weight loss. Also may need referral to Dermatology givne extent of skin involvement to see if they have any other ideas for this chronic issue.  - fluconazole (DIFLUCAN) 150 MG tablet; Take 1 tablet (150 mg total) by mouth every three (3) days as needed.  Dispense: 3 tablet; Refill: 3 - nystatin-triamcinolone (MYCOLOG II) cream; Apply 1 application topically 2 (two) times daily.  Dispense: 60 g; Refill: 2  3. Recurrent candidiasis of vagina See #2 above.  Discussed possible boric acid therapy for vaginal pH optimization.  Proper vulvar hygiene emphasized: discussed avoidance of  perfumed soaps, detergents, lotions and any type of douches; in addition to wearing cotton underwear and no underwear at night.  Also recommended cleaning front to back, voiding and cleaning up after intercourse.  Will follow up labs and treat accordingly.  - Cervicovaginal ancillary only( [place]) - fluconazole (DIFLUCAN) 150 MG tablet; Take 1 tablet (150 mg total) by mouth every three (3) days as needed.  Dispense: 3 tablet; Refill: 3  4. Well woman exam with routine gynecological exam - Cytology - PAP - Hepatitis B surface antigen - Hepatitis C antibody - RPR - HIV Antibody (routine testing w rflx) Will follow up results of pap smear and STI screen and manage accordingly. Routine preventative health maintenance measures emphasized. Please refer to After Visit Summary for other counseling recommendations.      07/24/2020, MD, FACOG Obstetrician & Gynecologist, [facility] for [facility]

## 2020-06-05 LAB — HIV ANTIBODY (ROUTINE TESTING W REFLEX): HIV Screen 4th Generation wRfx: NONREACTIVE

## 2020-06-05 LAB — HEPATITIS B SURFACE ANTIGEN: Hepatitis B Surface Ag: NEGATIVE

## 2020-06-05 LAB — HEPATITIS C ANTIBODY: Hep C Virus Ab: <0.1 s/co ratio (ref 0.0–0.9)

## 2020-06-05 LAB — RPR: RPR Ser Ql: NONREACTIVE

## 2020-06-06 ENCOUNTER — Telehealth: Payer: Self-pay | Admitting: *Deleted

## 2020-06-06 LAB — CERVICOVAGINAL ANCILLARY ONLY
Bacterial Vaginitis (gardnerella): POSITIVE — AB
Candida Glabrata: POSITIVE — AB
Candida Vaginitis: POSITIVE — AB
Comment: NEGATIVE
Comment: NEGATIVE
Comment: NEGATIVE

## 2020-06-06 MED ORDER — BORIC ACID CRYS: 600.0000 mg | CRYSTALS | Freq: Every day | 5 refills | Status: DC

## 2020-06-06 MED ORDER — METRONIDAZOLE 500 MG PO TABS: 500.0000 mg | ORAL_TABLET | Freq: Two times a day (BID) | ORAL | 0 refills | Status: AC

## 2020-06-06 NOTE — Addendum Note (Signed)
Addended by: Jaynie Collins A on: 06/06/2020 03:02 PM   Modules accepted: Orders

## 2020-06-06 NOTE — Progress Notes (Signed)
Vaginal discharge test is abnormal and showed bacterial and yeast vaginitis, but the yeast is Candida glabrata which can be resistant to Diflucan.  She was already prescribed Diflucan and Mycolog (Nystatin-Triamcinolone) for the yeast infection of vulvovaginal region and surrounding skin.  Given her recurrent vulvovaginitis, will also prescribe extended vaginal Boric acid regimen and Boric acid is also effective against C.glabrata.  Patient also has bacterial vaginitis, Metronidazole prescribed.  The Boric acid regimen will help in re-establishing proper vaginal pH balance and can help prevent further recurrent yeast or bacterial vaginitis episodes.  This medication has to be used for months as prescribed. It is also a compounded medication, will send to [facility] (both medications will be sent there).  Please inform patient of results, advise to pick up prescribed medications and take as directed.   Jaynie Collins, MD

## 2020-06-06 NOTE — Telephone Encounter (Signed)
-----   Message from Tereso Newcomer, MD sent at 06/06/2020  3:01 PM EDT ----- Vaginal discharge test is abnormal and showed bacterial and yeast vaginitis, but the yeast is Candida glabrata which can be resistant to Diflucan.  She was already prescribed Diflucan and Mycolog (Nystatin-Triamcinolone) for the yeast infection of vulvovaginal region and surrounding skin.  Given her recurrent vulvovaginitis, will also prescribe extended vaginal Boric acid regimen and Boric acid is also effective against C.glabrata.  Patient also has bacterial vaginitis, Metronidazole prescribed.  The Boric acid regimen will help in re-establishing proper vaginal pH balance and can help prevent further recurrent yeast or bacterial vaginitis episodes.  This medication has to be used for months as prescribed. It is also a compounded medication, will send to [facility] (both medications will be sent there).  Please inform patient of results, advise to pick up prescribed medications and take as directed.   Jaynie Collins, MD

## 2020-06-06 NOTE — Telephone Encounter (Signed)
Called pt to go over results, pt was on her way to pick up meds now and did not have any questions at this time.

## 2020-06-14 LAB — CYTOLOGY - PAP
Chlamydia: NEGATIVE
Comment: NEGATIVE
Comment: NEGATIVE
Comment: NEGATIVE
Comment: NEGATIVE
Comment: NORMAL
Diagnosis: NEGATIVE
HSV1: NEGATIVE
HSV2: NEGATIVE
High risk HPV: NEGATIVE
Neisseria Gonorrhea: NEGATIVE
Trichomonas: NEGATIVE

## 2020-06-23 ENCOUNTER — Other Ambulatory Visit: Payer: Self-pay | Admitting: Internal Medicine

## 2020-06-24 NOTE — Telephone Encounter (Signed)
Requested Prescriptions  Pending Prescriptions Disp Refills  . traZODone (DESYREL) 50 MG tablet [Pharmacy Med Name: TRAZODONE 50 MG TABLET] 30 tablet 0    Sig: TAKE 1/2 TO 1 TABLET BY MOUTH AT BEDTIME AS NEEDED FOR SLEEP     Psychiatry: Antidepressants - Serotonin Modulator Failed - 06/23/2020  8:03 PM      Failed - Valid encounter within last 6 months    Recent Outpatient Visits          1 year ago Controlled type 2 diabetes mellitus without complication, without long-term current use of insulin (HCC)   [facility] Jonah Blue B, MD   1 year ago Uncontrolled diabetes mellitus type 2 without complications [facility])   [facility] Jonah Blue B, MD   2 years ago Uncontrolled type 2 diabetes mellitus with complication, without long-term current use of insulin (HCC)   [facility], Lonna Cobb, PA-C   3 years ago Uncontrolled type 2 diabetes mellitus with complication, without long-term current use of insulin (HCC)   [facility], Lonna Cobb, PA-C   3 years ago Type 2 diabetes mellitus without complication, without long-term current use of insulin [facility])    [facility], Lonna Cobb, PA-C      Future Appointments            In 3 weeks Marcine Matar, MD [facility]

## 2020-06-25 ENCOUNTER — Encounter: Payer: Self-pay | Admitting: Internal Medicine

## 2020-07-16 ENCOUNTER — Other Ambulatory Visit: Payer: Self-pay | Admitting: Internal Medicine

## 2020-07-18 ENCOUNTER — Ambulatory Visit: Payer: BC Managed Care – PPO | Attending: Internal Medicine | Admitting: Internal Medicine

## 2020-07-18 ENCOUNTER — Encounter: Payer: Self-pay | Admitting: Internal Medicine

## 2020-07-18 ENCOUNTER — Ambulatory Visit (HOSPITAL_BASED_OUTPATIENT_CLINIC_OR_DEPARTMENT_OTHER): Payer: BC Managed Care – PPO | Admitting: Pharmacist

## 2020-07-18 ENCOUNTER — Other Ambulatory Visit: Payer: Self-pay

## 2020-07-18 VITALS — BP 164/108 | HR 84 | Resp 16 | Ht 63.0 in | Wt 213.6 lb

## 2020-07-18 DIAGNOSIS — E669 Obesity, unspecified: Secondary | ICD-10-CM

## 2020-07-18 DIAGNOSIS — Z6379 Other stressful life events affecting family and household: Secondary | ICD-10-CM | POA: Diagnosis not present

## 2020-07-18 DIAGNOSIS — E1169 Type 2 diabetes mellitus with other specified complication: Secondary | ICD-10-CM | POA: Diagnosis not present

## 2020-07-18 DIAGNOSIS — E119 Type 2 diabetes mellitus without complications: Secondary | ICD-10-CM

## 2020-07-18 DIAGNOSIS — Z7189 Other specified counseling: Secondary | ICD-10-CM

## 2020-07-18 DIAGNOSIS — I10 Essential (primary) hypertension: Secondary | ICD-10-CM | POA: Diagnosis not present

## 2020-07-18 DIAGNOSIS — E7800 Pure hypercholesterolemia, unspecified: Secondary | ICD-10-CM | POA: Diagnosis not present

## 2020-07-18 LAB — POCT GLYCOSYLATED HEMOGLOBIN (HGB A1C): HbA1c, POC (controlled diabetic range): 13.9 % — AB (ref 0.0–7.0)

## 2020-07-18 LAB — GLUCOSE, POCT (MANUAL RESULT ENTRY): POC Glucose: 377 mg/dl — AB (ref 70–99)

## 2020-07-18 MED ORDER — TRULICITY 0.75 MG/0.5ML ~~LOC~~ SOAJ: 0.7500 mg | SUBCUTANEOUS | 5 refills | Status: DC

## 2020-07-18 MED ORDER — ATORVASTATIN CALCIUM 10 MG PO TABS: 10.0000 mg | ORAL_TABLET | Freq: Every day | ORAL | 3 refills | Status: DC

## 2020-07-18 MED ORDER — AMLODIPINE BESYLATE 5 MG PO TABS: 5.0000 mg | ORAL_TABLET | Freq: Every day | ORAL | 3 refills | Status: DC

## 2020-07-18 MED ORDER — LANTUS SOLOSTAR 100 UNIT/ML ~~LOC~~ SOPN: 12.0000 [IU] | PEN_INJECTOR | Freq: Every day | SUBCUTANEOUS | 5 refills | Status: DC

## 2020-07-18 MED ORDER — LISINOPRIL 5 MG PO TABS: 5.0000 mg | ORAL_TABLET | Freq: Every day | ORAL | 3 refills | Status: DC

## 2020-07-18 NOTE — Patient Instructions (Signed)

## 2020-07-18 NOTE — Progress Notes (Signed)
Patient was educated on the use of the Trulicity and Lantus pens. Reviewed necessary supplies and operation of the pens. Also reviewed goal blood glucose levels. Patient was able to demonstrate use. All questions and concerns were addressed.  Butch Penny, PharmD, CPP Clinical Pharmacist [facility] [phone]

## 2020-07-18 NOTE — Progress Notes (Signed)
Patient ID: Brittany [name], female    DOB: November 06, 1975  MRN: [id number]  CC: Diabetes, Shoulder Pain, and Hypertension   Subjective: Brittany [name] is a 44 y.o. 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 It has been overwhelming.  Mom has an aid who comes in for 3 hrs a day.  Her siblings offer no assistance. Patient has been off meds x 3-4 mths.  "I have just been neglecting myself." Not sleeping well.  Takes Trazodone but not every night because it makes her sleep well and feels she needs to listen out for her mom.   Reached out to aunt to help with care of her mom but has not heard back from her. Use to see a therapist in the past whom she liked and was very helpful. Tried to get in with her again but told by practice that she is no longer there.  Plans to look her up on-line and if she cannot locate her then she will call back to let me know to submit a referral to another therapist..  HYPERTENSION Currently taking: see medication list.  Was on Norvasc and Lisinopril on last visit.  Out x 4 mths for the reasons stated above. Adherence with salt restriction: []  Yes    []  No Home Monitoring?: []  Yes    [x]  No but does have device SOB? []  Yes    [x]  No Chest Pain?: []  Yes    [x]  No Leg swelling?: []  Yes    [x]  No Headaches?: []  Yes    [x]  No Dizziness? []  Yes    [x]  No Comments:   DIABETES TYPE 2 Last A1C:   Results for orders placed or performed in visit on 07/18/20  POCT glucose (manual entry)  Result Value Ref Range   POC Glucose 377 (A) 70 - 99 mg/dl  POCT glycosylated hemoglobin (Hb A1C)  Result Value Ref Range   Hemoglobin A1C     HbA1c POC (<> result, manual entry)     HbA1c, POC (prediabetic range)     HbA1c, POC (controlled diabetic range) 13.9 (A) 0.0 - 7.0 %    Med Adherence:  []  Yes    [x]  No.  On last visit with  me she was on Trulicity and glipizide.  She has been out of both of them for several months for the same reason stated above. Medication side effects:  []  Yes    []  No Home Monitoring?  []  Yes    [x]  No but does have diabetic testing supplies. Diet Adherence: []  Yes    [x]  No Exercise: []  Yes    [x]  Not much Hypoglycemic episodes?: []  Yes    []  No Numbness of the feet? []  Yes    []  No Retinopathy hx? []  Yes    [x]  No Last eye exam:  July 2021, new glasses at [facility]. Comments: no polyuria or polydipsia.  No wgh changes since Sept  HL: Off atorvastatin for several months for the same reason that she has been off all of her other medicines.  She wants to know whether taking an aspirin daily would be advisable for her.   Patient Active Problem List   Diagnosis Date Noted  . Class 3 severe obesity due to excess calories without serious comorbidity with body mass index (BMI) of 40.0 to 44.9 in adult [facility])  09/29/2018  . Hives 09/29/2018  . Stressful life event affecting family 09/29/2018  . PTSD (post-traumatic stress disorder) 01/04/2015  . Hepatic steatosis 05/16/2014  . Uncontrolled type 2 diabetes mellitus with complication, without long-term current use of insulin (HCC) 05/01/2014  . HTN (hypertension) 01/01/2014     Current Outpatient Medications on File Prior to Visit  Medication Sig Dispense Refill  . Boric Acid CRYS Place 600 mg vaginally at bedtime. Use vaginally every night for two weeks then twice a week for six months 500 g 5  . nystatin-triamcinolone (MYCOLOG II) cream Apply 1 application topically 2 (two) times daily. 60 g 2  . traZODone (DESYREL) 50 MG tablet TAKE 1/2 TO 1 TABLET BY MOUTH AT BEDTIME AS NEEDED FOR SLEEP 30 tablet 0  . [DISCONTINUED] nystatin cream (MYCOSTATIN) Apply 1 application topically 2 (two) times daily. 30 g 1   No current facility-administered medications on file prior to visit.    Allergies  Allergen Reactions  . Metformin And Related  Diarrhea  . Zoloft [Sertraline Hcl] Palpitations and Other (See Comments)    Headache    Social History   Socioeconomic History  . Marital status: Single    Spouse name: Not on file  . Number of children: Not on file  . Years of education: Not on file  . Highest education level: Not on file  Occupational History  . Not on file  Tobacco Use  . Smoking status: Never Smoker  . Smokeless tobacco: Never Used  Vaping Use  . Vaping Use: Never used  Substance and Sexual Activity  . Alcohol use: No  . Drug use: No  . Sexual activity: Yes    Partners: Male    Birth control/protection: None, Surgical  Other Topics Concern  . Not on file  Social History Narrative  . Not on file   Social Determinants of Health   Financial Resource Strain:   . Difficulty of Paying Living Expenses: Not on file  Food Insecurity:   . Worried About Programme researcher, broadcasting/film/video in the Last Year: Not on file  . Ran Out of Food in the Last Year: Not on file  Transportation Needs:   . Lack of Transportation (Medical): Not on file  . Lack of Transportation (Non-Medical): Not on file  Physical Activity:   . Days of Exercise per Week: Not on file  . Minutes of Exercise per Session: Not on file  Stress:   . Feeling of Stress : Not on file  Social Connections:   . Frequency of Communication with Friends and Family: Not on file  . Frequency of Social Gatherings with Friends and Family: Not on file  . Attends Religious Services: Not on file  . Active Member of Clubs or Organizations: Not on file  . Attends Banker Meetings: Not on file  . Marital Status: Not on file  Intimate Partner Violence:   . Fear of Current or Ex-Partner: Not on file  . Emotionally Abused: Not on file  . Physically Abused: Not on file  . Sexually Abused: Not on file    Family History  Problem Relation Age of Onset  . Diabetes Mother   . Hyperlipidemia Mother   . Diabetes Father   . Asthma Son   . Diabetes Paternal  Grandmother   . Diabetes Paternal Grandfather   . Kidney disease Paternal Grandfather   . Schizophrenia Maternal Grandmother     Past Surgical History:  Procedure Laterality Date  . TUBAL LIGATION  ROS: Review of Systems Negative except as stated above  PHYSICAL EXAM: BP (!) 164/108   Pulse 84   Resp 16   Ht 5\' 3"  (1.6 m)   Wt 213 lb 9.6 oz (96.9 kg)   SpO2 97%   BMI 37.84 kg/m   Wt Readings from Last 3 Encounters:  07/18/20 213 lb 9.6 oz (96.9 kg)  06/04/20 212 lb 12.8 oz (96.5 kg)  10/13/18 225 lb (102.1 kg)    Physical Exam General appearance - alert, well appearing, and in no distress Mental status - normal mood, behavior, speech, dress, motor activity, and thought processes Neck - supple, no significant adenopathy Chest - clear to auscultation, no wheezes, rales or rhonchi, symmetric air entry Heart - normal rate, regular rhythm, normal S1, S2, no murmurs, rubs, clicks or gallops Extremities - peripheral pulses normal, no pedal edema, no clubbing or cyanosis Diabetic Foot Exam - Simple   Simple Foot Form Visual Inspection No deformities, no ulcerations, no other skin breakdown bilaterally: Yes Sensation Testing Intact to touch and monofilament testing bilaterally: Yes Pulse Check Posterior Tibialis and Dorsalis pulse intact bilaterally: Yes Comments     Depression screen [facility] 2/9 07/18/2020 09/29/2018 07/14/2018  Decreased Interest 0 1 0  Down, Depressed, Hopeless 0 0 0  PHQ - 2 Score 0 1 0  Some encounter information is confidential and restricted. Go to Review Flowsheets activity to see all data.  Some recent data might be hidden    CMP Latest Ref Rng & Units 07/21/2018 09/11/2016 08/10/2016  Glucose 65 - 99 mg/dL 08/12/2016) 630(Z) 601(U)  BUN 6 - 24 mg/dL 13 9 12   Creatinine 0.57 - 1.00 mg/dL 932(T 5.57  Sodium 134 - 144 mmol/L 136 142 139  Potassium 3.5 - 5.2 mmol/L 4.1 4.2 4.2  Chloride 96 - 106 mmol/L 96 109 105  CO2 20 - 29 mmol/L 22 23 26     Calcium 8.7 - 10.2 mg/dL 9.5 9.0 9.1  Total Protein 6.0 - 8.5 g/dL 7.4 6.8 7.3  Total Bilirubin 0.0 - 1.2 mg/dL 0.3 0.3 0.3  Alkaline Phos 39 - 117 IU/L 79 60 60  AST 0 - 40 IU/L 23 26 37(H)  ALT 0 - 32 IU/L 33(H) 50(H) 66(H)   Lipid Panel     Component Value Date/Time   CHOL 226 (H) 07/21/2018 1130   TRIG 92 07/21/2018 1130   HDL 63 07/21/2018 1130   CHOLHDL 3.6 07/21/2018 1130   CHOLHDL 4.0 11/07/2015 1407   VLDL 45 (H) 11/07/2015 1407   LDLCALC 145 (H) 07/21/2018 1130    CBC    Component Value Date/Time   WBC 7.8 07/21/2018 1130   WBC 10.1 11/07/2015 1407   RBC 6.07 (H) 07/21/2018 1130   RBC 5.74 (H) 11/07/2015 1407   HGB 13.5 07/21/2018 1130   HCT 44.7 07/21/2018 1130   PLT 354 07/21/2018 1130   MCV 74 (L) 07/21/2018 1130   MCH 22.2 (L) 07/21/2018 1130   MCH 23.2 (L) 11/07/2015 1407   MCHC 30.2 (L) 07/21/2018 1130   MCHC 30.9 11/07/2015 1407   RDW 13.7 07/21/2018 1130    ASSESSMENT AND PLAN: 1. Type 2 diabetes mellitus with obesity (HCC) Not at goal due to her being off medicines for 4 months.  Dietary compliance encouraged.  Printed information given.  Encouraged her to try to exercise and move as much as she can. Instead of restarting Victoza, she is agreeable to trying Trulicity instead since it is a once weekly  injection.  I went over how the medication works and possible side effects including nausea/vomiting.  If she develops any nausea/vomiting or abdominal pain she will stop the medicine and let me know.  Instead of glipizide, I recommend Lantus insulin 12 units at bedtime for status.  Patient is agreeable to this.  Clinical pharmacist to do insulin and Trulicity administration teaching with her today. Advised to check blood sugars at least twice a day before meals and bring in readings on next visit with me in several weeks. - POCT glucose (manual entry) - POCT glycosylated hemoglobin (Hb A1C) - Microalbumin / creatinine urine ratio - CBC - Comprehensive  metabolic panel - insulin glargine (LANTUS SOLOSTAR) 100 UNIT/ML Solostar Pen; Inject 12 Units into the skin daily.  Dispense: 15 mL; Refill: 5 - Dulaglutide (TRULICITY) 0.75 MG/0.5ML SOPN; Inject 0.75 mg into the skin once a week.  Dispense: 2 mL; Refill: 5 - amLODipine (NORVASC) 5 MG tablet; Take 1 tablet (5 mg total) by mouth daily.  Dispense: 30 tablet; Refill: 3 - lisinopril (ZESTRIL) 5 MG tablet; Take 1 tablet (5 mg total) by mouth daily.  Dispense: 90 tablet; Refill: 3  2. Essential hypertension Not at goal due to her being off medicines for several months. Restart amlodipine and lisinopril. Encourage DASH diet. Encouraged her to check blood pressure at least once or twice a week with goal being 130/80 or lower.  3. Stressful life event affecting family Advised patient of the importance of self-care as she tries to administer care to her mother who has dementia.  I agree that she would benefit from some counseling.  She plans to try to find her previous counselor.  If not she will let me know so that I can submit a referral to a different counselor.  Denies any issues with depression at this time.  4. Pure hypercholesterolemia - Lipid panel - atorvastatin (LIPITOR) 10 MG tablet; Take 1 tablet (10 mg total) by mouth daily.  Dispense: 30 tablet; Refill: 3     Patient was given the opportunity to ask questions.  Patient verbalized understanding of the plan and was able to repeat key elements of the plan.   Orders Placed This Encounter  Procedures  . Microalbumin / creatinine urine ratio  . CBC  . Comprehensive metabolic panel  . Lipid panel  . POCT glucose (manual entry)  . POCT glycosylated hemoglobin (Hb A1C)     Requested Prescriptions   Signed Prescriptions Disp Refills  . insulin glargine (LANTUS SOLOSTAR) 100 UNIT/ML Solostar Pen 15 mL 5    Sig: Inject 12 Units into the skin daily.  . Dulaglutide (TRULICITY) 0.75 MG/0.5ML SOPN 2 mL 5    Sig: Inject 0.75 mg into the  skin once a week.  Marland Kitchen. atorvastatin (LIPITOR) 10 MG tablet 30 tablet 3    Sig: Take 1 tablet (10 mg total) by mouth daily.  Marland Kitchen. amLODipine (NORVASC) 5 MG tablet 30 tablet 3    Sig: Take 1 tablet (5 mg total) by mouth daily.  Marland Kitchen. lisinopril (ZESTRIL) 5 MG tablet 90 tablet 3    Sig: Take 1 tablet (5 mg total) by mouth daily.    Return in about 6 weeks (around 08/29/2020).  Jonah Blueeborah Amarria Andreasen, MD, FACP

## 2020-07-19 ENCOUNTER — Telehealth: Payer: Self-pay

## 2020-07-19 LAB — COMPREHENSIVE METABOLIC PANEL
ALT: 60 IU/L — ABNORMAL HIGH (ref 0–32)
AST: 28 IU/L (ref 0–40)
Albumin/Globulin Ratio: 1.6 (ref 1.2–2.2)
Albumin: 4.4 g/dL (ref 3.8–4.8)
Alkaline Phosphatase: 99 IU/L (ref 44–121)
BUN/Creatinine Ratio: 13 (ref 9–23)
BUN: 9 mg/dL (ref 6–24)
Bilirubin Total: 0.3 mg/dL (ref 0.0–1.2)
CO2: 25 mmol/L (ref 20–29)
Calcium: 9.5 mg/dL (ref 8.7–10.2)
Chloride: 99 mmol/L (ref 96–106)
Creatinine, Ser: 0.68 mg/dL (ref 0.57–1.00)
GFR calc Af Amer: 123 mL/min/{1.73_m2} (ref >59)
GFR calc non Af Amer: 107 mL/min/{1.73_m2} (ref >59)
Globulin, Total: 2.7 g/dL (ref 1.5–4.5)
Glucose: 339 mg/dL — ABNORMAL HIGH (ref 65–99)
Potassium: 4.1 mmol/L (ref 3.5–5.2)
Sodium: 138 mmol/L (ref 134–144)
Total Protein: 7.1 g/dL (ref 6.0–8.5)

## 2020-07-19 LAB — CBC
Hematocrit: 44.4 % (ref 34.0–46.6)
Hemoglobin: 13 g/dL (ref 11.1–15.9)
MCH: 21.9 pg — ABNORMAL LOW (ref 26.6–33.0)
MCHC: 29.3 g/dL — ABNORMAL LOW (ref 31.5–35.7)
MCV: 75 fL — ABNORMAL LOW (ref 79–97)
Platelets: 309 10*3/uL (ref 150–450)
RBC: 5.94 x10E6/uL — ABNORMAL HIGH (ref 3.77–5.28)
RDW: 15.6 % — ABNORMAL HIGH (ref 11.7–15.4)
WBC: 7.8 10*3/uL (ref 3.4–10.8)

## 2020-07-19 LAB — LIPID PANEL
Chol/HDL Ratio: 4 ratio (ref 0.0–4.4)
Cholesterol, Total: 246 mg/dL — ABNORMAL HIGH (ref 100–199)
HDL: 61 mg/dL (ref >39)
LDL Chol Calc (NIH): 163 mg/dL — ABNORMAL HIGH (ref 0–99)
Triglycerides: 124 mg/dL (ref 0–149)
VLDL Cholesterol Cal: 22 mg/dL (ref 5–40)

## 2020-07-19 LAB — MICROALBUMIN / CREATININE URINE RATIO
Creatinine, Urine: 48.1 mg/dL
Microalb/Creat Ratio: 7 mg/g creat (ref 0–29)
Microalbumin, Urine: 3.4 ug/mL

## 2020-07-19 NOTE — Telephone Encounter (Signed)
Trulicity prior Berkley Harvey has been approved thru insurance thru 07/19/21

## 2020-07-21 ENCOUNTER — Other Ambulatory Visit: Payer: Self-pay | Admitting: Internal Medicine

## 2020-07-21 MED ORDER — PEN NEEDLES 31G X 8 MM MISC: 6 refills | Status: DC

## 2020-07-22 ENCOUNTER — Other Ambulatory Visit: Payer: Self-pay | Admitting: Internal Medicine

## 2020-08-06 ENCOUNTER — Encounter: Payer: Self-pay | Admitting: Internal Medicine

## 2020-08-07 ENCOUNTER — Other Ambulatory Visit: Payer: Self-pay | Admitting: Pharmacist

## 2020-08-07 DIAGNOSIS — E669 Obesity, unspecified: Secondary | ICD-10-CM

## 2020-08-07 DIAGNOSIS — E1169 Type 2 diabetes mellitus with other specified complication: Secondary | ICD-10-CM

## 2020-08-07 MED ORDER — GLUCOSE BLOOD VI STRP: ORAL_STRIP | 2 refills | Status: DC

## 2020-08-07 MED ORDER — CONTOUR NEXT MONITOR W/DEVICE KIT: PACK | 0 refills | Status: DC

## 2020-08-07 MED ORDER — ONETOUCH VERIO FLEX SYSTEM W/DEVICE KIT: PACK | 0 refills | Status: DC

## 2020-08-07 MED ORDER — ONETOUCH DELICA PLUS LANCET30G MISC: 2 refills | Status: DC

## 2020-08-07 MED ORDER — MICROLET LANCETS MISC: 2 refills | Status: DC

## 2020-08-07 MED ORDER — ONETOUCH VERIO VI STRP: ORAL_STRIP | 2 refills | Status: DC

## 2020-08-29 ENCOUNTER — Ambulatory Visit: Payer: BC Managed Care – PPO | Admitting: Internal Medicine

## 2020-09-09 ENCOUNTER — Encounter: Payer: Self-pay | Admitting: Internal Medicine

## 2020-09-09 DIAGNOSIS — E669 Obesity, unspecified: Secondary | ICD-10-CM

## 2020-09-09 MED ORDER — ONETOUCH VERIO VI STRP: ORAL_STRIP | 2 refills | Status: DC

## 2020-09-09 MED ORDER — LANCETS MISC: 5 refills | Status: DC

## 2020-09-10 ENCOUNTER — Other Ambulatory Visit: Payer: Self-pay | Admitting: Obstetrics & Gynecology

## 2020-09-10 DIAGNOSIS — B3731 Acute candidiasis of vulva and vagina: Secondary | ICD-10-CM

## 2020-09-10 DIAGNOSIS — B372 Candidiasis of skin and nail: Secondary | ICD-10-CM

## 2020-10-16 ENCOUNTER — Other Ambulatory Visit: Payer: Self-pay | Admitting: Internal Medicine

## 2020-10-16 DIAGNOSIS — E7800 Pure hypercholesterolemia, unspecified: Secondary | ICD-10-CM

## 2020-10-16 DIAGNOSIS — E1169 Type 2 diabetes mellitus with other specified complication: Secondary | ICD-10-CM

## 2020-10-16 DIAGNOSIS — E669 Obesity, unspecified: Secondary | ICD-10-CM

## 2020-10-18 ENCOUNTER — Ambulatory Visit: Payer: BC Managed Care – PPO | Attending: Internal Medicine | Admitting: Internal Medicine

## 2020-10-18 ENCOUNTER — Other Ambulatory Visit: Payer: Self-pay

## 2020-11-19 ENCOUNTER — Encounter: Payer: Self-pay | Admitting: Internal Medicine

## 2020-11-20 MED ORDER — VICTOZA 18 MG/3ML ~~LOC~~ SOPN: PEN_INJECTOR | SUBCUTANEOUS | 3 refills | Status: DC

## 2020-11-21 ENCOUNTER — Telehealth: Payer: Self-pay

## 2020-11-21 NOTE — Telephone Encounter (Signed)
PA approved for Victoza until 11/21/21

## 2020-11-27 ENCOUNTER — Encounter: Payer: Self-pay | Admitting: Internal Medicine

## 2020-11-29 NOTE — Telephone Encounter (Signed)
Patient called in checking on the status of 11/27/2020 My Chart message. PCP next available is not until May and would like to know if PCP would work her in as soon as possible best # to call back [phone]

## 2020-12-23 NOTE — Telephone Encounter (Signed)
Called patient and LVM apologizing for the extremely late follow up and advised her I was calling from [facility] in regards to getting an appointment scheduled. Advised patient to call [phone] to schedule.

## 2021-01-10 ENCOUNTER — Other Ambulatory Visit: Payer: Self-pay | Admitting: Internal Medicine

## 2021-01-10 DIAGNOSIS — E1169 Type 2 diabetes mellitus with other specified complication: Secondary | ICD-10-CM

## 2021-01-10 DIAGNOSIS — E7800 Pure hypercholesterolemia, unspecified: Secondary | ICD-10-CM

## 2021-01-10 NOTE — Telephone Encounter (Signed)
Requested medications are due for refill today yes  Requested medications are on the active medication list yes  Last refill 1/26  Last visit 07/18/20  Future visit scheduled no  Notes to clinic Was to return in 6 weeks and did not, was given curtesy refills 3 months ago, no upcoming visit scheduled.

## 2021-03-20 ENCOUNTER — Ambulatory Visit: Payer: BC Managed Care – PPO | Admitting: Internal Medicine

## 2021-03-26 ENCOUNTER — Other Ambulatory Visit: Payer: Self-pay

## 2021-03-26 ENCOUNTER — Encounter (HOSPITAL_COMMUNITY): Payer: Self-pay

## 2021-03-26 ENCOUNTER — Ambulatory Visit (HOSPITAL_COMMUNITY)
Admission: EM | Admit: 2021-03-26 | Discharge: 2021-03-26 | Disposition: A | Discharge status: Home / Self Care | Payer: BC Managed Care – PPO | Attending: Medical Oncology | Admitting: Medical Oncology

## 2021-03-26 DIAGNOSIS — M25512 Pain in left shoulder: Secondary | ICD-10-CM

## 2021-03-26 MED ORDER — MELOXICAM 7.5 MG PO TABS: 7.5000 mg | ORAL_TABLET | Freq: Every day | ORAL | 0 refills | Status: DC

## 2021-03-26 MED ORDER — METHOCARBAMOL 500 MG PO TABS: 500.0000 mg | ORAL_TABLET | Freq: Two times a day (BID) | ORAL | 0 refills | Status: DC
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
## 2021-04-04 ENCOUNTER — Ambulatory Visit: Payer: BC Managed Care – PPO | Admitting: Orthopaedic Surgery

## 2021-04-16 ENCOUNTER — Other Ambulatory Visit: Payer: Self-pay

## 2021-04-16 ENCOUNTER — Ambulatory Visit: Payer: Self-pay

## 2021-04-16 ENCOUNTER — Ambulatory Visit (INDEPENDENT_AMBULATORY_CARE_PROVIDER_SITE_OTHER): Payer: BC Managed Care – PPO | Admitting: Orthopaedic Surgery

## 2021-04-16 ENCOUNTER — Encounter: Payer: Self-pay | Admitting: Orthopaedic Surgery

## 2021-04-16 DIAGNOSIS — G8929 Other chronic pain: Secondary | ICD-10-CM | POA: Diagnosis not present

## 2021-04-16 DIAGNOSIS — M25512 Pain in left shoulder: Secondary | ICD-10-CM | POA: Diagnosis not present

## 2021-04-16 MED ORDER — METHYLPREDNISOLONE ACETATE 40 MG/ML IJ SUSP
40.0000 mg | INTRAMUSCULAR | Status: AC | PRN
  Administered 2021-04-16: 40 mg via INTRA_ARTICULAR

## 2021-04-16 MED ORDER — BUPIVACAINE HCL 0.25 % IJ SOLN
2.0000 mL | INTRAMUSCULAR | Status: AC | PRN
  Administered 2021-04-16: 2 mL via INTRA_ARTICULAR

## 2021-04-16 MED ORDER — LIDOCAINE HCL 2 % IJ SOLN
2.0000 mL | INTRAMUSCULAR | Status: AC | PRN
  Administered 2021-04-16: 2 mL
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
## 2021-04-22 ENCOUNTER — Encounter: Payer: Self-pay | Admitting: Radiology

## 2021-05-01 ENCOUNTER — Other Ambulatory Visit: Payer: Self-pay

## 2021-05-01 ENCOUNTER — Encounter: Payer: Self-pay | Admitting: Rehabilitative and Restorative Service Providers"

## 2021-05-01 ENCOUNTER — Ambulatory Visit (INDEPENDENT_AMBULATORY_CARE_PROVIDER_SITE_OTHER): Payer: BC Managed Care – PPO | Admitting: Rehabilitative and Restorative Service Providers"

## 2021-05-01 DIAGNOSIS — M25512 Pain in left shoulder: Secondary | ICD-10-CM

## 2021-05-01 DIAGNOSIS — M6281 Muscle weakness (generalized): Secondary | ICD-10-CM

## 2021-05-01 DIAGNOSIS — R293 Abnormal posture: Secondary | ICD-10-CM | POA: Diagnosis not present

## 2021-05-01 DIAGNOSIS — G8929 Other chronic pain: Secondary | ICD-10-CM
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
## 2021-05-01 NOTE — Patient Instructions (Signed)
Access Code: [email or web address] URL: [email or web address] Date: 05/01/2021 Prepared by: Chyrel Masson  Exercises Seated Scapular Retraction - 2 x daily - 7 x weekly - 10 reps - 2 sets - 5 hold Standing Circular Shoulder Pendulum Supported with Arm Bent - 2 x daily - 7 x weekly - 3 sets - 10 reps Standing Flexion Extension Shoulder Pendulum Supported with Arm Bent (Mirrored) - 2 x daily - 7 x weekly - 3 sets - 10 reps

## 2021-05-15 ENCOUNTER — Telehealth: Payer: Self-pay | Admitting: Rehabilitative and Restorative Service Providers"

## 2021-05-15 ENCOUNTER — Encounter: Payer: BC Managed Care – PPO | Admitting: Rehabilitative and Restorative Service Providers"
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
## 2021-05-16 ENCOUNTER — Ambulatory Visit: Payer: BC Managed Care – PPO | Admitting: Internal Medicine

## 2021-05-20 ENCOUNTER — Encounter: Payer: BC Managed Care – PPO | Admitting: Physical Therapy

## 2021-05-28 ENCOUNTER — Other Ambulatory Visit: Payer: Self-pay

## 2021-05-28 ENCOUNTER — Encounter: Payer: Self-pay | Admitting: Rehabilitative and Restorative Service Providers"

## 2021-05-28 ENCOUNTER — Ambulatory Visit (INDEPENDENT_AMBULATORY_CARE_PROVIDER_SITE_OTHER): Payer: BC Managed Care – PPO | Admitting: Rehabilitative and Restorative Service Providers"

## 2021-05-28 DIAGNOSIS — M25512 Pain in left shoulder: Secondary | ICD-10-CM

## 2021-05-28 DIAGNOSIS — R293 Abnormal posture: Secondary | ICD-10-CM

## 2021-05-28 DIAGNOSIS — G8929 Other chronic pain: Secondary | ICD-10-CM | POA: Diagnosis not present

## 2021-05-28 DIAGNOSIS — M6281 Muscle weakness (generalized): Secondary | ICD-10-CM | POA: Diagnosis not present
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
## 2021-05-28 NOTE — Patient Instructions (Signed)
Access Code: [email or web address] URL: [email or web address] Date: 05/28/2021 Prepared by: Chyrel Masson  Exercises Seated Scapular Retraction - 2 x daily - 7 x weekly - 10 reps - 2 sets - 5 hold Supine Shoulder Flexion Extension AAROM with Dowel - 2 x daily - 7 x weekly - 3 sets - 10 reps - 2-3 hold Supine Shoulder Flexion Extension Full Range AROM (Mirrored) - 2 x daily - 7 x weekly - 3 sets - 10 reps Standing Shoulder Row with Anchored Resistance - 2 x daily - 7 x weekly - 10 reps - 3 sets Shoulder External Rotation Reactive Isometrics - 2 x daily - 7 x weekly - 1 sets - 15 reps - 5 hold

## 2021-06-01 IMAGING — MG DIGITAL SCREENING BILAT W/ TOMO W/ CAD
8 of 14 series · 8 of 40 positions shown · non-contrast
Comparison: Previous exam(s).

CLINICAL DATA: Screening.

EXAM:
DIGITAL SCREENING BILATERAL MAMMOGRAM WITH TOMO AND CAD

[R XCCL synth-2D]
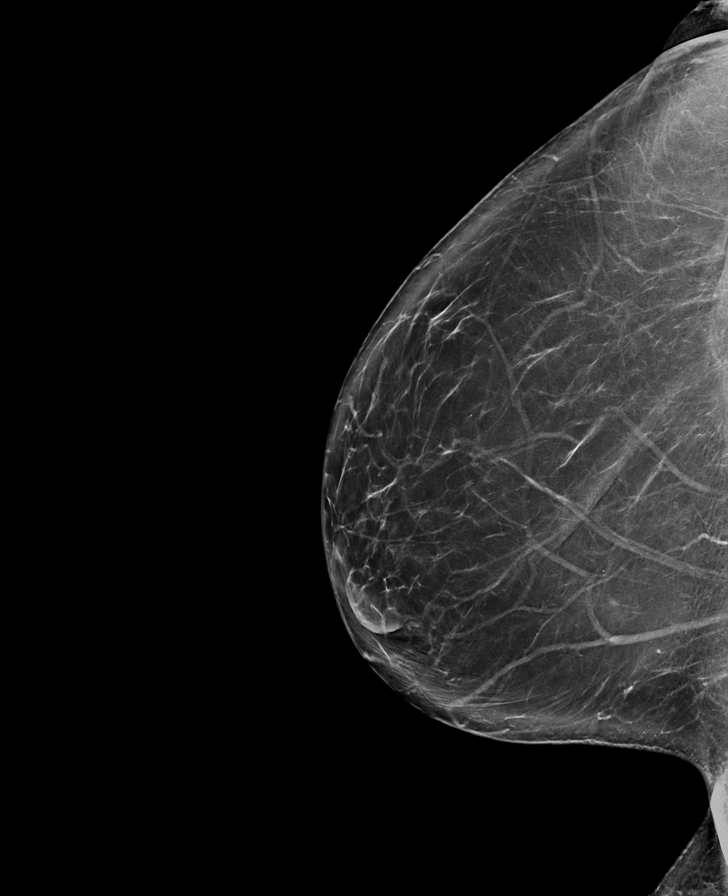

[L MLO synth-2D]
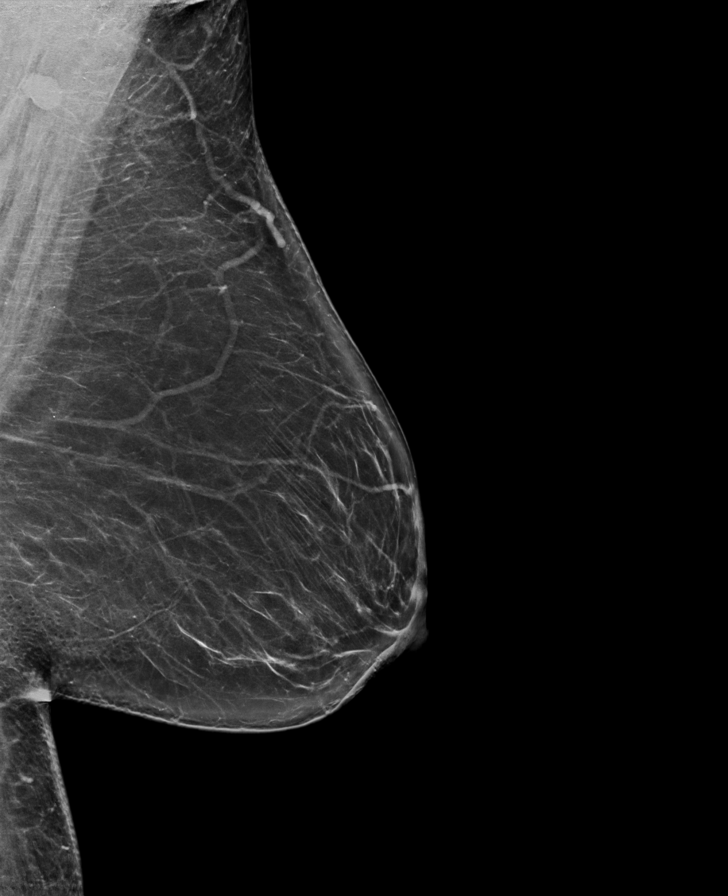

[R MLO synth-2D (1 of 2)]
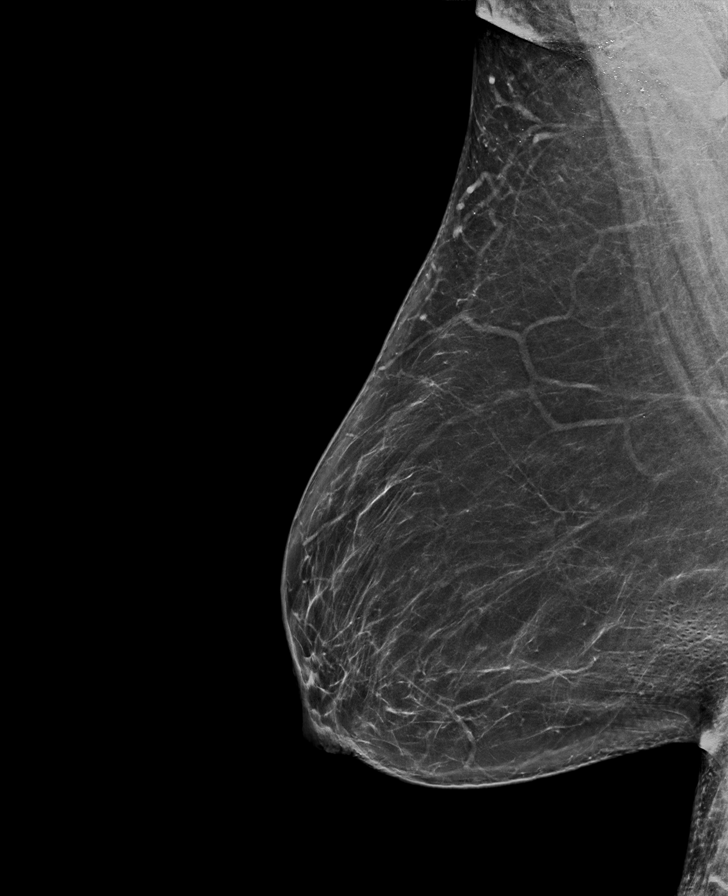

[L CC synth-2D]
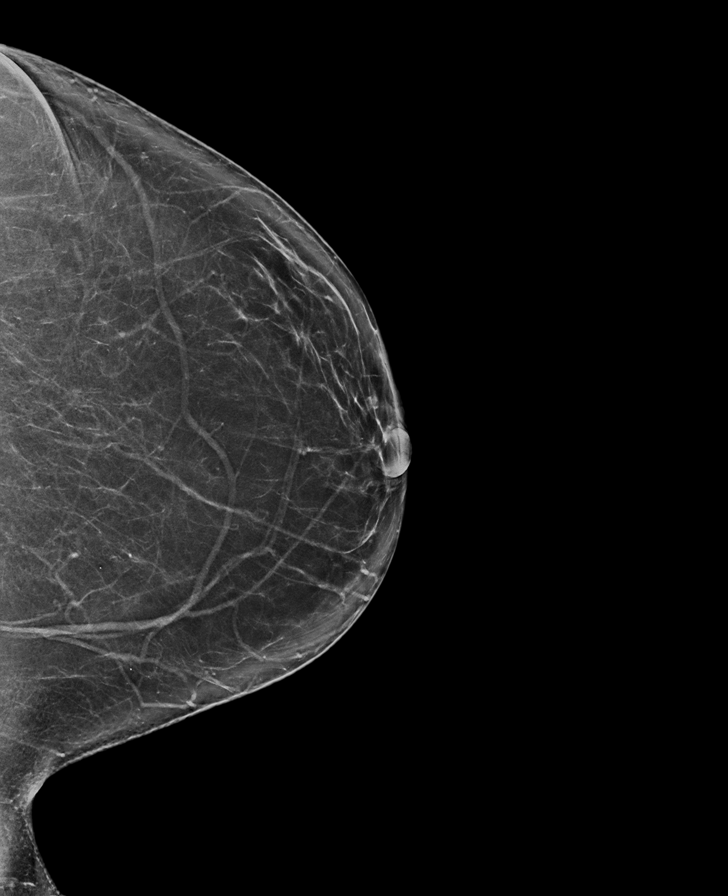

[R MLO synth-2D (2 of 2)]
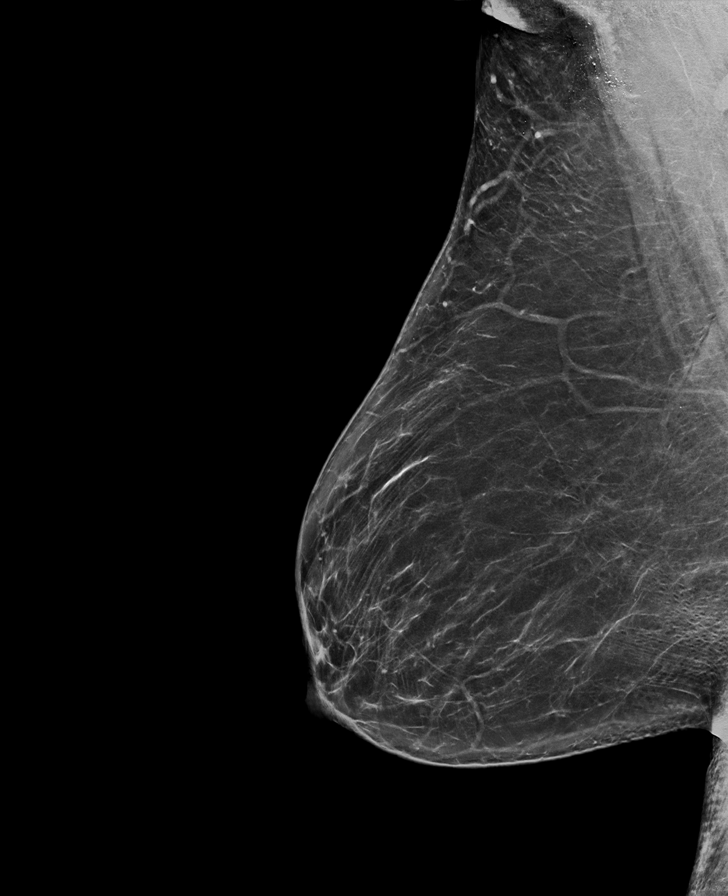

[R CC synth-2D]
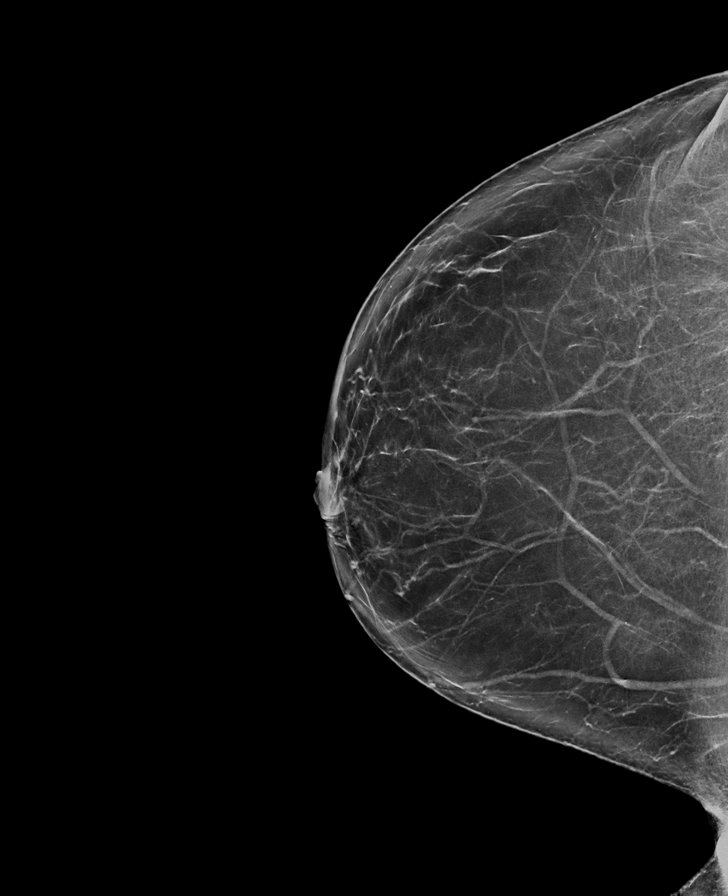

[L XCCL synth-2D]
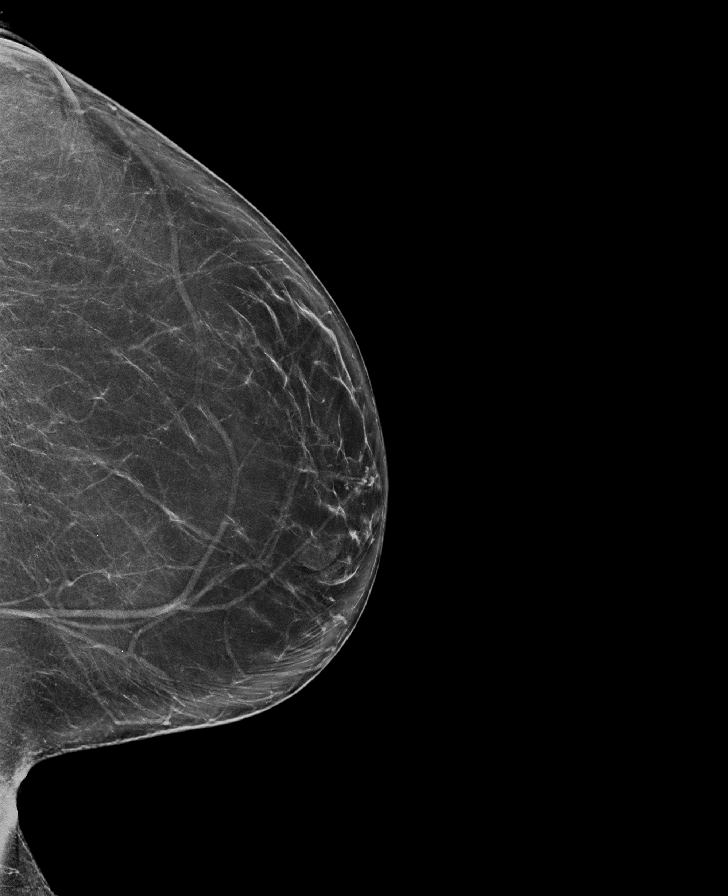

[R CC tomo · tomo slice 37/74.0]
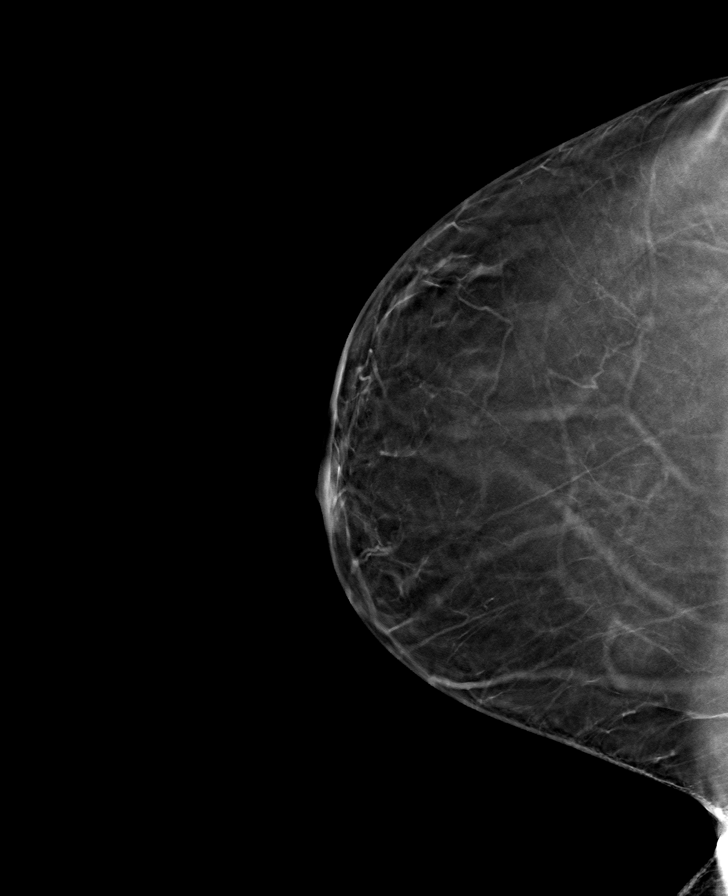

[8 of 40 positions shown; findings below may reference images not displayed]

ACR Breast Density Category b: There are scattered areas of
fibroglandular density.
FINDINGS: There are no findings suspicious for malignancy. Images were
processed with CAD.
IMPRESSION: No mammographic evidence of malignancy. A result letter of this
screening mammogram will be mailed directly to the patient.

RECOMMENDATION:
Screening mammogram in one year. (Code:[id number]-775)

BI-RADS CATEGORY  1: Negative.

## 2021-06-02 ENCOUNTER — Ambulatory Visit (INDEPENDENT_AMBULATORY_CARE_PROVIDER_SITE_OTHER): Payer: BC Managed Care – PPO | Admitting: Rehabilitative and Restorative Service Providers"

## 2021-06-02 ENCOUNTER — Other Ambulatory Visit: Payer: Self-pay

## 2021-06-02 ENCOUNTER — Encounter: Payer: Self-pay | Admitting: Rehabilitative and Restorative Service Providers"

## 2021-06-02 DIAGNOSIS — G8929 Other chronic pain: Secondary | ICD-10-CM | POA: Diagnosis not present

## 2021-06-02 DIAGNOSIS — R293 Abnormal posture: Secondary | ICD-10-CM

## 2021-06-02 DIAGNOSIS — M25512 Pain in left shoulder: Secondary | ICD-10-CM

## 2021-06-02 DIAGNOSIS — M6281 Muscle weakness (generalized): Secondary | ICD-10-CM | POA: Diagnosis not present

## 2021-06-02 NOTE — Therapy (Signed)
[facility] [address] [place], [place], [address] Phone: [phone]   Fax:  [phone]  Physical Therapy Treatment  Patient Details  Name: Brittany [name] MRN: [id number] Date of Birth: [birth year] Referring Provider (PT): Jari Sportsman PA-C   Encounter Date: 06/02/2021   PT End of Session - 06/02/21 0808     Visit Number 3    Number of Visits 20    Date for PT Re-Evaluation 07/10/21    Authorization Type BCBS 90 combined    Progress Note Due on Visit 10    PT Start Time 0807    PT Stop Time 0845    PT Time Calculation (min) 38 min    Activity Tolerance Patient tolerated treatment well    Behavior During Therapy [facility] for tasks assessed/performed             Past Medical History:  Diagnosis Date   Anxiety    Depression    Diabetes mellitus without complication (HCC)    HLD (hyperlipidemia) 01/02/2014   Hypertension UNSURE    Past Surgical History:  Procedure Laterality Date   TUBAL LIGATION      There were no vitals filed for this visit.   Subjective Assessment - 06/02/21 0810     Subjective Pt. indicated pain not as bad , 5/10.    Limitations Lifting;House hold activities    Patient Stated Goals Reduce pain    Currently in Pain? Yes    Pain Score 5     Pain Location Shoulder    Pain Orientation Left    Pain Descriptors / Indicators Sharp    Pain Onset More than a month ago    Pain Frequency Intermittent    Aggravating Factors  reaching end ranges, lifting    Pain Relieving Factors treatment and exercises, medicine                               [facility] Adult PT Treatment/Exercise - 06/02/21 0001       Shoulder Exercises: Supine   Horizontal ABduction Both   2 x 10   Theraband Level (Shoulder Horizontal ABduction) Level 3 (Green)    Flexion Left   1 lb 3 x 15   Shoulder Flexion Weight (lbs) 1      Shoulder Exercises: Standing   External Rotation Left   3 x 10 slow eccentric return, towel  at side   Theraband Level (Shoulder External Rotation) Level 3 (Green)    Extension Both   3 x 10   Theraband Level (Shoulder Extension) Level 3 (Green)    Row Both;Theraband   3 x 10   Theraband Level (Shoulder Row) Level 3 (Green)    Other Standing Exercises Green band ER walk out hold 5 sec x 10 Lt shoulder c towel at side      Shoulder Exercises: ROM/Strengthening   UBE (Upper Arm Bike) Lvl 3 3 mins fwd/back each way      Manual Therapy   Manual therapy comments g2 inferior joint mobs Lt GH joint in flexion, scaption.  Mobilization c movement for ER c posterior glide in 75 deg abd                     PT Education - 06/02/21 0842     Education Details HEP progression    Person(s) Educated Patient    Methods Explanation;Demonstration;Verbal cues;Handout    Comprehension Verbalized understanding;Returned demonstration  PT Short Term Goals - 05/28/21 5329       PT SHORT TERM GOAL #1   Title Patient will demonstrate independent use of home exercise program to maintain progress from in clinic treatments.    Time 3    Period Weeks    Status Achieved    Target Date 05/22/21               PT Long Term Goals - 06/02/21 0818       PT LONG TERM GOAL #1   Title Patient will demonstrate/report pain at worst less than or equal to 2/10 to facilitate minimal limitation in daily activity secondary to pain symptoms.    Time 10    Period Weeks    Status On-going    Target Date 07/10/21      PT LONG TERM GOAL #2   Title Patient will demonstrate independent use of home exercise program to facilitate ability to maintain/progress functional gains from skilled physical therapy services.    Time 10    Period Weeks    Status On-going    Target Date 07/10/21      PT LONG TERM GOAL #3   Title Pt. will demonstrate FOTO outcome > or = 64 % to indicated reduced disability due to condition.    Time 10    Period Weeks    Status On-going    Target Date  07/10/21      PT LONG TERM GOAL #4   Title Patient will demonstrate Lt GH joint mobility WFL to facilitate usual self care, dressing, reaching overhead at PLOF s limitation due to symptoms.    Time 10    Period Weeks    Status On-going    Target Date 07/10/21      PT LONG TERM GOAL #5   Title Patient will demonstrate Lt UE MMT 5/5 throughout to facilitate usual lifting, carrying in functional activity to PLOF s limitation.    Time 10    Period Weeks    Status On-going    Target Date 07/10/21                   Plan - 06/02/21 0819     Clinical Impression Statement Minimal joint hypomobility noted in check today.  Mild guarding in ER mobility end range passively.  Continued progression in active mobility/strengthening to improve use of Lt arm in daily activity.  Fair to good tolerance overall noted c no post activity increased symptoms reported.    Personal Factors and Comorbidities Comorbidity 3+    Comorbidities DM, hyperlipidemia, depression    Examination-Activity Limitations Bathing;Sleep;Carry;Toileting;Dressing;Hygiene/Grooming;Lift;Reach Overhead    Examination-Participation Restrictions Cleaning;Community Activity;Driving;Laundry;Meal Prep    Stability/Clinical Decision Making Stable/Uncomplicated    Rehab Potential Good    PT Frequency --   1-2x/week   PT Duration Other (comment)   10 weeks   PT Treatment/Interventions ADLs/Self Care Home Management;Cryotherapy;Electrical Stimulation;Iontophoresis 4mg /ml Dexamethasone;Moist Heat;Traction;Balance training;Therapeutic exercise;Therapeutic activities;Functional mobility training;Stair training;Gait training;Ultrasound;Neuromuscular re-education;Patient/family education;Passive range of motion;Spinal Manipulations;Joint Manipulations;Dry needling;Taping;Manual techniques    PT Next Visit Plan Strengthening progression in elevation, posterior capsule.    PT Home Exercise Plan [email or web address]    Consulted and Agree with Plan of  Care Patient             Patient will benefit from skilled therapeutic intervention in order to improve the following deficits and impairments:  Pain, Impaired UE functional use, Decreased strength, Decreased activity tolerance, Decreased range of motion, Impaired perceived functional ability,  Improper body mechanics, Postural dysfunction, Impaired flexibility, Decreased coordination, Decreased endurance  Visit Diagnosis: Chronic left shoulder pain  Muscle weakness (generalized)  Abnormal posture     Problem List Patient Active Problem List   Diagnosis Date Noted   Class 3 severe obesity due to excess calories without serious comorbidity with body mass index (BMI) of 40.0 to 44.9 in adult [facility]) 09/29/2018   Hives 09/29/2018   Stressful life event affecting family 09/29/2018   PTSD (post-traumatic stress disorder) 01/04/2015   Hepatic steatosis 05/16/2014   Uncontrolled type 2 diabetes mellitus with complication, without long-term current use of insulin (HCC) 05/01/2014   Essential hypertension 01/01/2014   Chyrel Masson, PT, DPT, OCS, ATC 06/02/21  8:44 AM    [facility] [address] [place], [place], [address] Phone: [phone]   Fax:  [phone]  Name: Brittany [name] MRN: [id number] Date of Birth: [birth year]

## 2021-06-02 NOTE — Patient Instructions (Signed)
Access Code: [email or web address] URL: [email or web address] Date: 06/02/2021 Prepared by: Chyrel Masson  Exercises Seated Scapular Retraction - 2 x daily - 7 x weekly - 10 reps - 2 sets - 5 hold Supine Shoulder Flexion Extension Full Range AROM (Mirrored) - 2 x daily - 7 x weekly - 3 sets - 10 reps Standing Shoulder Row with Anchored Resistance - 2 x daily - 7 x weekly - 10 reps - 3 sets Shoulder External Rotation Reactive Isometrics - 2 x daily - 7 x weekly - 1 sets - 15 reps - 5 hold Supine Shoulder Horizontal Abduction with Resistance - 2 x daily - 7 x weekly - 10 reps - 3 sets Shoulder Extension with Resistance - 2 x daily - 7 x weekly - 10 reps - 3 sets

## 2021-06-09 ENCOUNTER — Other Ambulatory Visit: Payer: Self-pay

## 2021-06-09 ENCOUNTER — Ambulatory Visit (INDEPENDENT_AMBULATORY_CARE_PROVIDER_SITE_OTHER): Payer: BC Managed Care – PPO | Admitting: Rehabilitative and Restorative Service Providers"

## 2021-06-09 ENCOUNTER — Encounter: Payer: BC Managed Care – PPO | Admitting: Rehabilitative and Restorative Service Providers"

## 2021-06-09 ENCOUNTER — Encounter: Payer: Self-pay | Admitting: Rehabilitative and Restorative Service Providers"

## 2021-06-09 DIAGNOSIS — G8929 Other chronic pain: Secondary | ICD-10-CM | POA: Diagnosis not present

## 2021-06-09 DIAGNOSIS — R293 Abnormal posture: Secondary | ICD-10-CM

## 2021-06-09 DIAGNOSIS — M6281 Muscle weakness (generalized): Secondary | ICD-10-CM

## 2021-06-09 DIAGNOSIS — M25512 Pain in left shoulder: Secondary | ICD-10-CM

## 2021-06-09 NOTE — Therapy (Signed)
[facility] [address] [place], [place], [address] Phone: [phone]   Fax:  [phone]  Physical Therapy Treatment  Patient Details  Name: Brittany [name] MRN: [id number] Date of Birth: 04/01/1976 Referring Provider (PT): Jari Sportsman PA-C   Encounter Date: 06/09/2021   PT End of Session - 06/09/21 0810     Visit Number 4    Number of Visits 20    Date for PT Re-Evaluation 07/10/21    Authorization Type BCBS 90 combined    Progress Note Due on Visit 10    PT Start Time 0801    PT Stop Time 0840    PT Time Calculation (min) 39 min    Activity Tolerance Patient tolerated treatment well    Behavior During Therapy [facility] for tasks assessed/performed             Past Medical History:  Diagnosis Date   Anxiety    Depression    Diabetes mellitus without complication (HCC)    HLD (hyperlipidemia) 01/02/2014   Hypertension UNSURE    Past Surgical History:  Procedure Laterality Date   TUBAL LIGATION      There were no vitals filed for this visit.   Subjective Assessment - 06/09/21 0810     Subjective Pt. indicated she didn't have much pain today.  Reported some stiffness in morning.  She stated she felt like she is using arm more.    Limitations Lifting;House hold activities    Patient Stated Goals Reduce pain    Currently in Pain? No/denies   no pain at rest upon arrival.   Pain Score 0-No pain    Pain Onset More than a month ago                [facility] PT Assessment - 06/09/21 0001       Assessment   Medical Diagnosis Chronic Lt shoulder pain    Referring Provider (PT) Jari Sportsman PA-C    Onset Date/Surgical Date 03/23/21    Hand Dominance Right      AROM   Left Shoulder Flexion 120 Degrees   in supine   Left Shoulder ABduction 110 Degrees   in supine   Left Shoulder Internal Rotation 80 Degrees   in supine 45 deg abd   Left Shoulder External Rotation 65 Degrees   in supine 45 deg abd                           [facility] Adult PT Treatment/Exercise - 06/09/21 0001       Shoulder Exercises: Sidelying   Flexion Left   2 x 15   Flexion Weight (lbs) 2    ABduction Left   2 x 15   ABduction Weight (lbs) 2      Shoulder Exercises: Standing   External Rotation Left   slow eccentric return 3 x 10 c towel at side   Theraband Level (Shoulder External Rotation) Level 3 (Green)    Internal Rotation Left   c towel at side 3 x 10   Theraband Level (Shoulder Internal Rotation) Level 3 (Green)    Extension Both;20 reps    Theraband Level (Shoulder Extension) Level 3 (Green)    Row Both;20 reps    Theraband Level (Shoulder Row) Level 3 (Green)    Other Standing Exercises standing flexion AAROM 2 lb bar full range available 2 x 10      Shoulder Exercises: Pulleys   Flexion 3  minutes    ABduction 3 minutes      Shoulder Exercises: Stretch   Other Shoulder Stretches ER doorway stretch in 90 deg abd 30 sec x 3                       PT Short Term Goals - 05/28/21 4010       PT SHORT TERM GOAL #1   Title Patient will demonstrate independent use of home exercise program to maintain progress from in clinic treatments.    Time 3    Period Weeks    Status Achieved    Target Date 05/22/21               PT Long Term Goals - 06/02/21 0818       PT LONG TERM GOAL #1   Title Patient will demonstrate/report pain at worst less than or equal to 2/10 to facilitate minimal limitation in daily activity secondary to pain symptoms.    Time 10    Period Weeks    Status On-going    Target Date 07/10/21      PT LONG TERM GOAL #2   Title Patient will demonstrate independent use of home exercise program to facilitate ability to maintain/progress functional gains from skilled physical therapy services.    Time 10    Period Weeks    Status On-going    Target Date 07/10/21      PT LONG TERM GOAL #3   Title Pt. will demonstrate FOTO outcome > or = 64 % to indicated  reduced disability due to condition.    Time 10    Period Weeks    Status On-going    Target Date 07/10/21      PT LONG TERM GOAL #4   Title Patient will demonstrate Lt GH joint mobility WFL to facilitate usual self care, dressing, reaching overhead at PLOF s limitation due to symptoms.    Time 10    Period Weeks    Status On-going    Target Date 07/10/21      PT LONG TERM GOAL #5   Title Patient will demonstrate Lt UE MMT 5/5 throughout to facilitate usual lifting, carrying in functional activity to PLOF s limitation.    Time 10    Period Weeks    Status On-going    Target Date 07/10/21                   Plan - 06/09/21 0825     Clinical Impression Statement Pt. was able to progress into increased progressive resistance in elevation attempts in gravity reduced positioning c good control overall.  Pt. to continue to benefit from transitioning into standing resistance for elevation for functional use.  See objective data for AROM measurement updates for supine AROM    Personal Factors and Comorbidities Comorbidity 3+    Comorbidities DM, hyperlipidemia, depression    Examination-Activity Limitations Bathing;Sleep;Carry;Toileting;Dressing;Hygiene/Grooming;Lift;Reach Overhead    Examination-Participation Restrictions Cleaning;Community Activity;Driving;Laundry;Meal Prep    Stability/Clinical Decision Making Stable/Uncomplicated    Rehab Potential Good    PT Frequency --   1-2x/week   PT Duration Other (comment)   10 weeks   PT Treatment/Interventions ADLs/Self Care Home Management;Cryotherapy;Electrical Stimulation;Iontophoresis 4mg /ml Dexamethasone;Moist Heat;Traction;Balance training;Therapeutic exercise;Therapeutic activities;Functional mobility training;Stair training;Gait training;Ultrasound;Neuromuscular re-education;Patient/family education;Passive range of motion;Spinal Manipulations;Joint Manipulations;Dry needling;Taping;Manual techniques    PT Next Visit Plan  Strengthening progression in elevation as able, HEP transitioning as function improves.    PT Home Exercise  Plan [email or web address]    Consulted and Agree with Plan of Care Patient             Patient will benefit from skilled therapeutic intervention in order to improve the following deficits and impairments:  Pain, Impaired UE functional use, Decreased strength, Decreased activity tolerance, Decreased range of motion, Impaired perceived functional ability, Improper body mechanics, Postural dysfunction, Impaired flexibility, Decreased coordination, Decreased endurance  Visit Diagnosis: Chronic left shoulder pain  Muscle weakness (generalized)  Abnormal posture     Problem List Patient Active Problem List   Diagnosis Date Noted   Class 3 severe obesity due to excess calories without serious comorbidity with body mass index (BMI) of 40.0 to 44.9 in adult [facility]) 09/29/2018   Hives 09/29/2018   Stressful life event affecting family 09/29/2018   PTSD (post-traumatic stress disorder) 01/04/2015   Hepatic steatosis 05/16/2014   Uncontrolled type 2 diabetes mellitus with complication, without long-term current use of insulin (HCC) 05/01/2014   Essential hypertension 01/01/2014   Chyrel Masson, PT, DPT, OCS, ATC 06/09/21  8:38 AM    [facility] [address] [place], [place], [address] Phone: [phone]   Fax:  [phone]  Name: Hideko Esselman MRN: [id number] Date of Birth: [birth year]

## 2021-06-16 ENCOUNTER — Encounter: Payer: BC Managed Care – PPO | Admitting: Rehabilitative and Restorative Service Providers"

## 2021-06-17 ENCOUNTER — Encounter: Payer: Self-pay | Admitting: Physical Therapy

## 2021-06-17 ENCOUNTER — Other Ambulatory Visit: Payer: Self-pay

## 2021-06-17 ENCOUNTER — Ambulatory Visit (INDEPENDENT_AMBULATORY_CARE_PROVIDER_SITE_OTHER): Payer: BC Managed Care – PPO | Admitting: Physical Therapy

## 2021-06-17 DIAGNOSIS — M25512 Pain in left shoulder: Secondary | ICD-10-CM

## 2021-06-17 DIAGNOSIS — G8929 Other chronic pain: Secondary | ICD-10-CM | POA: Diagnosis not present

## 2021-06-17 DIAGNOSIS — M6281 Muscle weakness (generalized): Secondary | ICD-10-CM

## 2021-06-17 DIAGNOSIS — R293 Abnormal posture: Secondary | ICD-10-CM

## 2021-06-17 NOTE — Therapy (Signed)
[facility] [address] [place], [place], [address] Phone: [phone]   Fax:  [phone]  Physical Therapy Treatment  Patient Details  Name: Brittany [name] MRN: [id number] Date of Birth: 01/09/1976 Referring Provider (PT): Jari Sportsman PA-C   Encounter Date: 06/17/2021   PT End of Session - 06/17/21 0845     Visit Number 5    Number of Visits 20    Date for PT Re-Evaluation 07/10/21    Authorization Type BCBS 90 combined    Progress Note Due on Visit 10    PT Start Time 0802    PT Stop Time 0843    PT Time Calculation (min) 41 min    Activity Tolerance Patient tolerated treatment well    Behavior During Therapy [facility] for tasks assessed/performed             Past Medical History:  Diagnosis Date   Anxiety    Depression    Diabetes mellitus without complication (HCC)    HLD (hyperlipidemia) 01/02/2014   Hypertension UNSURE    Past Surgical History:  Procedure Laterality Date   TUBAL LIGATION      There were no vitals filed for this visit.   Subjective Assessment - 06/17/21 0804     Subjective feels like shoulder is slowly getting better; pain is intermittent    Limitations Lifting;House hold activities    Patient Stated Goals Reduce pain    Currently in Pain? Yes    Pain Score 7     Pain Location Shoulder    Pain Orientation Left    Pain Descriptors / Indicators Sharp    Pain Type Chronic pain    Pain Onset More than a month ago    Pain Frequency Intermittent    Aggravating Factors  end ranges, lifting, possible sleeping positions    Pain Relieving Factors treatment, exercises, medication                               [facility] Adult PT Treatment/Exercise - 06/17/21 0806       Shoulder Exercises: Supine   Flexion Left;Weights   3x10   Shoulder Flexion Weight (lbs) 2      Shoulder Exercises: Sidelying   External Rotation Left;Weights   3x10   External Rotation Weight (lbs) 2    Flexion  Left   2 x 15   Flexion Weight (lbs) 2    ABduction Left   2 x 15   ABduction Weight (lbs) 2      Shoulder Exercises: Standing   External Rotation Left   slow eccentric return 3 x 10 c towel at side   Theraband Level (Shoulder External Rotation) Level 3 (Green)    Internal Rotation Left   c towel at side 3 x 10   Theraband Level (Shoulder Internal Rotation) Level 3 (Green)    Flexion Left;Weights   3x10   Shoulder Flexion Weight (lbs) 2    Extension Both;Theraband   3x10   Theraband Level (Shoulder Extension) Level 3 (Green)    Row Both;Theraband   3x10   Theraband Level (Shoulder Row) Level 3 (Green)      Shoulder Exercises: Pulleys   Flexion 3 minutes    ABduction 3 minutes      Shoulder Exercises: ROM/Strengthening   Proximal Shoulder Strengthening, Supine circles CW/CCW x 20 reps  PT Short Term Goals - 05/28/21 5093       PT SHORT TERM GOAL #1   Title Patient will demonstrate independent use of home exercise program to maintain progress from in clinic treatments.    Time 3    Period Weeks    Status Achieved    Target Date 05/22/21               PT Long Term Goals - 06/02/21 0818       PT LONG TERM GOAL #1   Title Patient will demonstrate/report pain at worst less than or equal to 2/10 to facilitate minimal limitation in daily activity secondary to pain symptoms.    Time 10    Period Weeks    Status On-going    Target Date 07/10/21      PT LONG TERM GOAL #2   Title Patient will demonstrate independent use of home exercise program to facilitate ability to maintain/progress functional gains from skilled physical therapy services.    Time 10    Period Weeks    Status On-going    Target Date 07/10/21      PT LONG TERM GOAL #3   Title Pt. will demonstrate FOTO outcome > or = 64 % to indicated reduced disability due to condition.    Time 10    Period Weeks    Status On-going    Target Date 07/10/21      PT LONG TERM  GOAL #4   Title Patient will demonstrate Lt GH joint mobility WFL to facilitate usual self care, dressing, reaching overhead at PLOF s limitation due to symptoms.    Time 10    Period Weeks    Status On-going    Target Date 07/10/21      PT LONG TERM GOAL #5   Title Patient will demonstrate Lt UE MMT 5/5 throughout to facilitate usual lifting, carrying in functional activity to PLOF s limitation.    Time 10    Period Weeks    Status On-going    Target Date 07/10/21                   Plan - 06/17/21 1031     Clinical Impression Statement Pt tolerated session well today without increase in pain.  Able to progress strengthening exercises with min difficulty noted.  Will continue to benefit from PT to maximize function.    Personal Factors and Comorbidities Comorbidity 3+    Comorbidities DM, hyperlipidemia, depression    Examination-Activity Limitations Bathing;Sleep;Carry;Toileting;Dressing;Hygiene/Grooming;Lift;Reach Overhead    Examination-Participation Restrictions Cleaning;Community Activity;Driving;Laundry;Meal Prep    Stability/Clinical Decision Making Stable/Uncomplicated    Rehab Potential Good    PT Frequency --   1-2x/week   PT Duration Other (comment)   10 weeks   PT Treatment/Interventions ADLs/Self Care Home Management;Cryotherapy;Electrical Stimulation;Iontophoresis 4mg /ml Dexamethasone;Moist Heat;Traction;Balance training;Therapeutic exercise;Therapeutic activities;Functional mobility training;Stair training;Gait training;Ultrasound;Neuromuscular re-education;Patient/family education;Passive range of motion;Spinal Manipulations;Joint Manipulations;Dry needling;Taping;Manual techniques    PT Next Visit Plan continue with strength progression, manual/modalities PRN, HEP transitioning as function improves.    PT Home Exercise Plan [email or web address]    Consulted and Agree with Plan of Care Patient             Patient will benefit from skilled therapeutic intervention in  order to improve the following deficits and impairments:  Pain, Impaired UE functional use, Decreased strength, Decreased activity tolerance, Decreased range of motion, Impaired perceived functional ability, Improper body mechanics, Postural dysfunction, Impaired flexibility, Decreased coordination, Decreased endurance  Visit Diagnosis: Chronic left shoulder pain  Muscle weakness (generalized)  Abnormal posture     Problem List Patient Active Problem List   Diagnosis Date Noted   Class 3 severe obesity due to excess calories without serious comorbidity with body mass index (BMI) of 40.0 to 44.9 in adult [facility]) 09/29/2018   Hives 09/29/2018   Stressful life event affecting family 09/29/2018   PTSD (post-traumatic stress disorder) 01/04/2015   Hepatic steatosis 05/16/2014   Uncontrolled type 2 diabetes mellitus with complication, without long-term current use of insulin (HCC) 05/01/2014   Essential hypertension 01/01/2014      Clarita Crane, PT, DPT 06/17/21 10:34 AM     [facility] [address] [place], [place], [address] Phone: [phone]   Fax:  [phone]  Name: Brittany [name] MRN: [id number] Date of Birth: 20-Nov-1975

## 2021-06-23 ENCOUNTER — Encounter: Payer: BC Managed Care – PPO | Admitting: Rehabilitative and Restorative Service Providers"

## 2021-06-24 ENCOUNTER — Telehealth: Payer: Self-pay | Admitting: Rehabilitative and Restorative Service Providers"

## 2021-06-24 ENCOUNTER — Encounter: Payer: BC Managed Care – PPO | Admitting: Rehabilitative and Restorative Service Providers"

## 2021-06-24 NOTE — Telephone Encounter (Signed)
Called and left voice message about missed appointment today.  Left reminder about next visit.  Chyrel Masson, PT, DPT, OCS, ATC 06/24/21  8:19 AM

## 2021-06-30 ENCOUNTER — Encounter: Payer: BC Managed Care – PPO | Admitting: Rehabilitative and Restorative Service Providers"

## 2021-07-01 ENCOUNTER — Encounter: Payer: Self-pay | Admitting: Rehabilitative and Restorative Service Providers"

## 2021-07-01 ENCOUNTER — Ambulatory Visit (INDEPENDENT_AMBULATORY_CARE_PROVIDER_SITE_OTHER): Payer: BC Managed Care – PPO | Admitting: Rehabilitative and Restorative Service Providers"

## 2021-07-01 ENCOUNTER — Other Ambulatory Visit: Payer: Self-pay

## 2021-07-01 DIAGNOSIS — M25512 Pain in left shoulder: Secondary | ICD-10-CM

## 2021-07-01 DIAGNOSIS — M6281 Muscle weakness (generalized): Secondary | ICD-10-CM

## 2021-07-01 DIAGNOSIS — G8929 Other chronic pain: Secondary | ICD-10-CM | POA: Diagnosis not present

## 2021-07-01 DIAGNOSIS — R293 Abnormal posture: Secondary | ICD-10-CM

## 2021-07-01 NOTE — Therapy (Signed)
[facility] Physical Therapy [address] [place], [place], [address] Phone: [phone]   Fax:  [phone]  Physical Therapy Treatment  Patient Details  Name: Brittany [name] MRN: [id number] Date of Birth: 03-28-1976 Referring Provider (PT): Jari Sportsman PA-C   Encounter Date: 07/01/2021   PT End of Session - 07/01/21 0805     Visit Number 6    Number of Visits 20    Date for PT Re-Evaluation 07/10/21    Authorization Type BCBS 90 combined    Progress Note Due on Visit 10    PT Start Time 0800    PT Stop Time 0839    PT Time Calculation (min) 39 min    Activity Tolerance Patient tolerated treatment well    Behavior During Therapy [facility] for tasks assessed/performed             Past Medical History:  Diagnosis Date   Anxiety    Depression    Diabetes mellitus without complication (HCC)    HLD (hyperlipidemia) 01/02/2014   Hypertension UNSURE    Past Surgical History:  Procedure Laterality Date   TUBAL LIGATION      There were no vitals filed for this visit.   Subjective Assessment - 07/01/21 0804     Subjective Pt. indicated no pain upon arrival today.  She indicated getting a pulley for home and thought that was helpful for pain.    Limitations Lifting;House hold activities    Patient Stated Goals Reduce pain    Currently in Pain? No/denies    Pain Score 0-No pain    Pain Onset More than a month ago                [facility] PT Assessment - 07/01/21 0001       Assessment   Medical Diagnosis Chronic Lt shoulder pain    Referring Provider (PT) Jari Sportsman PA-C    Onset Date/Surgical Date 03/23/21    Hand Dominance Right      Observation/Other Assessments   Focus on Therapeutic Outcomes (FOTO)  updated 60%      AROM   Overall AROM Comments sitting AROM Lt: full range flexion no complaints, full range abduction c end range pain noted      Strength   Left Shoulder Flexion 5/5    Left Shoulder ABduction 4/5    Left Shoulder  Internal Rotation 5/5    Left Shoulder External Rotation 4/5                           [facility] Adult PT Treatment/Exercise - 07/01/21 0001       Shoulder Exercises: Seated   Other Seated Exercises seated green band ER c scapular retraction bilateral 3 x 10      Shoulder Exercises: Standing   Internal Rotation Left   c towel at side, 2 x 15   Theraband Level (Shoulder Internal Rotation) Level 3 (Green)    Other Standing Exercises standing red band er c flexion punch 2 x 10, standing 90 deg flexion cw, ccw circles 2 lb ball on wall 30 x 2 each way      Shoulder Exercises: Pulleys   Flexion 3 minutes    ABduction 3 minutes      Shoulder Exercises: ROM/Strengthening   UBE (Upper Arm Bike) Lvl 3 4 mins fwd/back each way  PT Short Term Goals - 05/28/21 8466       PT SHORT TERM GOAL #1   Title Patient will demonstrate independent use of home exercise program to maintain progress from in clinic treatments.    Time 3    Period Weeks    Status Achieved    Target Date 05/22/21               PT Long Term Goals - 07/01/21 0822       PT LONG TERM GOAL #1   Title Patient will demonstrate/report pain at worst less than or equal to 2/10 to facilitate minimal limitation in daily activity secondary to pain symptoms.    Time 10    Period Weeks    Status On-going    Target Date 07/10/21      PT LONG TERM GOAL #2   Title Patient will demonstrate independent use of home exercise program to facilitate ability to maintain/progress functional gains from skilled physical therapy services.    Time 10    Period Weeks    Status On-going    Target Date 07/10/21      PT LONG TERM GOAL #3   Title Pt. will demonstrate FOTO outcome > or = 64 % to indicated reduced disability due to condition.    Time 10    Period Weeks    Status On-going    Target Date 07/10/21      PT LONG TERM GOAL #4   Title Patient will demonstrate Lt GH joint  mobility WFL to facilitate usual self care, dressing, reaching overhead at PLOF s limitation due to symptoms.    Time 10    Period Weeks    Status Achieved      PT LONG TERM GOAL #5   Title Patient will demonstrate Lt UE MMT 5/5 throughout to facilitate usual lifting, carrying in functional activity to PLOF s limitation.    Time 10    Period Weeks    Status On-going    Target Date 07/10/21                   Plan - 07/01/21 0820     Clinical Impression Statement FOTO reassessment showed marked improvement compared to evaluation presentation.  Overall, Pt. has progressed in strengthening interventions and active movement as noted to this point.  Fatigue and muscle weakness still noted overall that can improve functional activity and symptoms if continued improvement noted.    Personal Factors and Comorbidities Comorbidity 3+    Comorbidities DM, hyperlipidemia, depression    Examination-Activity Limitations Bathing;Sleep;Carry;Toileting;Dressing;Hygiene/Grooming;Lift;Reach Overhead    Examination-Participation Restrictions Cleaning;Community Activity;Driving;Laundry;Meal Prep    Stability/Clinical Decision Making Stable/Uncomplicated    Rehab Potential Good    PT Frequency --   1-2x/week   PT Duration Other (comment)   10 weeks   PT Treatment/Interventions ADLs/Self Care Home Management;Cryotherapy;Electrical Stimulation;Iontophoresis 4mg /ml Dexamethasone;Moist Heat;Traction;Balance training;Therapeutic exercise;Therapeutic activities;Functional mobility training;Stair training;Gait training;Ultrasound;Neuromuscular re-education;Patient/family education;Passive range of motion;Spinal Manipulations;Joint Manipulations;Dry needling;Taping;Manual techniques    PT Next Visit Plan Progressive strengthening, functional movements (diagonals, shoulder to overhead).  Possible review for d/c or recertifcation next visit.    PT Home Exercise Plan [email or web address]    Consulted and Agree with Plan of  Care Patient             Patient will benefit from skilled therapeutic intervention in order to improve the following deficits and impairments:  Pain, Impaired UE functional use, Decreased strength, Decreased activity tolerance, Decreased range of motion,  Impaired perceived functional ability, Improper body mechanics, Postural dysfunction, Impaired flexibility, Decreased coordination, Decreased endurance  Visit Diagnosis: Chronic left shoulder pain  Muscle weakness (generalized)  Abnormal posture     Problem List Patient Active Problem List   Diagnosis Date Noted   Class 3 severe obesity due to excess calories without serious comorbidity with body mass index (BMI) of 40.0 to 44.9 in adult [facility]) 09/29/2018   Hives 09/29/2018   Stressful life event affecting family 09/29/2018   PTSD (post-traumatic stress disorder) 01/04/2015   Hepatic steatosis 05/16/2014   Uncontrolled type 2 diabetes mellitus with complication, without long-term current use of insulin 05/01/2014   Essential hypertension 01/01/2014    Chyrel Masson, PT, DPT, OCS, ATC 07/01/21  8:39 AM    [facility] Physical Therapy [address] [place], [place], [address] Phone: [phone]   Fax:  [phone]  Name: Brittany [name] MRN: [id number] Date of Birth: 1976/07/26

## 2021-07-07 ENCOUNTER — Encounter: Payer: BC Managed Care – PPO | Admitting: Rehabilitative and Restorative Service Providers"

## 2021-07-08 ENCOUNTER — Other Ambulatory Visit: Payer: Self-pay

## 2021-07-08 ENCOUNTER — Ambulatory Visit (INDEPENDENT_AMBULATORY_CARE_PROVIDER_SITE_OTHER): Payer: BC Managed Care – PPO | Admitting: Rehabilitative and Restorative Service Providers"

## 2021-07-08 ENCOUNTER — Encounter: Payer: Self-pay | Admitting: Rehabilitative and Restorative Service Providers"

## 2021-07-08 DIAGNOSIS — G8929 Other chronic pain: Secondary | ICD-10-CM | POA: Diagnosis not present

## 2021-07-08 DIAGNOSIS — M6281 Muscle weakness (generalized): Secondary | ICD-10-CM | POA: Diagnosis not present

## 2021-07-08 DIAGNOSIS — R293 Abnormal posture: Secondary | ICD-10-CM | POA: Diagnosis not present

## 2021-07-08 DIAGNOSIS — M25512 Pain in left shoulder: Secondary | ICD-10-CM | POA: Diagnosis not present

## 2021-07-08 NOTE — Therapy (Addendum)
[facility] Physical Therapy [address] [place], [place], [address] Phone: [phone]   Fax:  [phone]  Physical Therapy Treatment/Progress Note/ Discharge   Patient Details  Name: Brittany [name] MRN: [id number] Date of Birth: [birth year] Referring Provider (PT): Dwana Melena PA-C   Encounter Date: 07/08/2021  Progress Note Reporting Period 05/01/2021 to 07/08/2021  See note below for Objective Data and Assessment of Progress/Goals.       PT End of Session - 07/08/21 0802     Visit Number 7    Number of Visits 20    Date for PT Re-Evaluation 07/10/21    Authorization Type BCBS 90 combined    Progress Note Due on Visit 10    PT Start Time 0801    PT Stop Time 0839    PT Time Calculation (min) 38 min    Activity Tolerance Patient tolerated treatment well    Behavior During Therapy WFL for tasks assessed/performed             Past Medical History:  Diagnosis Date   Anxiety    Depression    Diabetes mellitus without complication ([place])    HLD (hyperlipidemia) 01/02/2014   Hypertension UNSURE    Past Surgical History:  Procedure Laterality Date   TUBAL LIGATION      There were no vitals filed for this visit.   Subjective Assessment - 07/08/21 0806     Subjective Pt. reported no complaints of pain since last visit.  Indicated she felt comfortable with trial of HEP at this time.    Limitations Lifting;House hold activities    Patient Stated Goals Reduce pain    Currently in Pain? No/denies    Pain Score 0-No pain    Pain Onset More than a month ago                [facility] PT Assessment - 07/08/21 0001       Assessment   Medical Diagnosis Chronic Lt shoulder pain    Referring Provider (PT) Dwana Melena PA-C    Onset Date/Surgical Date 03/23/21    Hand Dominance Right      Observation/Other Assessments   Focus on Therapeutic Outcomes (FOTO)  update 86%      AROM   Overall AROM Comments WFL at this time, no complaints       Strength   Left Shoulder Flexion 5/5    Left Shoulder ABduction 5/5    Left Shoulder Internal Rotation 5/5    Left Shoulder External Rotation 5/5                           [facility] Adult PT Treatment/Exercise - 07/08/21 0001       Shoulder Exercises: Supine   Horizontal ABduction Both   3 x 10   Theraband Level (Shoulder Horizontal ABduction) Level 4 (Blue)      Shoulder Exercises: Standing   External Rotation Left   3  x10   Theraband Level (Shoulder External Rotation) Level 3 (Green)    Internal Rotation Left   3 x 10   Theraband Level (Shoulder Internal Rotation) Level 3 (Green)    Extension 20 reps;Both    Theraband Level (Shoulder Extension) Level 4 (Blue)    Row 20 reps;Both    Theraband Level (Shoulder Row) Level 4 (Blue)    Other Standing Exercises standing green band ER c flexion punch 2 x 10 Lt    Other Standing Exercises standing wall  push up c serratus press 2 sec hold 20x      Shoulder Exercises: ROM/Strengthening   UBE (Upper Arm Bike) Lvl 3 4 mins fwd/back each way                     PT Education - 07/08/21 0816     Education Details HEP review    Person(s) Educated Patient    Methods Explanation;Demonstration;Handout;Verbal cues    Comprehension Returned demonstration;Verbalized understanding              PT Short Term Goals - 05/28/21 0821       PT SHORT TERM GOAL #1   Title Patient will demonstrate independent use of home exercise program to maintain progress from in clinic treatments.    Time 3    Period Weeks    Status Achieved    Target Date 05/22/21               PT Long Term Goals - 07/08/21 0806       PT LONG TERM GOAL #1   Title Patient will demonstrate/report pain at worst less than or equal to 2/10 to facilitate minimal limitation in daily activity secondary to pain symptoms.    Time 10    Period Weeks    Status Achieved      PT LONG TERM GOAL #2   Title Patient will demonstrate  independent use of home exercise program to facilitate ability to maintain/progress functional gains from skilled physical therapy services.    Time 10    Period Weeks    Status Achieved      PT LONG TERM GOAL #3   Title Pt. will demonstrate FOTO outcome > or = 64 % to indicated reduced disability due to condition.    Time 10    Period Weeks    Status Achieved      PT LONG TERM GOAL #4   Title Patient will demonstrate Lt [place] joint mobility WFL to facilitate usual self care, dressing, reaching overhead at PLOF s limitation due to symptoms.    Time 10    Period Weeks    Status Achieved      PT LONG TERM GOAL #5   Title Patient will demonstrate Lt UE MMT 5/5 throughout to facilitate usual lifting, carrying in functional activity to PLOF s limitation.    Time 10    Period Weeks    Status On-going                   Plan - 07/08/21 0810     Clinical Impression Statement Pt. has attended 7 visits overall during course of treatment, indicated good improvements and reduction of symptoms to this point.  See objective data for updated information  Pt. demonstrated marked improvement in FOTO compared to evaluation and even last assessment approx. 1 week ago.  Due to improvements, Pt. was appropriate for trial HEP at this time.    Personal Factors and Comorbidities Comorbidity 3+    Comorbidities DM, hyperlipidemia, depression    Examination-Activity Limitations Bathing;Sleep;Carry;Toileting;Dressing;Hygiene/Grooming;Lift;Reach Overhead    Examination-Participation Restrictions Cleaning;Community Activity;Driving;Laundry;Meal Prep    Stability/Clinical Decision Making Stable/Uncomplicated    Rehab Potential Good    PT Frequency --   1-2x/week   PT Duration Other (comment)   10 weeks   PT Treatment/Interventions ADLs/Self Care Home Management;Cryotherapy;Electrical Stimulation;Iontophoresis 40m/ml Dexamethasone;Moist Heat;Traction;Balance training;Therapeutic exercise;Therapeutic  activities;Functional mobility training;Stair training;Gait training;Ultrasound;Neuromuscular re-education;Patient/family education;Passive range of motion;Spinal Manipulations;Joint Manipulations;Dry needling;Taping;Manual techniques  PT Next Visit Plan Trial HEP, recert if returning in future.    PT Home Exercise Plan [email or web address]    Consulted and Agree with Plan of Care Patient             Patient will benefit from skilled therapeutic intervention in order to improve the following deficits and impairments:  Pain, Impaired UE functional use, Decreased strength, Decreased activity tolerance, Decreased range of motion, Impaired perceived functional ability, Improper body mechanics, Postural dysfunction, Impaired flexibility, Decreased coordination, Decreased endurance  Visit Diagnosis: Chronic left shoulder pain  Muscle weakness (generalized)  Abnormal posture     Problem List Patient Active Problem List   Diagnosis Date Noted   Class 3 severe obesity due to excess calories without serious comorbidity with body mass index (BMI) of 40.0 to 44.9 in adult [facility]) 09/29/2018   Hives 09/29/2018   Stressful life event affecting family 09/29/2018   PTSD (post-traumatic stress disorder) 01/04/2015   Hepatic steatosis 05/16/2014   Uncontrolled type 2 diabetes mellitus with complication, without long-term current use of insulin 05/01/2014   Essential hypertension 01/01/2014    Scot Jun, PT, DPT, OCS, ATC 07/08/21  8:41 AM  PHYSICAL THERAPY DISCHARGE SUMMARY  Visits from Start of Care: 7  Current functional level related to goals / functional outcomes: See note   Remaining deficits: See note   Education / Equipment: HEP   Patient agrees to discharge. Patient goals were met. Patient is being discharged due to being pleased with the current functional level.  Scot Jun, PT, DPT, OCS, ATC 08/26/21  10:58 AM      [facility] Physical Therapy [address] [place], [place], [address] Phone: [phone]   Fax:  [phone]  Name: Jabree Pernice MRN: [id number] Date of Birth: Nov 24, 1975

## 2021-07-08 NOTE — Patient Instructions (Signed)
Access Code: [email or web address] URL: [email or web address] Date: 07/08/2021 Prepared by: Chyrel Masson  Exercises Seated Scapular Retraction - 2 x daily - 7 x weekly - 2 sets - 10 reps - 5 hold Supine Shoulder Flexion Extension Full Range AROM (Mirrored) - 2 x daily - 7 x weekly - 3 sets - 10 reps Standing Shoulder Row with Anchored Resistance - 2 x daily - 7 x weekly - 3 sets - 10 reps Supine Shoulder Horizontal Abduction with Resistance - 2 x daily - 7 x weekly - 3 sets - 10 reps Shoulder Extension with Resistance - 2 x daily - 7 x weekly - 3 sets - 10 reps Shoulder External Rotation with Anchored Resistance - 2 x daily - 7 x weekly - 10 reps - 3 sets

## 2021-07-14 ENCOUNTER — Encounter: Payer: BC Managed Care – PPO | Admitting: Rehabilitative and Restorative Service Providers"

## 2021-07-15 ENCOUNTER — Encounter: Payer: BC Managed Care – PPO | Admitting: Rehabilitative and Restorative Service Providers"

## 2021-07-21 ENCOUNTER — Encounter: Payer: BC Managed Care – PPO | Admitting: Rehabilitative and Restorative Service Providers"

## 2021-07-28 ENCOUNTER — Encounter: Payer: BC Managed Care – PPO | Admitting: Rehabilitative and Restorative Service Providers"

## 2021-08-04 ENCOUNTER — Encounter: Payer: BC Managed Care – PPO | Admitting: Rehabilitative and Restorative Service Providers"

## 2021-08-11 ENCOUNTER — Encounter: Payer: BC Managed Care – PPO | Admitting: Rehabilitative and Restorative Service Providers"

## 2021-08-26 ENCOUNTER — Encounter: Payer: Self-pay | Admitting: Obstetrics & Gynecology

## 2021-09-01 DIAGNOSIS — Z9851 Tubal ligation status: Secondary | ICD-10-CM | POA: Diagnosis not present

## 2021-09-04 DIAGNOSIS — Z1231 Encounter for screening mammogram for malignant neoplasm of breast: Secondary | ICD-10-CM

## 2021-09-17 ENCOUNTER — Encounter: Payer: Self-pay | Admitting: Internal Medicine

## 2021-09-23 ENCOUNTER — Other Ambulatory Visit (HOSPITAL_BASED_OUTPATIENT_CLINIC_OR_DEPARTMENT_OTHER): Payer: Self-pay | Admitting: Internal Medicine

## 2021-09-23 DIAGNOSIS — Z1231 Encounter for screening mammogram for malignant neoplasm of breast: Secondary | ICD-10-CM

## 2021-09-25 ENCOUNTER — Other Ambulatory Visit: Payer: Self-pay

## 2021-09-25 ENCOUNTER — Ambulatory Visit (INDEPENDENT_AMBULATORY_CARE_PROVIDER_SITE_OTHER): Payer: BC Managed Care – PPO

## 2021-09-25 DIAGNOSIS — Z1231 Encounter for screening mammogram for malignant neoplasm of breast: Secondary | ICD-10-CM | POA: Diagnosis not present

## 2021-11-11 ENCOUNTER — Ambulatory Visit: Payer: BC Managed Care – PPO | Admitting: Obstetrics & Gynecology

## 2021-11-13 ENCOUNTER — Ambulatory Visit: Payer: BC Managed Care – PPO | Admitting: Critical Care Medicine

## 2021-11-13 DIAGNOSIS — Z9851 Tubal ligation status: Secondary | ICD-10-CM | POA: Insufficient documentation

## 2021-11-13 NOTE — Progress Notes (Unsigned)
Established Patient Office Visit  Subjective:  Patient ID: Brittany [name], female    DOB: 1976-09-20  Age: 46 y.o. MRN: [id number]  CC: No chief complaint on file.   HPI Brittany [name] presents for  PCP johnson A1c, eye flu foot colon  Not seen since 2021 Past Medical History:  Diagnosis Date   Anxiety    Depression    Diabetes mellitus without complication ([place])    HLD (hyperlipidemia) 01/02/2014   Hypertension UNSURE    Past Surgical History:  Procedure Laterality Date   TUBAL LIGATION      Family History  Problem Relation Age of Onset   Diabetes Mother    Hyperlipidemia Mother    Diabetes Father    Asthma Son    Diabetes Paternal Grandmother    Diabetes Paternal Grandfather    Kidney disease Paternal Grandfather    Schizophrenia Maternal Grandmother     Social History   Socioeconomic History   Marital status: Single    Spouse name: Not on file   Number of children: Not on file   Years of education: Not on file   Highest education level: Not on file  Occupational History   Not on file  Tobacco Use   Smoking status: Never   Smokeless tobacco: Never  Vaping Use   Vaping Use: Never used  Substance and Sexual Activity   Alcohol use: No   Drug use: No   Sexual activity: Yes    Partners: Male    Birth control/protection: None, Surgical  Other Topics Concern   Not on file  Social History Narrative   Not on file   Social Determinants of Health   Financial Resource Strain: Not on file  Food Insecurity: Not on file  Transportation Needs: Not on file  Physical Activity: Not on file  Stress: Not on file  Social Connections: Not on file  Intimate Partner Violence: Not on file    Outpatient Medications Prior to Visit  Medication Sig Dispense Refill   amLODipine (NORVASC) 5 MG tablet TAKE 1 TABLET BY MOUTH EVERY DAY 90 tablet 0   atorvastatin (LIPITOR) 10 MG tablet TAKE 1 TABLET BY MOUTH EVERY DAY 90 tablet 0    Blood Glucose Monitoring Suppl ([place]) w/Device KIT Use to check blood sugar 3x daily. E11.69 1 kit 0   Boric Acid CRYS Place 600 mg vaginally at bedtime. Use vaginally every night for two weeks then twice a week for six months 500 g 5   fluconazole (DIFLUCAN) 150 MG tablet TAKE 1 TABLET (150 MG TOTAL) BY MOUTH EVERY THREE (3) DAYS AS NEEDED. 3 tablet 3   glucose blood (ONETOUCH VERIO) test strip Use to check blood sugar 3x daily. E11.69 100 each 2   insulin glargine (LANTUS SOLOSTAR) 100 UNIT/ML Solostar Pen Inject 12 Units into the skin daily. 15 mL 5   Insulin Pen Needle (PEN NEEDLES) 31G X 8 MM MISC UAD 100 each 6   Lancets (ONETOUCH DELICA PLUS [id number]) MISC Use to check blood sugar 3x daily. E11.69 100 each 2   Lancets MISC Use as directed.  One touch Verio. 100 each 5   liraglutide (VICTOZA) 18 MG/3ML SOPN Start 0.61m SQ once a day for 7 days, then increase to 1.215monce a day 6 mL 3   lisinopril (ZESTRIL) 5 MG tablet Take 1 tablet (5 mg total) by mouth daily. 90 tablet 3   meloxicam (MOBIC) 7.5 MG tablet Take 1 tablet (7.5 mg total) by  mouth daily. 14 tablet 0   methocarbamol (ROBAXIN) 500 MG tablet Take 1 tablet (500 mg total) by mouth 2 (two) times daily. 20 tablet 0   nystatin-triamcinolone (MYCOLOG II) cream Apply 1 application topically 2 (two) times daily. 60 g 2   traZODone (DESYREL) 50 MG tablet TAKE 1/2 TO 1 TABLET BY MOUTH AT BEDTIME AS NEEDED FOR SLEEP 90 tablet 1   No facility-administered medications prior to visit.    Allergies  Allergen Reactions   Metformin And Related Diarrhea   Zoloft [Sertraline Hcl] Palpitations and Other (See Comments)    Headache    ROS Review of Systems    Objective:    Physical Exam  There were no vitals taken for this visit. Wt Readings from Last 3 Encounters:  07/18/20 213 lb 9.6 oz (96.9 kg)  06/04/20 212 lb 12.8 oz (96.5 kg)  10/13/18 225 lb (102.1 kg)     Health Maintenance Due  Topic  Date Due   COVID-19 Vaccine (3 - Booster for Moderna series) 08/03/2020   COLONOSCOPY (Pts 45-27yr Insurance coverage will need to be confirmed)  Never done   HEMOGLOBIN A1C  01/16/2021   OPHTHALMOLOGY EXAM  04/04/2021   INFLUENZA VACCINE  04/21/2021   FOOT EXAM  07/18/2021    There are no preventive care reminders to display for this patient.  Lab Results  Component Value Date   TSH 1.50 11/07/2015   Lab Results  Component Value Date   WBC 7.8 07/18/2020   HGB 13.0 07/18/2020   HCT 44.4 07/18/2020   MCV 75 (L) 07/18/2020   PLT 309 07/18/2020   Lab Results  Component Value Date   NA 138 07/18/2020   K 4.1 07/18/2020   CO2 25 07/18/2020   GLUCOSE 339 (H) 07/18/2020   BUN 9 07/18/2020   CREATININE 0.68 07/18/2020   BILITOT 0.3 07/18/2020   ALKPHOS 99 07/18/2020   AST 28 07/18/2020   ALT 60 (H) 07/18/2020   PROT 7.1 07/18/2020   ALBUMIN 4.4 07/18/2020   CALCIUM 9.5 07/18/2020   GFR 120.30 01/10/2014   Lab Results  Component Value Date   CHOL 246 (H) 07/18/2020   Lab Results  Component Value Date   HDL 61 07/18/2020   Lab Results  Component Value Date   LDLCALC 163 (H) 07/18/2020   Lab Results  Component Value Date   TRIG 124 07/18/2020   Lab Results  Component Value Date   CHOLHDL 4.0 07/18/2020   Lab Results  Component Value Date   HGBA1C 13.9 (A) 07/18/2020      Assessment & Plan:   Problem List Items Addressed This Visit   None   No orders of the defined types were placed in this encounter.   Follow-up: No follow-ups on file.    PAsencion Noble MD

## 2022-06-22 ENCOUNTER — Telehealth: Payer: Self-pay

## 2022-06-22 NOTE — Telephone Encounter (Signed)
Received fax from after hours nurse line requesting to schedule patient for appt.  Attempted to contact pt, no answer. Left voicemail stating to call our office if pt still needs an appt.

## 2022-09-17 ENCOUNTER — Other Ambulatory Visit: Payer: Self-pay | Admitting: Internal Medicine

## 2022-09-17 DIAGNOSIS — Z1231 Encounter for screening mammogram for malignant neoplasm of breast: Secondary | ICD-10-CM

## 2022-10-12 ENCOUNTER — Telehealth: Payer: Self-pay

## 2022-10-12 NOTE — Telephone Encounter (Signed)
Attempted to reach patient regarding message left with answering service on 10/12/22 . Left voicemail for patient to call office or send mychart message with details regarding appointment needs.

## 2022-10-21 ENCOUNTER — Ambulatory Visit (INDEPENDENT_AMBULATORY_CARE_PROVIDER_SITE_OTHER): Payer: BC Managed Care – PPO

## 2022-10-21 DIAGNOSIS — Z1231 Encounter for screening mammogram for malignant neoplasm of breast: Secondary | ICD-10-CM | POA: Diagnosis not present

## 2022-11-17 ENCOUNTER — Other Ambulatory Visit (HOSPITAL_COMMUNITY)
Admission: RE | Admit: 2022-11-17 | Discharge: 2022-11-17 | Disposition: A | Discharge status: Home / Self Care | Payer: BC Managed Care – PPO | Source: Ambulatory Visit | Attending: Obstetrics & Gynecology | Admitting: Obstetrics & Gynecology

## 2022-11-17 ENCOUNTER — Encounter: Payer: Self-pay | Admitting: Obstetrics & Gynecology

## 2022-11-17 ENCOUNTER — Ambulatory Visit (INDEPENDENT_AMBULATORY_CARE_PROVIDER_SITE_OTHER): Payer: BC Managed Care – PPO | Admitting: Obstetrics & Gynecology

## 2022-11-17 VITALS — BP 162/98 | HR 82 | Wt 206.0 lb

## 2022-11-17 DIAGNOSIS — Z113 Encounter for screening for infections with a predominantly sexual mode of transmission: Secondary | ICD-10-CM | POA: Diagnosis not present

## 2022-11-17 DIAGNOSIS — Z01419 Encounter for gynecological examination (general) (routine) without abnormal findings: Secondary | ICD-10-CM | POA: Diagnosis not present

## 2022-11-17 DIAGNOSIS — B379 Candidiasis, unspecified: Secondary | ICD-10-CM

## 2022-11-17 DIAGNOSIS — B3731 Acute candidiasis of vulva and vagina: Secondary | ICD-10-CM

## 2022-11-17 DIAGNOSIS — G4700 Insomnia, unspecified: Secondary | ICD-10-CM

## 2022-11-17 DIAGNOSIS — E669 Obesity, unspecified: Secondary | ICD-10-CM | POA: Diagnosis not present

## 2022-11-17 DIAGNOSIS — Z1211 Encounter for screening for malignant neoplasm of colon: Secondary | ICD-10-CM

## 2022-11-17 DIAGNOSIS — N924 Excessive bleeding in the premenopausal period: Secondary | ICD-10-CM | POA: Diagnosis not present

## 2022-11-17 DIAGNOSIS — I10 Essential (primary) hypertension: Secondary | ICD-10-CM | POA: Diagnosis not present

## 2022-11-17 DIAGNOSIS — E1169 Type 2 diabetes mellitus with other specified complication: Secondary | ICD-10-CM | POA: Diagnosis not present

## 2022-11-17 DIAGNOSIS — N760 Acute vaginitis: Secondary | ICD-10-CM

## 2022-11-17 DIAGNOSIS — B9689 Other specified bacterial agents as the cause of diseases classified elsewhere: Secondary | ICD-10-CM

## 2022-11-17 MED ORDER — AMLODIPINE BESYLATE 10 MG PO TABS: 10.0000 mg | ORAL_TABLET | Freq: Every day | ORAL | 2 refills | Status: DC

## 2022-11-17 NOTE — Progress Notes (Signed)
GYNECOLOGY ANNUAL PREVENTATIVE CARE ENCOUNTER NOTE  History:     Brittany [name] is a 47 y.o. [id number] female here for a routine annual gynecologic exam.  Current complaints: patient is concerned about perimenopause. She notes that her cycle came twice on 09/26/22 and 10/21/22, only once thus far in February. Also reported occasional hot flashes and insomnia. She is very worried and wants evaluation for this.  Also wants full STI screen and testing for vaginitis.  Desires healthcare maintenance labs, has not seen PCP in a  while and not taking her HTN medications. Has history of T2DM, needs A1C checked. Denies current abnormal vaginal bleeding, discharge, pelvic pain, problems with intercourse or other gynecologic concerns.    Gynecologic History Patient's last menstrual period was 09/26/2022. Contraception: tubal ligation Last Pap: 06/04/2020. Result was normal with negative HPV Last Mammogram: 10/21/2022.  Result was normal Last Colonoscopy: Never had screening.  Obstetric History OB History  Gravida Para Term Preterm AB Living  '2 2 2     2  '$ SAB IAB Ectopic Multiple Live Births          2    # Outcome Date GA Lbr Len/2nd Weight Sex Delivery Anes PTL Lv  2 Term 12/30/00    M Vag-Spont   LIV  1 Term 12/17/95    [address]   LIV    Past Medical History:  Diagnosis Date   Anxiety    Depression    Diabetes mellitus without complication ([place])    HLD (hyperlipidemia) 01/02/2014   Hypertension UNSURE    Past Surgical History:  Procedure Laterality Date   TUBAL LIGATION      Current Outpatient Medications on File Prior to Visit  Medication Sig Dispense Refill   Blood Glucose Monitoring Suppl ([place]) w/Device KIT Use to check blood sugar 3x daily. E11.69 1 kit 0   glucose blood (ONETOUCH VERIO) test strip Use to check blood sugar 3x daily. E11.69 100 each 2   insulin glargine (LANTUS SOLOSTAR) 100 UNIT/ML Solostar Pen Inject 12 Units into the skin daily. 15 mL  5   Insulin Pen Needle (PEN NEEDLES) 31G X 8 MM MISC UAD 100 each 6   Lancets (ONETOUCH DELICA PLUS Q000111Q) MISC Use to check blood sugar 3x daily. E11.69 100 each 2   Lancets MISC Use as directed.  One touch Verio. 100 each 5   atorvastatin (LIPITOR) 10 MG tablet TAKE 1 TABLET BY MOUTH EVERY DAY (Patient not taking: Reported on 11/17/2022) 90 tablet 0   Boric Acid CRYS Place 600 mg vaginally at bedtime. Use vaginally every night for two weeks then twice a week for six months (Patient not taking: Reported on 11/17/2022) 500 g 5   fluconazole (DIFLUCAN) 150 MG tablet TAKE 1 TABLET (150 MG TOTAL) BY MOUTH EVERY THREE (3) DAYS AS NEEDED. (Patient not taking: Reported on 11/17/2022) 3 tablet 3   liraglutide (VICTOZA) 18 MG/3ML SOPN Start 0.'6mg'$  SQ once a day for 7 days, then increase to 1.'2mg'$  once a day (Patient not taking: Reported on 11/17/2022) 6 mL 3   lisinopril (ZESTRIL) 5 MG tablet Take 1 tablet (5 mg total) by mouth daily. (Patient not taking: Reported on 11/17/2022) 90 tablet 3   meloxicam (MOBIC) 7.5 MG tablet Take 1 tablet (7.5 mg total) by mouth daily. (Patient not taking: Reported on 11/17/2022) 14 tablet 0   methocarbamol (ROBAXIN) 500 MG tablet Take 1 tablet (500 mg total) by mouth 2 (two) times daily. (Patient  not taking: Reported on 11/17/2022) 20 tablet 0   nystatin-triamcinolone (MYCOLOG II) cream Apply 1 application topically 2 (two) times daily. (Patient not taking: Reported on 11/17/2022) 60 g 2   traZODone (DESYREL) 50 MG tablet TAKE 1/2 TO 1 TABLET BY MOUTH AT BEDTIME AS NEEDED FOR SLEEP (Patient not taking: Reported on 11/17/2022) 90 tablet 1   No current facility-administered medications on file prior to visit.    Allergies  Allergen Reactions   Metformin And Related Diarrhea   Zoloft [Sertraline Hcl] Palpitations and Other (See Comments)    Headache    Social History:  reports that she has never smoked. She has never used smokeless tobacco. She reports that she does not drink  alcohol and does not use drugs.  Family History  Problem Relation Age of Onset   Diabetes Mother    Hyperlipidemia Mother    Diabetes Father    Asthma Son    Diabetes Paternal Grandmother    Diabetes Paternal Grandfather    Kidney disease Paternal Grandfather    Schizophrenia Maternal Grandmother     The following portions of the patient's history were reviewed and updated as appropriate: allergies, current medications, past family history, past medical history, past social history, past surgical history and problem list.  Review of Systems Pertinent items noted in HPI and remainder of comprehensive ROS otherwise negative.  Physical Exam:   Today's Vitals   11/17/22 1059 11/17/22 1145  BP: (!) 173/101 (!) 162/98  Pulse: 82   Weight: 206 lb (93.4 kg)    Body mass index is 36.49 kg/m.  CONSTITUTIONAL: Well-developed, well-nourished female in no acute distress.  HENT:  Normocephalic, atraumatic, External right and left ear normal.  EYES: Conjunctivae and EOM are normal. Pupils are equal, round, and reactive to light. No scleral icterus.  NECK: Normal range of motion, supple, no masses.  Normal thyroid.  SKIN: Skin is warm and dry. No rash noted. Not diaphoretic. No erythema. No pallor. MUSCULOSKELETAL: Normal range of motion. No tenderness.  No cyanosis, clubbing, or edema.   NEUROLOGIC: Alert and oriented to person, place, and time. Normal reflexes, muscle tone coordination. No cranial nerve deficit noted. PSYCHIATRIC: Normal mood and affect. Normal behavior. Normal judgment and thought content. CARDIOVASCULAR: Normal heart rate noted, regular rhythm RESPIRATORY: Clear to auscultation bilaterally. Effort and breath sounds normal, no problems with respiration noted. BREASTS: Symmetric in size. No masses, tenderness, skin changes, nipple drainage, or lymphadenopathy bilaterally.  Performed in the presence of a chaperone. ABDOMEN: Soft, obese, no distention appreciated.  No  tenderness, rebound or guarding.  PELVIC: Normal appearing external genitalia and urethral meatus; normal appearing vaginal mucosa and cervix.  White discharge seen, testing sample obtained.  Pap smear obtained.  Unable to palpate uterus or adnexa secondary to habitus.  Performed in the presence of a chaperone.   Assessment and Plan:     1. Abnormal perimenopausal bleeding Could be just perimenopause, but will do other evaluation. Patient desires ultrasound, will follow up all results and manage accordingly. - CBC - TSH Rfx on Abnormal to Free T4 - Cervicovaginal ancillary only - US PELVIC COMPLETE WITH TRANSVAGINAL; Future  2. Routine screening for STI (sexually transmitted infection) STI screen done, will follow up results and manage accordingly. - RPR+HBsAg+HCVAb+HIV - Cervicovaginal ancillary only  3. Essential hypertension Amlodipine restarted after discussion with patient, advised to follow up with PCP. - amLODipine (NORVASC) 10 MG tablet; Take 1 tablet (10 mg total) by mouth daily.  Dispense: 30 tablet; Refill:  2  4. Type 2 diabetes mellitus with obesity (HCC) A1C checked today. She will follow up with PCP. - Hemoglobin A1c  5. Colon cancer screening Discussed need for colon cancer screening over the age 9. Colonoscopy recommended as this is the gold standard.  Patient is unsure about colonoscopy, so discussed Cologuard.   Emphasized that positive Cologuard tests will need to be follow up with diagnostic colonoscopy. Cologuard ordered. Gave information about colon cancer screening modalities. - Cologuard  6. Well woman exam with routine gynecological exam - Cytology - PAP - Comprehensive metabolic panel - Lipid panel - Hemoglobin A1c - TSH Rfx on Abnormal to Free T4 Will follow up results of pap smear and labs and manage accordingly. Mammogram is up to date. Routine preventative health maintenance measures emphasized. Please refer to After Visit Summary for other  counseling recommendations.      Verita Schneiders, MD, [place] for [employer], [place]

## 2022-11-18 LAB — TSH RFX ON ABNORMAL TO FREE T4: TSH: 1.72 u[IU]/mL (ref 0.450–4.500)

## 2022-11-18 LAB — COMPREHENSIVE METABOLIC PANEL
ALT: 28 IU/L (ref 0–32)
AST: 16 IU/L (ref 0–40)
Albumin/Globulin Ratio: 1.5 (ref 1.2–2.2)
Albumin: 4.3 g/dL (ref 3.9–4.9)
Alkaline Phosphatase: 85 IU/L (ref 44–121)
BUN/Creatinine Ratio: 14 (ref 9–23)
BUN: 11 mg/dL (ref 6–24)
Bilirubin Total: 0.3 mg/dL (ref 0.0–1.2)
CO2: 21 mmol/L (ref 20–29)
Calcium: 9.3 mg/dL (ref 8.7–10.2)
Chloride: 103 mmol/L (ref 96–106)
Creatinine, Ser: 0.77 mg/dL (ref 0.57–1.00)
Globulin, Total: 2.9 g/dL (ref 1.5–4.5)
Glucose: 336 mg/dL — ABNORMAL HIGH (ref 70–99)
Potassium: 4.2 mmol/L (ref 3.5–5.2)
Sodium: 139 mmol/L (ref 134–144)
Total Protein: 7.2 g/dL (ref 6.0–8.5)
eGFR: 96 mL/min/{1.73_m2} (ref >59)

## 2022-11-18 LAB — CBC
Hematocrit: 46.3 % (ref 34.0–46.6)
Hemoglobin: 13.6 g/dL (ref 11.1–15.9)
MCH: 22 pg — ABNORMAL LOW (ref 26.6–33.0)
MCHC: 29.4 g/dL — ABNORMAL LOW (ref 31.5–35.7)
MCV: 75 fL — ABNORMAL LOW (ref 79–97)
Platelets: 355 10*3/uL (ref 150–450)
RBC: 6.18 x10E6/uL — ABNORMAL HIGH (ref 3.77–5.28)
RDW: 15.6 % — ABNORMAL HIGH (ref 11.7–15.4)
WBC: 8.2 10*3/uL (ref 3.4–10.8)

## 2022-11-18 LAB — CERVICOVAGINAL ANCILLARY ONLY
Bacterial Vaginitis (gardnerella): POSITIVE — AB
Candida Glabrata: POSITIVE — AB
Candida Vaginitis: NEGATIVE
Chlamydia: NEGATIVE
Comment: NEGATIVE
Comment: NEGATIVE
Comment: NEGATIVE
Comment: NEGATIVE
Comment: NEGATIVE
Comment: NORMAL
Neisseria Gonorrhea: NEGATIVE
Trichomonas: NEGATIVE

## 2022-11-18 LAB — LIPID PANEL
Chol/HDL Ratio: 3.5 ratio (ref 0.0–4.4)
Cholesterol, Total: 230 mg/dL — ABNORMAL HIGH (ref 100–199)
HDL: 66 mg/dL (ref >39)
LDL Chol Calc (NIH): 142 mg/dL — ABNORMAL HIGH (ref 0–99)
Triglycerides: 127 mg/dL (ref 0–149)
VLDL Cholesterol Cal: 22 mg/dL (ref 5–40)

## 2022-11-18 LAB — RPR+HBSAG+HCVAB+...
HIV Screen 4th Generation wRfx: NONREACTIVE
Hep C Virus Ab: NONREACTIVE
Hepatitis B Surface Ag: NEGATIVE
RPR Ser Ql: NONREACTIVE

## 2022-11-18 LAB — HEMOGLOBIN A1C
Est. average glucose Bld gHb Est-mCnc: 312 mg/dL
Hgb A1c MFr Bld: 12.5 % — ABNORMAL HIGH (ref 4.8–5.6)

## 2022-11-18 MED ORDER — FLUCONAZOLE 150 MG PO TABS: 150.0000 mg | ORAL_TABLET | ORAL | 3 refills | Status: DC

## 2022-11-18 MED ORDER — BORIC ACID CRYS: 600.0000 mg | CRYSTALS | Freq: Every day | 2 refills | Status: AC

## 2022-11-18 MED ORDER — METRONIDAZOLE 500 MG PO TABS: 500.0000 mg | ORAL_TABLET | Freq: Two times a day (BID) | ORAL | 0 refills | Status: AC

## 2022-11-18 MED ORDER — TRAZODONE HCL 50 MG PO TABS: 25.0000 mg | ORAL_TABLET | Freq: Every evening | ORAL | 1 refills | Status: DC | PRN

## 2022-11-18 NOTE — Addendum Note (Signed)
Addended by: Verita Schneiders A on: 11/18/2022 01:16 PM   Modules accepted: Orders

## 2022-11-19 LAB — CYTOLOGY - PAP
Comment: NEGATIVE
Diagnosis: NEGATIVE
High risk HPV: NEGATIVE

## 2022-12-02 ENCOUNTER — Ambulatory Visit (HOSPITAL_COMMUNITY): Payer: BC Managed Care – PPO

## 2022-12-04 ENCOUNTER — Encounter (HOSPITAL_COMMUNITY): Payer: Self-pay

## 2022-12-04 ENCOUNTER — Ambulatory Visit (HOSPITAL_COMMUNITY): Payer: BC Managed Care – PPO

## 2023-01-27 ENCOUNTER — Encounter: Payer: Self-pay | Admitting: Obstetrics & Gynecology

## 2023-01-27 DIAGNOSIS — I10 Essential (primary) hypertension: Secondary | ICD-10-CM

## 2023-01-28 ENCOUNTER — Other Ambulatory Visit: Payer: Self-pay | Admitting: Obstetrics & Gynecology

## 2023-01-28 DIAGNOSIS — I10 Essential (primary) hypertension: Secondary | ICD-10-CM

## 2023-01-28 MED ORDER — LISINOPRIL 5 MG PO TABS: 5.0000 mg | ORAL_TABLET | Freq: Every day | ORAL | 1 refills | Status: DC

## 2023-01-28 MED ORDER — AMLODIPINE BESYLATE 10 MG PO TABS: 10.0000 mg | ORAL_TABLET | Freq: Every day | ORAL | 1 refills | Status: DC

## 2023-02-04 ENCOUNTER — Encounter: Payer: Self-pay | Admitting: Obstetrics & Gynecology

## 2023-02-05 ENCOUNTER — Other Ambulatory Visit: Payer: Self-pay | Admitting: *Deleted

## 2023-02-05 DIAGNOSIS — Z1211 Encounter for screening for malignant neoplasm of colon: Secondary | ICD-10-CM

## 2023-02-22 ENCOUNTER — Encounter: Payer: Self-pay | Admitting: Internal Medicine

## 2023-03-02 ENCOUNTER — Ambulatory Visit (AMBULATORY_SURGERY_CENTER): Payer: BC Managed Care – PPO | Admitting: *Deleted

## 2023-03-02 VITALS — Ht 63.0 in | Wt 201.0 lb

## 2023-03-02 DIAGNOSIS — Z1211 Encounter for screening for malignant neoplasm of colon: Secondary | ICD-10-CM

## 2023-03-02 MED ORDER — NA SULFATE-K SULFATE-MG SULF 17.5-3.13-1.6 GM/177ML PO SOLN
1.0000 | Freq: Once | ORAL | 0 refills | Status: AC
Start: 2023-03-02 — End: 2023-03-02

## 2023-03-02 NOTE — Progress Notes (Signed)
Pt's name and DOB verified at the beginning of the pre-visit.  Pt denies any difficulty with ambulating,sitting, laying down or rolling side to side Gave both [facility] main # and MD on call # prior to instructions.  No egg or soy allergy known to patient  No issues known to pt with past sedation with any surgeries or procedures Pt denies having issues being intubated Pt has no issues moving head neck or swallowing No FH of Malignant Hyperthermia Pt is not on diet pills Pt is not on home 02  Pt is not on blood thinners  Pt denies issues with constipation  Pt is not on dialysis Pt denise any abnormal heart rhythms  Pt denies any upcoming cardiac testing Pt encouraged to use to use Singlecare or Goodrx to reduce cost  Patient's chart reviewed by Cathlyn Parsons CNRA prior to pre-visit and patient appropriate for the [facility].  Pre-visit completed and red dot placed by patient's name on their procedure day (on provider's schedule).  . Visit by phone Pt states weight is 201 lb Instructed pt why it is important to and  to call if they have any changes in health or new medications. Directed them to the # given and on instructions.   Pt states they will.  Instructions reviewed with pt and pt states understanding. Instructed to review again prior to procedure. Pt states they will.  Instructions sent by mail with coupon and by my chart

## 2023-03-05 ENCOUNTER — Encounter: Payer: Self-pay | Admitting: Internal Medicine

## 2023-03-15 ENCOUNTER — Encounter: Payer: Self-pay | Admitting: Internal Medicine

## 2023-03-19 ENCOUNTER — Telehealth: Payer: Self-pay | Admitting: Internal Medicine

## 2023-03-19 LAB — HM DIABETES EYE EXAM

## 2023-03-19 NOTE — Telephone Encounter (Signed)
Patient rescheduled for procedure. Please advise on prep instructions.

## 2023-03-22 NOTE — Telephone Encounter (Signed)
New prep instructions sent via mychart. Hard copy printed to be mailed out to home address on file.

## 2023-03-23 ENCOUNTER — Encounter: Payer: BC Managed Care – PPO | Admitting: Internal Medicine

## 2023-03-23 ENCOUNTER — Encounter: Payer: Self-pay | Admitting: Internal Medicine

## 2023-03-23 ENCOUNTER — Ambulatory Visit: Payer: BC Managed Care – PPO | Attending: Internal Medicine | Admitting: Internal Medicine

## 2023-03-23 VITALS — BP 158/87 | HR 78 | Temp 98.0°F | Ht 63.0 in | Wt 208.0 lb

## 2023-03-23 DIAGNOSIS — E1159 Type 2 diabetes mellitus with other circulatory complications: Secondary | ICD-10-CM

## 2023-03-23 DIAGNOSIS — Z6379 Other stressful life events affecting family and household: Secondary | ICD-10-CM

## 2023-03-23 DIAGNOSIS — Z794 Long term (current) use of insulin: Secondary | ICD-10-CM | POA: Diagnosis not present

## 2023-03-23 DIAGNOSIS — E1169 Type 2 diabetes mellitus with other specified complication: Secondary | ICD-10-CM

## 2023-03-23 DIAGNOSIS — I152 Hypertension secondary to endocrine disorders: Secondary | ICD-10-CM | POA: Diagnosis not present

## 2023-03-23 DIAGNOSIS — Z7985 Long-term (current) use of injectable non-insulin antidiabetic drugs: Secondary | ICD-10-CM | POA: Diagnosis not present

## 2023-03-23 DIAGNOSIS — Z6836 Body mass index (BMI) 36.0-36.9, adult: Secondary | ICD-10-CM

## 2023-03-23 DIAGNOSIS — E669 Obesity, unspecified: Secondary | ICD-10-CM | POA: Diagnosis not present

## 2023-03-23 DIAGNOSIS — E785 Hyperlipidemia, unspecified: Secondary | ICD-10-CM | POA: Diagnosis not present

## 2023-03-23 DIAGNOSIS — Z1211 Encounter for screening for malignant neoplasm of colon: Secondary | ICD-10-CM

## 2023-03-23 LAB — GLUCOSE, POCT (MANUAL RESULT ENTRY): POC Glucose: 303 mg/dl — AB (ref 70–99)

## 2023-03-23 LAB — POCT GLYCOSYLATED HEMOGLOBIN (HGB A1C): HbA1c, POC (controlled diabetic range): 13.8 % — AB (ref 0.0–7.0)

## 2023-03-23 MED ORDER — ATORVASTATIN CALCIUM 10 MG PO TABS: 10.0000 mg | ORAL_TABLET | Freq: Every day | ORAL | 1 refills | Status: DC

## 2023-03-23 MED ORDER — LANTUS SOLOSTAR 100 UNIT/ML ~~LOC~~ SOPN
14.0000 [IU] | PEN_INJECTOR | Freq: Every day | SUBCUTANEOUS | 5 refills | Status: AC
Start: 2023-03-23 — End: ?

## 2023-03-23 MED ORDER — FREESTYLE LIBRE 3 SENSOR MISC
6 refills | Status: AC
Start: 2023-03-23 — End: ?

## 2023-03-23 MED ORDER — TRULICITY 0.75 MG/0.5ML ~~LOC~~ SOPN
0.7500 mg | PEN_INJECTOR | SUBCUTANEOUS | 4 refills | Status: AC
Start: 2023-03-23 — End: ?

## 2023-03-23 MED ORDER — FREESTYLE LIBRE 3 READER DEVI
1.0000 | Freq: Every day | 0 refills | Status: AC
Start: 2023-03-23 — End: ?

## 2023-03-23 MED ORDER — PEN NEEDLES 31G X 8 MM MISC
6 refills | Status: AC
Start: 2023-03-23 — End: ?

## 2023-03-23 MED ORDER — AMLODIPINE BESYLATE 10 MG PO TABS
10.0000 mg | ORAL_TABLET | Freq: Every day | ORAL | 1 refills | Status: DC
Start: 2023-03-23 — End: 2023-10-13

## 2023-03-23 MED ORDER — VALSARTAN 40 MG PO TABS
40.0000 mg | ORAL_TABLET | Freq: Every day | ORAL | 3 refills | Status: AC
Start: 2023-03-23 — End: ?

## 2023-03-23 NOTE — Progress Notes (Signed)
Patient ID: Brittany [name], female    DOB: 30-Aug-1976  MRN: [id number]  CC: Establish Care (Re-establishing care. Med refills. /Reports not taking any meds since 2023 - only amlodipine, due to caring for mother/Requesting referral to [facility])   Subjective: [name] is a 47 y.o. female who presents for reestablishing diabetes management  Her concerns today include: Patient with history of HTN, DM, obesity, depression, PTSD and HLD   T2DM  Not taking any meds. Previously on Lantus 12 units and Trulicity. Out of both for some time. Tolerated Trulicity well in the past. Agrees to restarting meds. Taking care of mom with dementia which has caused her to neglect health, no help from other family. Checks BS periodically in morning, running usually in high 100s to 200s. Needs refill. No UTIs, numbness or tingling, blurred vision, lightheadedness.  Had recent eye exam at [facility] on June 28th. No reported retinopathy.   No weight changes, has been walking 30 mins a day. Eats 1-2x a day. Limits salt in food,  states she does not eat much, eats once or twice a month.  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   HTN Takes Amlodipine 10 mg daily. Stopped Lisinopril afraid of possible side effect of angioedema. Checks BP once a day before work. Ranges 130-140/85-90. Has headaches occasionally but attributes to eye strain. No chest pain, SOB, leg swelling.   HLD Not taking atorvastatin since last year, willing to get back on it.    4. Depression  Patient wants referral to therapist, will call back with name of therapist.  5. HM Had to reschedule colonoscopy because she had no one to accompany her to it, new appt August 15    Patient Active Problem List   Diagnosis Date Noted   History of tubal ligation 11/13/2021   Class 3 severe obesity due to excess calories without serious comorbidity with body mass index (BMI) of 40.0 to 44.9 in adult [facility]) 09/29/2018   Hives  09/29/2018   Stressful life event affecting family 09/29/2018   PTSD (post-traumatic stress disorder) 01/04/2015   Hepatic steatosis 05/16/2014   Uncontrolled type 2 diabetes mellitus with complication, without long-term current use of insulin 05/01/2014   Essential hypertension 01/01/2014     Current Outpatient Medications on File Prior to Visit  Medication Sig Dispense Refill   Blood Glucose Monitoring Suppl (ONETOUCH VERIO FLEX SYSTEM) w/Device KIT Use to check blood sugar 3x daily. E11.69 (Patient not taking: Reported on 03/23/2023) 1 kit 0   glucose blood (ONETOUCH VERIO) test strip Use to check blood sugar 3x daily. E11.69 (Patient not taking: Reported on 03/23/2023) 100 each 2   Lancets (ONETOUCH DELICA PLUS LANCET30G) MISC Use to check blood sugar 3x daily. E11.69 (Patient not taking: Reported on 03/23/2023) 100 each 2   Lancets MISC Use as directed.  One touch Verio. (Patient not taking: Reported on 03/23/2023) 100 each 5   No current facility-administered medications on file prior to visit.    Allergies  Allergen Reactions   Metformin And Related Diarrhea   Zoloft [Sertraline Hcl] Palpitations and Other (See Comments)    Headache    Social History   Socioeconomic History   Marital status: Single    Spouse name: Not on file   Number of children: Not on file   Years of education: Not on file   Highest education level: Not on file  Occupational History   Not on file  Tobacco Use  Smoking status: Never   Smokeless tobacco: Never  Vaping Use   Vaping Use: Never used  Substance and Sexual Activity   Alcohol use: No   Drug use: No   Sexual activity: Yes    Partners: Male    Birth control/protection: None, Surgical  Other Topics Concern   Not on file  Social History Narrative   Not on file   Social Determinants of Health   Financial Resource Strain: Not on file  Food Insecurity: Not on file  Transportation Needs: Not on file  Physical Activity: Not on file  Stress:  Not on file  Social Connections: Not on file  Intimate Partner Violence: Not on file    Family History  Problem Relation Age of Onset   Diabetes Mother    Hyperlipidemia Mother    Diabetes Father    Schizophrenia Maternal Grandmother    Diabetes Paternal Grandmother    Diabetes Paternal Grandfather    Kidney disease Paternal Grandfather    Asthma Son    Colon cancer Neg Hx    Colon polyps Neg Hx    Esophageal cancer Neg Hx    Stomach cancer Neg Hx    Rectal cancer Neg Hx     Past Surgical History:  Procedure Laterality Date   thumb surgery Left    Repairing tendons and nerves   TUBAL LIGATION      ROS: Review of Systems  Constitutional:  Negative for unexpected weight change.  HENT:  Negative for hearing loss.   Eyes:  Negative for visual disturbance.  Respiratory:  Negative for shortness of breath.   Cardiovascular:  Negative for chest pain and leg swelling.  Gastrointestinal:  Negative for abdominal distention and abdominal pain.  Endocrine: Positive for polyuria. Negative for polydipsia.  Neurological:  Positive for headaches. Negative for dizziness, tremors and numbness.    PHYSICAL EXAM: BP (!) 158/87 (BP Location: Left Arm, Patient Position: Sitting, Cuff Size: Large)   Pulse 78   Temp 98 F (36.7 C) (Oral)   Ht 5\' 3"  (1.6 m)   Wt 208 lb (94.3 kg)   SpO2 100%   BMI 36.85 kg/m   Repeat BP 164/93  Physical Exam Constitutional:      Appearance: She is obese. She is not toxic-appearing.  Cardiovascular:     Rate and Rhythm: Normal rate and regular rhythm.     Pulses:          Dorsalis pedis pulses are 2+ on the right side and 2+ on the left side.     Heart sounds: Normal heart sounds.  Pulmonary:     Effort: Pulmonary effort is normal.     Breath sounds: Normal breath sounds.  Musculoskeletal:     Right lower leg: No edema.     Left lower leg: No edema.  Neurological:     Mental Status: She is alert.     Sensory: Sensation is intact.     Diabetic Foot Exam - Simple   Simple Foot Form Diabetic Foot exam was performed with the following findings: Yes 03/23/2023  9:48 AM  Visual Inspection No deformities, no ulcerations, no other skin breakdown bilaterally: Yes Sensation Testing Intact to touch and monofilament testing bilaterally: Yes Pulse Check Posterior Tibialis and Dorsalis pulse intact bilaterally: Yes Comments      A1c 13.8     Latest Ref Rng & Units 11/17/2022    1:35 PM 07/18/2020   11:25 AM 07/21/2018   11:30 AM  CMP  Glucose  70 - 99 mg/dL 161  096  045   BUN 6 - 24 mg/dL 11  9  13    Creatinine 0.57 - 1.00 mg/dL 4.09  8.11  9.14   Sodium 134 - 144 mmol/L 139  138  136   Potassium 3.5 - 5.2 mmol/L 4.2  4.1  4.1   Chloride 96 - 106 mmol/L 103  99  96   CO2 20 - 29 mmol/L 21  25  22    Calcium 8.7 - 10.2 mg/dL 9.3  9.5  9.5   Total Protein 6.0 - 8.5 g/dL 7.2  7.1  7.4   Total Bilirubin 0.0 - 1.2 mg/dL 0.3  0.3  0.3   Alkaline Phos 44 - 121 IU/L 85  99  79   AST 0 - 40 IU/L 16  28  23    ALT 0 - 32 IU/L 28  60  33    Lipid Panel     Component Value Date/Time   CHOL 230 (H) 11/17/2022 1335   TRIG 127 11/17/2022 1335   HDL 66 11/17/2022 1335   CHOLHDL 3.5 11/17/2022 1335   CHOLHDL 4.0 11/07/2015 1407   VLDL 45 (H) 11/07/2015 1407   LDLCALC 142 (H) 11/17/2022 1335    CBC    Component Value Date/Time   WBC 8.2 11/17/2022 1335   WBC 10.1 11/07/2015 1407   RBC 6.18 (H) 11/17/2022 1335   RBC 5.74 (H) 11/07/2015 1407   HGB 13.6 11/17/2022 1335   HCT 46.3 11/17/2022 1335   PLT 355 11/17/2022 1335   MCV 75 (L) 11/17/2022 1335   MCH 22.0 (L) 11/17/2022 1335   MCH 23.2 (L) 11/07/2015 1407   MCHC 29.4 (L) 11/17/2022 1335   MCHC 30.9 11/07/2015 1407   RDW 15.6 (H) 11/17/2022 1335    ASSESSMENT AND PLAN:  1. Type 2 diabetes mellitus with obesity (HCC) Uncontrolled due to being off medicines for over a year. Restart Lantus 14 units at bedtime. Restart Trulicity at 0.75 mg once a week. Sent  prescription to pharmacy for continuous glucose monitor.  Advised on healthy diet and discussed exercise.  Advised to check blood sugars at least twice a day before meals and bring readings to next visit.  2. Hypertension associated with type 2 diabetes mellitus (HCC) Uncontrolled.  Continue Amlodipine 10 mg daily.  Started on Valsartan 40 mg daily.  Advised her to keep logs of her BP at least 2x a week and bring it to next visit.   3. Hyperlipidemia associated with type 2 diabetes mellitus (HCC) Restart Atorvastatin 10mg  daily.   4. Stressful life event affecting family She is still the sole caretaker of her mother of which she notes causes significant stress and prevents her from taking care of her health as she desires.  Requested referral to therapist, she will call back with the name.   5. Screening for colon cancer She has a colonoscopy scheduled for May 06 2023, and will bring someone to accompany her.   Patient was given the opportunity to ask questions.  Patient verbalized understanding of the plan and was able to repeat key elements of the plan.    Requested Prescriptions   Signed Prescriptions Disp Refills   insulin glargine (LANTUS SOLOSTAR) 100 UNIT/ML Solostar Pen 15 mL 5    Sig: Inject 14 Units into the skin daily.   Dulaglutide (TRULICITY) 0.75 MG/0.5ML SOPN 2 mL 4    Sig: Inject 0.75 mg into the skin once a week.  amLODipine (NORVASC) 10 MG tablet 90 tablet 1    Sig: Take 1 tablet (10 mg total) by mouth daily.   atorvastatin (LIPITOR) 10 MG tablet 90 tablet 1    Sig: Take 1 tablet (10 mg total) by mouth daily.   Insulin Pen Needle (PEN NEEDLES) 31G X 8 MM MISC 100 each 6    Sig: UAD   Continuous Glucose Receiver (FREESTYLE LIBRE 3 READER) DEVI 1 each 0    Sig: 1 Device by Does not apply route daily.   Continuous Glucose Sensor (FREESTYLE LIBRE 3 SENSOR) MISC 2 each 6    Sig: Change sensor Q 2 wks   valsartan (DIOVAN) 40 MG tablet 90 tablet 3    Sig:  Take 1 tablet (40 mg total) by mouth daily.    Return in about 1 month (around 04/23/2023) for Sign release to get eye exam from [facility].

## 2023-03-23 NOTE — Patient Instructions (Signed)
Restart plan to use insulin 14 units at bedtime. Restart Trulicity at 0.75 mg once a week. We have sent a prescription to your pharmacy for continuous glucose monitor. Continue amlodipine 10 mg daily.  We have added another blood pressure medication called valsartan 40 mg daily.  Please check blood pressure at least twice a week and record your readings.  Bring with you on your next visit.

## 2023-03-24 ENCOUNTER — Other Ambulatory Visit: Payer: Self-pay | Admitting: Internal Medicine

## 2023-03-24 DIAGNOSIS — Z6379 Other stressful life events affecting family and household: Secondary | ICD-10-CM

## 2023-03-24 LAB — BASIC METABOLIC PANEL
BUN/Creatinine Ratio: 13 (ref 9–23)
BUN: 9 mg/dL (ref 6–24)
CO2: 24 mmol/L (ref 20–29)
Calcium: 9.6 mg/dL (ref 8.7–10.2)
Chloride: 100 mmol/L (ref 96–106)
Creatinine, Ser: 0.7 mg/dL (ref 0.57–1.00)
Glucose: 317 mg/dL — ABNORMAL HIGH (ref 70–99)
Potassium: 4 mmol/L (ref 3.5–5.2)
Sodium: 137 mmol/L (ref 134–144)
eGFR: 107 mL/min/{1.73_m2} (ref >59)

## 2023-03-24 LAB — MICROALBUMIN / CREATININE URINE RATIO
Creatinine, Urine: 79.4 mg/dL
Microalb/Creat Ratio: 11 mg/g creat (ref 0–29)
Microalbumin, Urine: 9.1 ug/mL

## 2023-04-01 ENCOUNTER — Encounter: Payer: Self-pay | Admitting: Internal Medicine

## 2023-04-01 ENCOUNTER — Other Ambulatory Visit: Payer: Self-pay | Admitting: Internal Medicine

## 2023-04-01 DIAGNOSIS — E1169 Type 2 diabetes mellitus with other specified complication: Secondary | ICD-10-CM

## 2023-04-01 DIAGNOSIS — Z794 Long term (current) use of insulin: Secondary | ICD-10-CM

## 2023-04-01 MED ORDER — ACCU-CHEK GUIDE VI STRP
ORAL_STRIP | 12 refills | Status: AC
Start: 2023-04-01 — End: ?

## 2023-04-01 MED ORDER — ACCU-CHEK GUIDE W/DEVICE KIT
PACK | 0 refills | Status: AC
Start: 2023-04-01 — End: ?

## 2023-04-01 MED ORDER — ACCU-CHEK SOFTCLIX LANCETS MISC
12 refills | Status: AC
Start: 2023-04-01 — End: ?

## 2023-04-08 ENCOUNTER — Other Ambulatory Visit (HOSPITAL_COMMUNITY): Payer: Self-pay

## 2023-04-08 ENCOUNTER — Telehealth: Payer: Self-pay

## 2023-04-08 NOTE — Telephone Encounter (Signed)
Pharmacy Patient Advocate Encounter   Received notification from  PROVIDER  that prior authorization for [facility] is required/requested.   Per test claim: PA submitted to [employer] via CoverMyMeds Key/confirmation #/EOC [id number] Status is pending

## 2023-04-08 NOTE — Telephone Encounter (Signed)
-----   Message from Brittany [name] sent at 04/07/2023  1:54 PM EDT ----- Not sure if this was done already, but I received a message from [facility] pharmacy stating that prior approval is needed for Trulicity on this patient.

## 2023-04-13 NOTE — Telephone Encounter (Signed)
Pharmacy Patient Advocate Encounter  Received notification from [facility] that Prior Authorization for Ozempic has been APPROVED from 04/12/23 to 04/11/24.Marland Kitchen  PA #/Case ID/Reference #: [id number]

## 2023-04-15 ENCOUNTER — Other Ambulatory Visit: Payer: Self-pay

## 2023-04-23 ENCOUNTER — Encounter: Payer: Self-pay | Admitting: Obstetrics & Gynecology

## 2023-04-25 ENCOUNTER — Other Ambulatory Visit: Payer: Self-pay | Admitting: Obstetrics & Gynecology

## 2023-04-25 DIAGNOSIS — G4700 Insomnia, unspecified: Secondary | ICD-10-CM

## 2023-04-28 ENCOUNTER — Encounter: Payer: Self-pay | Admitting: Internal Medicine

## 2023-05-03 ENCOUNTER — Telehealth (HOSPITAL_BASED_OUTPATIENT_CLINIC_OR_DEPARTMENT_OTHER): Payer: BC Managed Care – PPO | Admitting: Internal Medicine

## 2023-05-03 DIAGNOSIS — F5104 Psychophysiologic insomnia: Secondary | ICD-10-CM

## 2023-05-03 MED ORDER — TRAZODONE HCL 50 MG PO TABS
50.0000 mg | ORAL_TABLET | Freq: Every day | ORAL | 4 refills | Status: DC
Start: 2023-05-03 — End: 2023-10-18

## 2023-05-03 NOTE — Progress Notes (Signed)
Virtual Visit via Video Note  I connected with Brittany [name] on 05/03/2023 at 8:11 AM by a video enabled telemedicine application and verified that I am speaking with the correct person using two identifiers.  Location: Patient: home Provider: Office   I discussed the limitations of evaluation and management by telemedicine and the availability of in person appointments. The patient expressed understanding and agreed to proceed.  History of Present Illness: Her concerns today include: Patient with history of HTN, DM, obesity, depression, PTSD and HLD   This is an urgent care visit for request for medication trazodone. Patient had sent a MyChart message last week requesting this medication. Reports that she has been on it off and on since 2018 through her gynecologist to help with insomnia. Dose is 50 mg half to 1 tab at bedtime.  She has been taking the full 50 mg tablet.  Has 3 left.  She had called her gynecologist for refills but her appointment with the gynecologist is not until the 23rd of this month. Denies any significant side effects from the medication.  No daytime sleepiness or feeling hung over. Bedtime varies as she takes care of her mom.  Tries to get in bed no later than 10 PM to 10:30 PM.  She turns off all lights and sounds.  No problems falling asleep but may wake up in the middle of the night.  Denies drinking any caffeinated beverages in the evenings.  She does not drink alcoholic beverages.  Outpatient Encounter Medications as of 05/03/2023  Medication Sig   Accu-Chek Softclix Lancets lancets Use as instructed to check BS 3x/day   amLODipine (NORVASC) 10 MG tablet Take 1 tablet (10 mg total) by mouth daily.   atorvastatin (LIPITOR) 10 MG tablet Take 1 tablet (10 mg total) by mouth daily.   Blood Glucose Monitoring Suppl (ACCU-CHEK GUIDE) w/Device KIT Use to check BS 3x/day   Continuous Glucose Receiver (FREESTYLE LIBRE 3 READER) DEVI 1 Device by Does not apply route  daily.   Continuous Glucose Sensor (FREESTYLE LIBRE 3 SENSOR) MISC Change sensor Q 2 wks   Dulaglutide (TRULICITY) 0.75 MG/0.5ML SOPN Inject 0.75 mg into the skin once a week.   glucose blood (ACCU-CHEK GUIDE) test strip Use as instructed to check BS 3x/day   insulin glargine (LANTUS SOLOSTAR) 100 UNIT/ML Solostar Pen Inject 14 Units into the skin daily.   Insulin Pen Needle (PEN NEEDLES) 31G X 8 MM MISC UAD   valsartan (DIOVAN) 40 MG tablet Take 1 tablet (40 mg total) by mouth daily.   No facility-administered encounter medications on file as of 05/03/2023.      Observations/Objective: Middle-age African-American female in NAD.  Assessment and Plan: 1. Chronic insomnia Good sleep hygiene discussed and encouraged. Patient advised not to drink any caffeinated beverages or excessive alcohol use within several hours of bedtime.  Advised to get in bed around about the same time every night.  Once in bed, turn off all lights and sounds.  If unable to fall asleep within 30 to 45 minutes of getting in bed, patient should get up and try to do something until she feels sleepy again.  At that time try getting back in bed. Refills sent on trazodone. - traZODone (DESYREL) 50 MG tablet; Take 1 tablet (50 mg total) by mouth at bedtime.  Dispense: 30 tablet; Refill: 4   Follow Up Instructions: As previously scheduled.   I discussed the assessment and treatment plan with the patient. The patient was provided an  opportunity to ask questions and all were answered. The patient agreed with the plan and demonstrated an understanding of the instructions.   The patient was advised to call back or seek an in-person evaluation if the symptoms worsen or if the condition fails to improve as anticipated.  I spent 9 minutes dedicated to the care of this patient on the date of this encounter to include previsit review of of patient's MyChart message, face-to-face time with patient discussing diagnosis and management  and post visit entering of order.  This note has been created with Education officer, environmental. Any transcriptional errors are unintentional.  Jonah Blue, MD

## 2023-05-05 ENCOUNTER — Telehealth: Payer: Self-pay | Admitting: Internal Medicine

## 2023-05-05 NOTE — Telephone Encounter (Signed)
Patient calling to cancel procedure due to her menstrual cycle coming on. Patient will call back to reschedule

## 2023-05-06 ENCOUNTER — Encounter: Payer: BC Managed Care – PPO | Admitting: Internal Medicine

## 2023-05-20 ENCOUNTER — Ambulatory Visit: Payer: BC Managed Care – PPO | Admitting: Obstetrics & Gynecology

## 2023-07-11 ENCOUNTER — Other Ambulatory Visit: Payer: Self-pay | Admitting: Internal Medicine

## 2023-07-11 DIAGNOSIS — F5104 Psychophysiologic insomnia: Secondary | ICD-10-CM

## 2023-07-13 NOTE — Telephone Encounter (Signed)
Requested Prescriptions  Refused Prescriptions Disp Refills   traZODone (DESYREL) 50 MG tablet [Pharmacy Med Name: TRAZODONE 50 MG TABLET] 90 tablet 2    Sig: TAKE 1 TABLET BY MOUTH EVERYDAY AT BEDTIME     Psychiatry: Antidepressants - Serotonin Modulator Passed - 07/11/2023  1:36 PM      Passed - Valid encounter within last 6 months    Recent Outpatient Visits           2 months ago Chronic insomnia   [facility] Jonah Blue B, MD   3 months ago Type 2 diabetes mellitus with obesity [facility])   [place] [facility] Marcine Matar, MD   2 years ago Encounter for medication counseling   [facility] [place], [place] L, RPH-CPP   2 years ago Type 2 diabetes mellitus with obesity [facility])   [facility] Jonah Blue B, MD   4 years ago Controlled type 2 diabetes mellitus without complication, without long-term current use of insulin [facility])   [place] Marcine Matar, MD

## 2023-08-05 ENCOUNTER — Ambulatory Visit: Payer: BC Managed Care – PPO | Admitting: Obstetrics & Gynecology

## 2023-08-05 ENCOUNTER — Encounter: Payer: Self-pay | Admitting: Obstetrics & Gynecology

## 2023-08-05 VITALS — BP 169/103 | HR 93 | Wt 210.0 lb

## 2023-08-05 DIAGNOSIS — N951 Menopausal and female climacteric states: Secondary | ICD-10-CM

## 2023-08-05 DIAGNOSIS — N946 Dysmenorrhea, unspecified: Secondary | ICD-10-CM | POA: Diagnosis not present

## 2023-08-05 MED ORDER — VENLAFAXINE HCL ER 75 MG PO CP24: 75.0000 mg | ORAL_CAPSULE | Freq: Every day | ORAL | 3 refills | Status: DC

## 2023-08-05 NOTE — Progress Notes (Signed)
GYNECOLOGY OFFICE VISIT NOTE  History:   Brittany [name] is a 47 y.o. [phone] here today for discussion about concerning symptoms that she attributes to perimenopause. Reports hair loss with thinning at the top, worsening insomnia, mood swings, irritability. Has history of depression and anxiety, takes Trazodone at night to help sometimes with her insomnia.  Also reports having painful, regular periods.  She denies any abnormal vaginal discharge, bleeding  or other concerns.    Past Medical History:  Diagnosis Date   Anxiety    Depression    Diabetes mellitus without complication (HCC)    HLD (hyperlipidemia) 01/02/2014   Hypertension UNSURE    Past Surgical History:  Procedure Laterality Date   thumb surgery Left    Repairing tendons and nerves   TUBAL LIGATION      The following portions of the patient's history were reviewed and updated as appropriate: allergies, current medications, past family history, past medical history, past social history, past surgical history and problem list.   Health Maintenance:  Normal pap and negative HRHPV on 11/17/2022.  Normal mammogram on 10/21/2022.   Review of Systems:  Pertinent items noted in HPI and remainder of comprehensive ROS otherwise negative.  Physical Exam:  BP (!) 169/103   Pulse 93   Wt 210 lb (95.3 kg)   LMP 07/27/2023   BMI 37.20 kg/m  Vitals:   08/05/23 1558 08/05/23 1619  BP: (!) 164/85 (!) 169/103   CONSTITUTIONAL: Well-developed, well-nourished female in no acute distress.  HEENT:  Normocephalic, atraumatic. External right and left ear normal. No scleral icterus.  NECK: Normal range of motion, supple, no masses noted on observation SKIN: No rash noted. Not diaphoretic. No erythema. No pallor. MUSCULOSKELETAL: Normal range of motion. No edema noted. NEUROLOGIC: Alert and oriented to person, place, and time. Normal muscle tone coordination. No cranial nerve deficit noted. PSYCHIATRIC: Normal mood and affect.  Normal behavior. Normal judgment and thought content. CARDIOVASCULAR: Normal heart rate noted RESPIRATORY: Effort and breath sounds normal, no problems with respiration noted ABDOMEN: No masses noted. No other overt distention noted.   PELVIC: Deferred    Assessment and Plan:     1. Dysmenorrhea Unsure etiology for this, will get ultrasound for evaluation. Will follow up results and manage accordingly. - US PELVIC COMPLETE WITH TRANSVAGINAL; Future  2. Perimenopausal symptoms Discussed perimenopause and associated vasomotor symptoms.  For management, discussed using hormone therapy and concerns about increased risk of heart disease (especially given her HTN), cerebrovascular disease, thromboembolic disease,  and breast cancer.    Also discussed alternative therapies such as herbal remedies but cautioned that most of the products contained phytoestrogens (plant estrogens) in unregulated amounts which can have the same effects on the body as the pharmaceutical estrogen preparations.  Also discussed other medical options such as Paxil, Effexor, Brisdelle or Neurontin.  Patient opted for Effexor for now, also this will help her history of depression and anxiety.  She will return in 2 months for reevaluation. Of note, patient will follow up with Dermatology to evaluate and manage her hair loss. - venlafaxine XR (EFFEXOR-XR) 75 MG 24 hr capsule; Take 1 capsule (75 mg total) by mouth daily.  Dispense: 30 capsule; Refill: 3   Routine preventative health maintenance measures emphasized. Please refer to After Visit Summary for other counseling recommendations.   Return in about 2 months (around 10/05/2023) for Follow up perimenopausal symptoms.    I spent 30 minutes dedicated to the care of this patient including pre-visit review  of records, face to face time with the patient discussing her conditions and treatments and post visit orders.    Jaynie Collins, MD, FACOG Obstetrician & Gynecologist,  [facility] for [employer], [facility]

## 2023-08-30 ENCOUNTER — Other Ambulatory Visit: Payer: Self-pay | Admitting: Obstetrics & Gynecology

## 2023-08-30 DIAGNOSIS — N951 Menopausal and female climacteric states: Secondary | ICD-10-CM

## 2023-09-02 ENCOUNTER — Ambulatory Visit (HOSPITAL_COMMUNITY): Payer: BC Managed Care – PPO

## 2023-09-04 ENCOUNTER — Ambulatory Visit (HOSPITAL_BASED_OUTPATIENT_CLINIC_OR_DEPARTMENT_OTHER): Payer: BC Managed Care – PPO

## 2023-09-13 ENCOUNTER — Other Ambulatory Visit: Payer: Self-pay | Admitting: Internal Medicine

## 2023-09-13 DIAGNOSIS — E1169 Type 2 diabetes mellitus with other specified complication: Secondary | ICD-10-CM

## 2023-10-13 ENCOUNTER — Other Ambulatory Visit: Payer: Self-pay | Admitting: Internal Medicine

## 2023-10-13 DIAGNOSIS — I152 Hypertension secondary to endocrine disorders: Secondary | ICD-10-CM

## 2023-10-17 ENCOUNTER — Other Ambulatory Visit: Payer: Self-pay | Admitting: Internal Medicine

## 2023-10-17 DIAGNOSIS — F5104 Psychophysiologic insomnia: Secondary | ICD-10-CM

## 2023-10-18 NOTE — Telephone Encounter (Signed)
Requested by interface surescripts.  Requested Prescriptions  Pending Prescriptions Disp Refills   traZODone (DESYREL) 50 MG tablet [Pharmacy Med Name: TRAZODONE 50 MG TABLET] 30 tablet 0    Sig: TAKE 1 TABLET BY MOUTH EVERYDAY AT BEDTIME     Psychiatry: Antidepressants - Serotonin Modulator Passed - 10/18/2023  3:40 PM      Passed - Valid encounter within last 6 months    Recent Outpatient Visits           5 months ago Chronic insomnia   [facility] [facility] [place] [facility] [place]. [facility] Jonah Blue B, MD   6 months ago Type 2 diabetes mellitus with obesity [facility])   [place] [facility] [place] [place]. [facility] Marcine Matar, MD   3 years ago Encounter for medication counseling   [place] [facility] [place] [facility] [place]. [facility] Lois Hux[place], [place] L, RPH-CPP   3 years ago Type 2 diabetes mellitus with obesity [facility])   [place] [facility] [place]. [facility] Jonah Blue B, MD   5 years ago Controlled type 2 diabetes mellitus without complication, without long-term current use of insulin (HCC)   [place] [facility] [place] [facility] Of . [facility] Marcine Matar, MD

## 2023-11-13 ENCOUNTER — Other Ambulatory Visit: Payer: Self-pay | Admitting: Internal Medicine

## 2023-11-13 DIAGNOSIS — F5104 Psychophysiologic insomnia: Secondary | ICD-10-CM

## 2023-11-17 ENCOUNTER — Other Ambulatory Visit: Payer: Self-pay | Admitting: Internal Medicine

## 2023-11-17 DIAGNOSIS — F5104 Psychophysiologic insomnia: Secondary | ICD-10-CM

## 2023-11-17 NOTE — Telephone Encounter (Signed)
 Requested medication (s) are due for refill today: Yes  Requested medication (s) are on the active medication list: Yes  Last refill:  10/18/23  Future visit scheduled: No  Notes to clinic:  Unable to leave pt. A message to make an appointment.    Requested Prescriptions  Pending Prescriptions Disp Refills   traZODone (DESYREL) 50 MG tablet [Pharmacy Med Name: TRAZODONE 50 MG TABLET] 30 tablet 0    Sig: TAKE 1 TABLET BY MOUTH EVERYDAY AT BEDTIME     Psychiatry: Antidepressants - Serotonin Modulator Failed - 11/17/2023  3:13 PM      Failed - Valid encounter within last 6 months    Recent Outpatient Visits           6 months ago Chronic insomnia   [place] [facility] [place] [facility] [place] Jonah Blue B, MD   7 months ago Type 2 diabetes mellitus with obesity [facility])   [place] [facility] [place]. [facility] Marcine Matar, MD   3 years ago Encounter for medication counseling   [place] [facility] [place] [facility] [place]. [facility] Lois Huxley, [place] L, RPH-CPP   3 years ago Type 2 diabetes mellitus with obesity [facility])   [place] [facility] [place]. [facility] Jonah Blue B, MD   5 years ago Controlled type 2 diabetes mellitus without complication, without long-term current use of insulin (HCC)   [place] [facility] [place] - [facility] Of [place]. [facility] Marcine Matar, MD

## 2023-12-08 ENCOUNTER — Ambulatory Visit: Payer: BC Managed Care – PPO

## 2023-12-08 DIAGNOSIS — Z1231 Encounter for screening mammogram for malignant neoplasm of breast: Secondary | ICD-10-CM | POA: Diagnosis not present

## 2023-12-10 ENCOUNTER — Encounter: Payer: Self-pay | Admitting: Internal Medicine

## 2024-01-16 ENCOUNTER — Other Ambulatory Visit: Payer: Self-pay | Admitting: Internal Medicine

## 2024-01-16 DIAGNOSIS — E1159 Type 2 diabetes mellitus with other circulatory complications: Secondary | ICD-10-CM

## 2024-01-21 ENCOUNTER — Telehealth: Payer: Self-pay

## 2024-01-21 NOTE — Telephone Encounter (Signed)
 Patient was identified as falling into the [place] Measure - Diabetes.   Patient was: Left voicemail to schedule with primary care provider.

## 2024-02-12 ENCOUNTER — Other Ambulatory Visit: Payer: Self-pay | Admitting: Internal Medicine

## 2024-02-12 DIAGNOSIS — F5104 Psychophysiologic insomnia: Secondary | ICD-10-CM

## 2024-03-13 ENCOUNTER — Other Ambulatory Visit: Payer: Self-pay | Admitting: Internal Medicine

## 2024-03-13 DIAGNOSIS — F5104 Psychophysiologic insomnia: Secondary | ICD-10-CM

## 2024-03-17 ENCOUNTER — Other Ambulatory Visit: Payer: Self-pay | Admitting: Internal Medicine

## 2024-03-17 DIAGNOSIS — F5104 Psychophysiologic insomnia: Secondary | ICD-10-CM

## 2024-03-25 ENCOUNTER — Other Ambulatory Visit: Payer: Self-pay | Admitting: Internal Medicine

## 2024-03-25 DIAGNOSIS — E1159 Type 2 diabetes mellitus with other circulatory complications: Secondary | ICD-10-CM

## 2024-03-25 DIAGNOSIS — E1169 Type 2 diabetes mellitus with other specified complication: Secondary | ICD-10-CM

## 2024-05-09 ENCOUNTER — Encounter: Payer: Self-pay | Admitting: Obstetrics & Gynecology

## 2024-05-09 ENCOUNTER — Ambulatory Visit (INDEPENDENT_AMBULATORY_CARE_PROVIDER_SITE_OTHER): Admitting: Obstetrics & Gynecology

## 2024-05-09 ENCOUNTER — Other Ambulatory Visit (HOSPITAL_COMMUNITY)
Admission: RE | Admit: 2024-05-09 | Discharge: 2024-05-09 | Disposition: A | Discharge status: Home / Self Care | Source: Ambulatory Visit | Attending: Obstetrics & Gynecology | Admitting: Obstetrics & Gynecology

## 2024-05-09 VITALS — BP 176/109 | HR 93 | Ht 63.0 in | Wt 217.4 lb

## 2024-05-09 DIAGNOSIS — Z01419 Encounter for gynecological examination (general) (routine) without abnormal findings: Secondary | ICD-10-CM | POA: Diagnosis not present

## 2024-05-09 DIAGNOSIS — N951 Menopausal and female climacteric states: Secondary | ICD-10-CM

## 2024-05-09 DIAGNOSIS — Z1211 Encounter for screening for malignant neoplasm of colon: Secondary | ICD-10-CM

## 2024-05-09 DIAGNOSIS — N924 Excessive bleeding in the premenopausal period: Secondary | ICD-10-CM

## 2024-05-09 MED ORDER — NORETHINDRONE ACETATE 5 MG PO TABS: 10.0000 mg | ORAL_TABLET | Freq: Every day | ORAL | 2 refills | Status: AC

## 2024-05-09 MED ORDER — GABAPENTIN 600 MG PO TABS: 600.0000 mg | ORAL_TABLET | Freq: Every day | ORAL | 3 refills | Status: AC

## 2024-05-09 NOTE — Progress Notes (Signed)
 GYNECOLOGY ANNUAL PREVENTATIVE CARE ENCOUNTER NOTE  History:    Brittany [name] is a 48 y.o. G11P2002 female here for a routine annual gynecologic exam.  Current complaints: continued perimenopausal vasomotor symptoms, not controlled on Effexor .  Still having heavy regular periods too. Wants to discuss hormonal therapy, interested in Mirena.   Denies abnormal vaginal discharge, pelvic pain, problems with intercourse or other gynecologic concerns.  Gynecologic History Patient's last menstrual period was 04/26/2024 (exact date). Contraception: tubal ligation Last Pap: 11/17/2022. Result was normal with negative HPV Last Mammogram: 12/08/2023.  Result was normal Last Colonoscopy: Never had any colon cancer screening  Obstetric History OB History  Gravida Para Term Preterm AB Living  2 2 2   2   SAB IAB Ectopic Multiple Live Births      2    # Outcome Date GA Lbr Len/2nd Weight Sex Type Anes PTL Lv  2 Term 12/30/00    M Vag-Spont   LIV  1 Term 12/17/95    CHRISTELLA Pitman   LIV    Past Medical History:  Diagnosis Date   Anxiety    Depression    Diabetes mellitus without complication (HCC)    HLD (hyperlipidemia) 01/02/2014   Hypertension UNSURE    Past Surgical History:  Procedure Laterality Date   thumb surgery Left    Repairing tendons and nerves   TUBAL LIGATION      Current Outpatient Medications on File Prior to Visit  Medication Sig Dispense Refill   Accu-Chek Softclix Lancets lancets Use as instructed to check BS 3x/day 100 each 12   amLODipine  (NORVASC ) 10 MG tablet Take 1 tablet (10 mg total) by mouth daily. Please schedule PCP visit for additional refills. 30 tablet 0   atorvastatin  (LIPITOR) 10 MG tablet TAKE 1 TABLET BY MOUTH EVERY DAY 90 tablet 1   Blood Glucose Monitoring Suppl (ACCU-CHEK GUIDE) w/Device KIT Use to check BS 3x/day 1 kit 0   Continuous Glucose Receiver (FREESTYLE LIBRE 3 READER) DEVI 1 Device by Does not apply route daily. 1 each 0   Continuous  Glucose Sensor (FREESTYLE LIBRE 3 SENSOR) MISC Change sensor Q 2 wks 2 each 6   glucose blood (ACCU-CHEK GUIDE) test strip Use as instructed to check BS 3x/day 100 each 12   insulin  glargine (LANTUS  SOLOSTAR) 100 UNIT/ML Solostar Pen Inject 14 Units into the skin daily. 15 mL 5   Insulin  Pen Needle (PEN NEEDLES) 31G X 8 MM MISC UAD 100 each 6   traZODone  (DESYREL ) 50 MG tablet Take 1 tablet (50 mg total) by mouth at bedtime. NEEDS TO BE SEEN PRIOR TO NEXT REFILL REQUEST. 30 tablet 0   valsartan  (DIOVAN ) 40 MG tablet Take 1 tablet (40 mg total) by mouth daily. 90 tablet 3   venlafaxine  XR (EFFEXOR -XR) 75 MG 24 hr capsule TAKE 1 CAPSULE BY MOUTH EVERY DAY 90 capsule 2   Dulaglutide  (TRULICITY ) 0.75 MG/0.5ML SOPN Inject 0.75 mg into the skin once a week. (Patient not taking: Reported on 05/09/2024) 2 mL 4   No current facility-administered medications on file prior to visit.    Allergies  Allergen Reactions   Metformin  And Related Diarrhea   Zoloft  [Sertraline  Hcl] Palpitations and Other (See Comments)    Headache    Social History:  reports that she has never smoked. She has never used smokeless tobacco. She reports that she does not drink alcohol and does not use drugs.  Family History  Problem Relation Age of Onset  Diabetes Mother    Hyperlipidemia Mother    Diabetes Father    Schizophrenia Maternal Grandmother    Diabetes Paternal Grandmother    Diabetes Paternal Grandfather    Kidney disease Paternal Grandfather    Asthma Son    Colon cancer Neg Hx    Colon polyps Neg Hx    Esophageal cancer Neg Hx    Stomach cancer Neg Hx    Rectal cancer Neg Hx     The following portions of the patient's history were reviewed and updated as appropriate: allergies, current medications, past family history, past medical history, past social history, past surgical history and problem list.  Review of Systems Pertinent items noted in HPI and remainder of comprehensive ROS otherwise  negative.  Physical Exam:  BP (!) 176/109 (BP Location: Right Arm, Patient Position: Sitting, Cuff Size: Large)   Pulse 93   Ht 5' 3 (1.6 m)   Wt 217 lb 6.4 oz (98.6 kg)   LMP 04/26/2024 (Exact Date)   BMI 38.51 kg/m  CONSTITUTIONAL: Well-developed, well-nourished female in no acute distress.  HENT:  Normocephalic, atraumatic, External right and left ear normal.  EYES: Conjunctivae and EOM are normal. Pupils are equal, round, and reactive to light. No scleral icterus.  NECK: Normal range of motion, supple, no masses observed. SKIN: Skin is warm and dry. No rash noted. Not diaphoretic. No erythema. No pallor. MUSCULOSKELETAL: Normal range of motion. No tenderness.  No cyanosis, clubbing, or edema. NEUROLOGIC: Alert and oriented to person, place, and time. Normal muscle tone coordination.  PSYCHIATRIC: Normal mood and affect. Normal behavior. Normal judgment and thought content. CARDIOVASCULAR: Normal heart rate noted, regular rhythm RESPIRATORY: Clear to auscultation bilaterally. Effort and breath sounds normal, no problems with respiration noted. BREASTS: Symmetric in size. No masses, tenderness, skin changes, nipple drainage, or lymphadenopathy bilaterally. Performed in the presence of a chaperone. ABDOMEN: Soft, no distention noted.  No tenderness, rebound or guarding.  PELVIC:  Normal appearing external genitalia and urethral meatus; normal appearing vaginal mucosa and cervix.  Normal discharge noted. Pap smear obtained.  Unable to palpate uterus or adnexa well secondary to habitus, but no tenderness on bimanual exam.  Performed in the presence of a chaperone.   Assessment and Plan:     1. Colon cancer screening Discussed need for colon cancer screening over the age 40. Colonoscopy recommended as this is the gold standard.  Patient is unable to do colonoscopy as she does not have someone that can stay with her, so discussed Cologuard.   Emphasized that positive Cologuard tests will  need to be follow up with diagnostic colonoscopy. Cologuard ordered  - Cologuard  2. Perimenopausal symptoms Discussed other treatment modalities, not an ideal candidate for estrogen given her HTN.  Discussed Mirena, risks and benefits reviewed, she does not want to do this for now.  Interested in oral progestin therapy.  Will try Aygestin  and Neurontin , monitor effect. - norethindrone  (AYGESTIN ) 5 MG tablet; Take 2 tablets (10 mg total) by mouth daily.  Dispense: 60 tablet; Refill: 2 - gabapentin  (NEURONTIN ) 600 MG tablet; Take 1 tablet (600 mg total) by mouth at bedtime.  Dispense: 30 tablet; Refill: 3  3. Abnormal perimenopausal bleeding Aygestin  will also help with this.  - norethindrone  (AYGESTIN ) 5 MG tablet; Take 2 tablets (10 mg total) by mouth daily.  Dispense: 60 tablet; Refill: 2  4. Well woman exam with routine gynecological exam (Primary) - Cytology - PAP Will follow up results of pap smear and  manage accordingly. Mammogram is up to date. Routine preventative health maintenance measures emphasized. Please refer to After Visit Summary for other counseling recommendations.      GLORIS HUGGER, MD, FACOG Obstetrician & Gynecologist, [facility] for [employer], [facility]

## 2024-05-12 ENCOUNTER — Ambulatory Visit: Payer: Self-pay | Admitting: Obstetrics & Gynecology

## 2024-05-12 LAB — CYTOLOGY - PAP
Comment: NEGATIVE
Diagnosis: NEGATIVE
High risk HPV: NEGATIVE

## 2024-05-16 ENCOUNTER — Other Ambulatory Visit: Payer: Self-pay | Admitting: Obstetrics & Gynecology

## 2024-05-16 DIAGNOSIS — N951 Menopausal and female climacteric states: Secondary | ICD-10-CM

## 2024-07-31 DIAGNOSIS — Z1211 Encounter for screening for malignant neoplasm of colon: Secondary | ICD-10-CM | POA: Diagnosis not present

## 2024-08-07 LAB — COLOGUARD: COLOGUARD: NEGATIVE

## 2024-08-15 ENCOUNTER — Encounter: Admitting: Internal Medicine

## 2024-10-03 ENCOUNTER — Ambulatory Visit: Admitting: Internal Medicine

## 2024-11-02 ENCOUNTER — Ambulatory Visit: Admitting: Internal Medicine
